# Patient Record
Sex: Female | Born: 1970
Health system: Southern US, Community
[De-identification: ages and names within clinical notes are randomized; demographics above are authoritative.]

## PROBLEM LIST (undated history)

## (undated) DIAGNOSIS — K645 Perianal venous thrombosis: Secondary | ICD-10-CM

## (undated) DIAGNOSIS — E785 Hyperlipidemia, unspecified: Secondary | ICD-10-CM

## (undated) DIAGNOSIS — M1990 Unspecified osteoarthritis, unspecified site: Secondary | ICD-10-CM

## (undated) DIAGNOSIS — E559 Vitamin D deficiency, unspecified: Principal | ICD-10-CM

## (undated) DIAGNOSIS — K601 Chronic anal fissure: Secondary | ICD-10-CM

## (undated) DIAGNOSIS — E039 Hypothyroidism, unspecified: Secondary | ICD-10-CM

## (undated) DIAGNOSIS — K589 Irritable bowel syndrome without diarrhea: Secondary | ICD-10-CM

## (undated) DIAGNOSIS — N3010 Interstitial cystitis (chronic) without hematuria: Secondary | ICD-10-CM

## (undated) DIAGNOSIS — K5901 Slow transit constipation: Secondary | ICD-10-CM

## (undated) DIAGNOSIS — T7840XA Allergy, unspecified, initial encounter: Secondary | ICD-10-CM

## (undated) DIAGNOSIS — T4145XA Adverse effect of unspecified anesthetic, initial encounter: Secondary | ICD-10-CM

## (undated) DIAGNOSIS — E161 Other hypoglycemia: Secondary | ICD-10-CM

## (undated) DIAGNOSIS — D3130 Benign neoplasm of unspecified choroid: Secondary | ICD-10-CM

## (undated) DIAGNOSIS — G43909 Migraine, unspecified, not intractable, without status migrainosus: Secondary | ICD-10-CM

## (undated) DIAGNOSIS — K523 Indeterminate colitis: Secondary | ICD-10-CM

## (undated) DIAGNOSIS — H3530 Unspecified macular degeneration: Secondary | ICD-10-CM

## (undated) DIAGNOSIS — K219 Gastro-esophageal reflux disease without esophagitis: Secondary | ICD-10-CM

## (undated) DIAGNOSIS — D509 Iron deficiency anemia, unspecified: Secondary | ICD-10-CM

## (undated) DIAGNOSIS — F419 Anxiety disorder, unspecified: Secondary | ICD-10-CM

## (undated) DIAGNOSIS — Z973 Presence of spectacles and contact lenses: Secondary | ICD-10-CM

## (undated) DIAGNOSIS — T8859XA Other complications of anesthesia, initial encounter: Secondary | ICD-10-CM

## (undated) DIAGNOSIS — K5902 Outlet dysfunction constipation: Secondary | ICD-10-CM

## (undated) HISTORY — DX: Benign neoplasm of unspecified choroid: D31.30

## (undated) HISTORY — DX: Allergy, unspecified, initial encounter: T78.40XA

## (undated) HISTORY — DX: Interstitial cystitis (chronic) without hematuria: N30.10

## (undated) HISTORY — DX: Irritable bowel syndrome, unspecified: K58.9

## (undated) HISTORY — DX: Hypothyroidism, unspecified: E03.9

## (undated) HISTORY — DX: Indeterminate colitis: K52.3

## (undated) HISTORY — DX: Outlet dysfunction constipation: K59.02

## (undated) HISTORY — DX: Unspecified macular degeneration: H35.30

## (undated) HISTORY — DX: Migraine, unspecified, not intractable, without status migrainosus: G43.909

## (undated) HISTORY — DX: Vitamin D deficiency, unspecified: E55.9

## (undated) HISTORY — DX: Anxiety disorder, unspecified: F41.9

## (undated) HISTORY — DX: Hyperlipidemia, unspecified: E78.5

## (undated) HISTORY — DX: Iron deficiency anemia, unspecified: D50.9

## (undated) HISTORY — DX: Other hypoglycemia: E16.1

## (undated) HISTORY — DX: Perianal venous thrombosis: K64.5

## (undated) HISTORY — DX: Slow transit constipation: K59.01

---

## 2000-12-29 HISTORY — PX: DILATION AND EVACUATION: SHX1459

## 2001-02-09 ENCOUNTER — Ambulatory Visit (HOSPITAL_COMMUNITY): Admission: RE | Admit: 2001-02-09 | Discharge: 2001-02-09 | Payer: Self-pay | Admitting: *Deleted

## 2001-02-13 ENCOUNTER — Encounter (INDEPENDENT_AMBULATORY_CARE_PROVIDER_SITE_OTHER): Payer: Self-pay

## 2001-02-13 ENCOUNTER — Ambulatory Visit (HOSPITAL_COMMUNITY): Admission: RE | Admit: 2001-02-13 | Discharge: 2001-02-13 | Payer: Self-pay | Admitting: *Deleted

## 2001-07-06 ENCOUNTER — Other Ambulatory Visit: Admission: RE | Admit: 2001-07-06 | Discharge: 2001-07-06 | Payer: Self-pay | Admitting: *Deleted

## 2001-11-05 ENCOUNTER — Ambulatory Visit (HOSPITAL_COMMUNITY): Admission: RE | Admit: 2001-11-05 | Discharge: 2001-11-05 | Payer: Self-pay | Admitting: *Deleted

## 2002-01-27 ENCOUNTER — Inpatient Hospital Stay (HOSPITAL_COMMUNITY): Admission: AD | Admit: 2002-01-27 | Discharge: 2002-01-29 | Payer: Self-pay | Admitting: *Deleted

## 2002-03-07 ENCOUNTER — Other Ambulatory Visit: Admission: RE | Admit: 2002-03-07 | Discharge: 2002-03-07 | Payer: Self-pay | Admitting: *Deleted

## 2002-04-26 ENCOUNTER — Encounter: Payer: Self-pay | Admitting: Family Medicine

## 2002-04-26 ENCOUNTER — Encounter: Admission: RE | Admit: 2002-04-26 | Discharge: 2002-04-26 | Payer: Self-pay | Admitting: Family Medicine

## 2003-03-21 ENCOUNTER — Other Ambulatory Visit: Admission: RE | Admit: 2003-03-21 | Discharge: 2003-03-21 | Payer: Self-pay | Admitting: *Deleted

## 2004-04-18 ENCOUNTER — Other Ambulatory Visit: Admission: RE | Admit: 2004-04-18 | Discharge: 2004-04-18 | Payer: Self-pay | Admitting: *Deleted

## 2005-07-28 ENCOUNTER — Other Ambulatory Visit: Admission: RE | Admit: 2005-07-28 | Discharge: 2005-07-28 | Payer: Self-pay | Admitting: Obstetrics and Gynecology

## 2005-08-20 ENCOUNTER — Emergency Department (HOSPITAL_COMMUNITY): Admission: EM | Admit: 2005-08-20 | Discharge: 2005-08-20 | Payer: Self-pay | Admitting: Family Medicine

## 2005-08-21 ENCOUNTER — Emergency Department (HOSPITAL_COMMUNITY): Admission: EM | Admit: 2005-08-21 | Discharge: 2005-08-21 | Payer: Self-pay | Admitting: Family Medicine

## 2005-08-25 ENCOUNTER — Ambulatory Visit (HOSPITAL_BASED_OUTPATIENT_CLINIC_OR_DEPARTMENT_OTHER): Admission: RE | Admit: 2005-08-25 | Discharge: 2005-08-25 | Payer: Self-pay | Admitting: *Deleted

## 2005-08-25 ENCOUNTER — Ambulatory Visit (HOSPITAL_COMMUNITY): Admission: RE | Admit: 2005-08-25 | Discharge: 2005-08-25 | Payer: Self-pay | Admitting: *Deleted

## 2005-08-25 HISTORY — PX: OTHER SURGICAL HISTORY: SHX169

## 2006-01-27 ENCOUNTER — Ambulatory Visit: Payer: Self-pay | Admitting: Obstetrics & Gynecology

## 2006-02-12 ENCOUNTER — Ambulatory Visit (HOSPITAL_BASED_OUTPATIENT_CLINIC_OR_DEPARTMENT_OTHER): Admission: RE | Admit: 2006-02-12 | Discharge: 2006-02-12 | Payer: Self-pay | Admitting: Orthopedic Surgery

## 2006-02-12 HISTORY — PX: CHEILECTOMY: SHX1336

## 2006-03-18 ENCOUNTER — Ambulatory Visit: Payer: Self-pay | Admitting: Obstetrics & Gynecology

## 2007-04-29 ENCOUNTER — Encounter: Payer: Self-pay | Admitting: Obstetrics & Gynecology

## 2007-04-29 ENCOUNTER — Ambulatory Visit: Payer: Self-pay | Admitting: Obstetrics & Gynecology

## 2008-04-18 ENCOUNTER — Encounter: Payer: Self-pay | Admitting: Obstetrics & Gynecology

## 2008-04-18 ENCOUNTER — Ambulatory Visit: Payer: Self-pay | Admitting: Obstetrics & Gynecology

## 2008-04-25 ENCOUNTER — Ambulatory Visit (HOSPITAL_COMMUNITY): Admission: RE | Admit: 2008-04-25 | Discharge: 2008-04-25 | Payer: Self-pay | Admitting: Gynecology

## 2008-07-06 ENCOUNTER — Ambulatory Visit: Payer: Self-pay | Admitting: Obstetrics & Gynecology

## 2009-10-31 ENCOUNTER — Encounter: Payer: Self-pay | Admitting: Obstetrics & Gynecology

## 2009-10-31 ENCOUNTER — Ambulatory Visit: Payer: Self-pay | Admitting: Obstetrics & Gynecology

## 2009-11-05 ENCOUNTER — Ambulatory Visit (HOSPITAL_COMMUNITY): Admission: RE | Admit: 2009-11-05 | Discharge: 2009-11-05 | Payer: Self-pay | Admitting: Obstetrics & Gynecology

## 2009-11-13 ENCOUNTER — Ambulatory Visit: Payer: Self-pay | Admitting: Obstetrics & Gynecology

## 2009-11-28 ENCOUNTER — Ambulatory Visit: Payer: Self-pay | Admitting: Obstetrics & Gynecology

## 2009-12-03 ENCOUNTER — Ambulatory Visit (HOSPITAL_COMMUNITY): Payer: Self-pay | Admitting: Marriage and Family Therapist

## 2009-12-11 ENCOUNTER — Ambulatory Visit (HOSPITAL_COMMUNITY): Payer: Self-pay | Admitting: Marriage and Family Therapist

## 2009-12-14 ENCOUNTER — Ambulatory Visit: Payer: Self-pay | Admitting: Obstetrics & Gynecology

## 2009-12-20 ENCOUNTER — Ambulatory Visit (HOSPITAL_COMMUNITY): Payer: Self-pay | Admitting: Marriage and Family Therapist

## 2010-01-01 ENCOUNTER — Ambulatory Visit (HOSPITAL_COMMUNITY): Payer: Self-pay | Admitting: Marriage and Family Therapist

## 2010-02-05 ENCOUNTER — Ambulatory Visit (HOSPITAL_COMMUNITY): Payer: Self-pay | Admitting: Marriage and Family Therapist

## 2010-03-04 ENCOUNTER — Ambulatory Visit (HOSPITAL_COMMUNITY): Payer: Self-pay | Admitting: Marriage and Family Therapist

## 2010-03-19 ENCOUNTER — Ambulatory Visit (HOSPITAL_BASED_OUTPATIENT_CLINIC_OR_DEPARTMENT_OTHER): Admission: RE | Admit: 2010-03-19 | Discharge: 2010-03-19 | Payer: Self-pay | Admitting: Urology

## 2010-03-19 HISTORY — PX: OTHER SURGICAL HISTORY: SHX169

## 2010-04-16 ENCOUNTER — Encounter: Admission: RE | Admit: 2010-04-16 | Discharge: 2010-07-03 | Payer: Self-pay | Admitting: Urology

## 2010-04-23 ENCOUNTER — Ambulatory Visit: Payer: Self-pay | Admitting: Obstetrics & Gynecology

## 2010-04-24 ENCOUNTER — Encounter: Payer: Self-pay | Admitting: Obstetrics & Gynecology

## 2010-04-24 LAB — CONVERTED CEMR LAB
Chlamydia, DNA Probe: NEGATIVE
GC Probe Amp, Genital: NEGATIVE

## 2010-07-22 ENCOUNTER — Encounter: Payer: Self-pay | Admitting: Internal Medicine

## 2010-07-23 ENCOUNTER — Encounter: Payer: Self-pay | Admitting: Obstetrics & Gynecology

## 2010-09-11 ENCOUNTER — Ambulatory Visit: Payer: Self-pay | Admitting: Internal Medicine

## 2010-09-11 DIAGNOSIS — K589 Irritable bowel syndrome without diarrhea: Secondary | ICD-10-CM

## 2010-09-11 LAB — CONVERTED CEMR LAB
ALT: 51 units/L — ABNORMAL HIGH (ref 0–35)
AST: 32 units/L (ref 0–37)
Albumin: 3.9 g/dL (ref 3.5–5.2)
Alkaline Phosphatase: 65 units/L (ref 39–117)
BUN: 15 mg/dL (ref 6–23)
Basophils Absolute: 0.1 10*3/uL (ref 0.0–0.1)
Basophils Relative: 1.4 % (ref 0.0–3.0)
CO2: 26 meq/L (ref 19–32)
CRP, High Sensitivity: 0.4 (ref 0.00–5.00)
Calcium: 9.7 mg/dL (ref 8.4–10.5)
Chloride: 111 meq/L (ref 96–112)
Creatinine, Ser: 0.9 mg/dL (ref 0.4–1.2)
Eosinophils Absolute: 0.3 10*3/uL (ref 0.0–0.7)
Eosinophils Relative: 5.5 % — ABNORMAL HIGH (ref 0.0–5.0)
GFR calc non Af Amer: 72.67 mL/min (ref >60)
Glucose, Bld: 81 mg/dL (ref 70–99)
HCT: 34 % — ABNORMAL LOW (ref 36.0–46.0)
Hemoglobin: 11.9 g/dL — ABNORMAL LOW (ref 12.0–15.0)
Lymphocytes Relative: 37.6 % (ref 12.0–46.0)
Lymphs Abs: 1.8 10*3/uL (ref 0.7–4.0)
MCHC: 34.9 g/dL (ref 30.0–36.0)
MCV: 91.7 fL (ref 78.0–100.0)
Monocytes Absolute: 0.4 10*3/uL (ref 0.1–1.0)
Monocytes Relative: 8.7 % (ref 3.0–12.0)
Neutro Abs: 2.2 10*3/uL (ref 1.4–7.7)
Neutrophils Relative %: 46.8 % (ref 43.0–77.0)
Platelets: 147 10*3/uL — ABNORMAL LOW (ref 150.0–400.0)
Potassium: 4.2 meq/L (ref 3.5–5.1)
RBC: 3.71 M/uL — ABNORMAL LOW (ref 3.87–5.11)
RDW: 13.3 % (ref 11.5–14.6)
Sed Rate: 9 mm/hr (ref 0–22)
Sodium: 141 meq/L (ref 135–145)
Total Bilirubin: 0.9 mg/dL (ref 0.3–1.2)
Total Protein: 6.1 g/dL (ref 6.0–8.3)
WBC: 4.7 10*3/uL (ref 4.5–10.5)

## 2010-09-24 ENCOUNTER — Encounter: Payer: Self-pay | Admitting: Internal Medicine

## 2010-09-25 ENCOUNTER — Telehealth: Payer: Self-pay | Admitting: Internal Medicine

## 2010-09-25 ENCOUNTER — Encounter (INDEPENDENT_AMBULATORY_CARE_PROVIDER_SITE_OTHER): Payer: Self-pay | Admitting: *Deleted

## 2010-09-26 ENCOUNTER — Ambulatory Visit: Payer: Self-pay | Admitting: Internal Medicine

## 2010-10-02 ENCOUNTER — Ambulatory Visit: Payer: Self-pay | Admitting: Internal Medicine

## 2010-10-02 HISTORY — PX: COLONOSCOPY: SHX174

## 2010-10-03 ENCOUNTER — Encounter: Payer: Self-pay | Admitting: Internal Medicine

## 2010-10-08 ENCOUNTER — Encounter (INDEPENDENT_AMBULATORY_CARE_PROVIDER_SITE_OTHER): Payer: Self-pay | Admitting: *Deleted

## 2010-12-25 ENCOUNTER — Ambulatory Visit: Payer: Self-pay | Admitting: Internal Medicine

## 2011-01-28 NOTE — Letter (Signed)
Summary: New Patient letter  [facility]  [address] [place], [place] [address]   Phone: [phone]  Fax: [phone]       07/22/2010 MRN: [id number]  Shelly Wilkinson [address] [place], [place]  [address]  Dear Shelly Wilkinson,  Welcome to the Gastroenterology Division at [facility].    You are scheduled to see Dr.  Carlean Purl on 09-16-10 at 10am on the 3rd floor at [facility], [place] [employer].  We ask that you try to arrive at our office 15 minutes prior to your appointment time to allow for check-in.  We would like you to complete the enclosed self-administered evaluation form prior to your visit and bring it with you on the day of your appointment.  We will review it with you.  Also, please bring a complete list of all your medications or, if you prefer, bring the medication bottles and we will list them.  Please bring your insurance card so that we may make a copy of it.  If your insurance requires a referral to see a specialist, please bring your referral form from your primary care physician.  Co-payments are due at the time of your visit and may be paid by cash, check or credit card.     Your office visit will consist of a consult with your physician (includes a physical exam), any laboratory testing he/she may order, scheduling of any necessary diagnostic testing (e.g. x-ray, ultrasound, CT-scan), and scheduling of a procedure (e.g. Endoscopy, Colonoscopy) if required.  Please allow enough time on your schedule to allow for any/all of these possibilities.    If you cannot keep your appointment, please call [phone] to cancel or reschedule prior to your appointment date.  This allows [place] the opportunity to schedule an appointment for another patient in need of care.  If you do not cancel or reschedule by 5 p.m. the business day prior to your appointment date, you will be charged a $50.00 late cancellation/no-show fee.    Thank you for choosing  [facility] for your medical needs.  We appreciate the opportunity to care for you.  Please visit [place] at our website  to learn more about our practice.                     Sincerely,                                                             The Gastroenterology Division

## 2011-01-28 NOTE — Letter (Signed)
Summary: Appt Reminder [facility]  [place], [place] [address]   Phone: [phone]  Fax: [phone]        October 08, 2010 MRN: [id number]    Darleene Hyslop [address] [place], [place]  [address]    Dear Ms. Alcaraz,   You have a return appointment with Dr. Carlean Purl on November 11, 2010 at 9:30 am.  Please remember to bring a complete list of the medicines you are taking, your insurance card and your co-pay.  If you have to cancel or reschedule this appointment, please call before 5:00 pm the evening before to avoid a cancellation fee.  If you have any questions or concerns, please call [phone].    Sincerely,    Abelino Derrick CMA Deborra Medina)  Appended Document: Appt Reminder 2 printed and mailed on 10/08/10

## 2011-01-28 NOTE — Letter (Signed)
Summary: [facility] Instructions  [facility]  [place], [place] [address]   Phone: [phone]  Fax: [phone]       Sada Hornberger    [birth year]    MRN: [id number]        Procedure Day Sudie Grumbling:  [facility]  10/02/10     Arrival Time:  8:30AM     Procedure Time:  9:30AM     Location of Procedure:                    [facility] _  [place] (4th Floor)                     [place]   Starting 5 days prior to your procedure 09/27/10 do not eat nuts, seeds, popcorn, corn, beans, peas,  salads, or any raw vegetables.  Do not take any fiber supplements (e.g. Metamucil, Citrucel, and Benefiber).  THE DAY BEFORE YOUR PROCEDURE         DATE: 10/01/10  DAY: TUESDAY  1.  Drink clear liquids the entire day-NO SOLID FOOD  2.  Do not drink anything colored red or purple.  Avoid juices with pulp.  No orange juice.  3.  Drink at least 64 oz. (8 glasses) of fluid/clear liquids during the day to prevent dehydration and help the prep work efficiently.  CLEAR LIQUIDS INCLUDE: Water Jello Ice Popsicles Tea (sugar ok, no milk/cream) Powdered fruit flavored drinks Coffee (sugar ok, no milk/cream) Gatorade Juice: apple, white grape, white cranberry  Lemonade Clear bullion, consomm, broth Carbonated beverages (any kind) Strained chicken noodle soup Hard Candy                             4.  In the morning, mix first dose of MoviPrep solution:    Empty 1 Pouch A and 1 Pouch B into the disposable container    Add lukewarm drinking water to the top line of the container. Mix to dissolve    Refrigerate (mixed solution should be used within 24 hrs)  5.  Begin drinking the prep at 5:00 p.m. The MoviPrep container is divided by 4 marks.   Every 15 minutes drink the solution down to the next mark (approximately 8 oz) until the full liter is complete.   6.  Follow completed prep with 16 oz of clear liquid of your choice (Nothing  red or purple).  Continue to drink clear liquids until bedtime.  7.  Before going to bed, mix second dose of MoviPrep solution:    Empty 1 Pouch A and 1 Pouch B into the disposable container    Add lukewarm drinking water to the top line of the container. Mix to dissolve    Refrigerate  THE DAY OF YOUR PROCEDURE      DATE: 10/02/10  DAY: WEDNESDAY  Beginning at 4:30AM (5 hours before procedure):         1. Every 15 minutes, drink the solution down to the next mark (approx 8 oz) until the full liter is complete.  2. Follow completed prep with 16 oz. of clear liquid of your choice.    3. You may drink clear liquids until 7:30AM (2 HOURS BEFORE PROCEDURE).   MEDICATION INSTRUCTIONS  Unless otherwise instructed, you should take regular prescription medications with a small sip of water   as early as possible the morning of your procedure.  OTHER INSTRUCTIONS  You will need a responsible adult at least 41 years of age to accompany you and drive you home.   This person must remain in the waiting room during your procedure.  Wear loose fitting clothing that is easily removed.  Leave jewelry and other valuables at home.  However, you may wish to bring a book to read or  an iPod/MP3 player to listen to music as you wait for your procedure to start.  Remove all body piercing jewelry and leave at home.  Total time from sign-in until discharge is approximately 2-3 hours.  You should go home directly after your procedure and rest.  You can resume normal activities the  day after your procedure.  The day of your procedure you should not:   Drive   Make legal decisions   Operate machinery   Drink alcohol   Return to work  You will receive specific instructions about eating, activities and medications before you leave.    The above instructions have been reviewed and explained to me by   Emerson Monte RN  September 26, 2010 9:12 AM     I fully understand  and can verbalize these instructions _____________________________ Date _________

## 2011-01-28 NOTE — Letter (Signed)
Summary: Patient Notice- Colon Biospy Results  [facility]  [address] [place], [place] [address]   Phone: [phone]  Fax: [phone]        October 03, 2010 MRN: [id number]    Shelly Wilkinson [address] [place],   [address]    Dear [name],  I am pleased to inform you that the biopsies taken during your recent colonoscopy did not show any evidence of colitis upon pathologic examination. I think you have IBS or Irritable Bowel Syndrome.  Please start taking a probiotic supplement called Align 1 each day (helps IBS) and you should consider stopping the magnesium supplement if the prescribing physician believes that to be ok (magnesium can cause diarrhea).  We can discuss this more when you return for follow-up in the office as recommended after your colonoscopy.  Please call us if you are having persistent problems or have questions about your condition that have not been fully answered at this time.  Sincerely,  Gatha Mayer MD, [facility]   This letter has been electronically signed by your physician.  Appended Document: Patient Notice- Colon Biospy Results letter mailed

## 2011-01-28 NOTE — Procedures (Signed)
Summary: Colonoscopy  Patient: Shelly Wilkinson Note: All result statuses are Final unless otherwise noted.  Tests: (1) Colonoscopy (COL)   COL Colonoscopy           [facility] [employer]     [place], [place]  [address]           COLONOSCOPY PROCEDURE REPORT           PATIENT:  [name], [name]  MR#:  [id number]     BIRTHDATE:  1971-08-17, 39 yrs. old  GENDER:  female     ENDOSCOPIST:  Gatha Mayer, MD, [facility]     REF. BY:  Silas Sacramento, M.D.     PROCEDURE DATE:  10/02/2010     PROCEDURE:  Colonoscopy with biopsy     ASA CLASS:  Class I     INDICATIONS:  unexplained diarrhea     MEDICATIONS:   Fentanyl 75 mcg IV, Versed 8 mg IV           DESCRIPTION OF PROCEDURE:   After the risks benefits and     alternatives of the procedure were thoroughly explained, informed     consent was obtained.  Digital rectal exam was performed and     revealed no abnormalities.   The LB [id number]-[id number] endoscope     was introduced through the anus and advanced to the terminal ileum     which was intubated for a short distance, without limitations. She     was moved supine and pressure applied to abdomen.  The quality of     the prep was excellent, using MoviPrep.  The instrument was then     slowly withdrawn as the colon was fully examined. Insertion 12:07     minutes Withdrawal: 9:28 minutes     <<PROCEDUREIMAGES>>           FINDINGS:  The terminal ileum appeared normal.  A normal appearing     cecum, ileocecal valve, and appendiceal orifice were identified.     The ascending, hepatic flexure, transverse, splenic flexure,     descending, sigmoid colon, and rectum appeared unremarkable.     Random biopsies were obtained and sent to pathology.   Retroflexed     views in the rectum revealed internal hemorrhoids.    The scope     was then withdrawn from the patient and the procedure completed.           COMPLICATIONS:  None     ENDOSCOPIC IMPRESSION:     1) Normal terminal  ileum     2) Normal colon     3) Internal hemorrhoids     RECOMMENDATIONS:     1) Await biopsy results     Patient to call Dr. Celesta Aver office this week and schedule a     return visit for early November.     REPEAT EXAM:  In for Colonoscopy, pending biopsy results.           Gatha Mayer, MD, [facility]           CC:  Silas Sacramento, MD     Kelton Pillar, MD     The Patient           n.     eSIGNED:   Gatha Mayer at 10/02/2010 11:00 AM           Faison, Leveda Anna, [id number]  Note: An exclamation mark (!) indicates  a result that was not dispersed into the flowsheet. Document Creation Date: 10/02/2010 11:01 AM _______________________________________________________________________  (1) Order result status: Final Collection or observation date-time: 10/02/2010 10:46 Requested date-time:  Receipt date-time:  Reported date-time:  Referring Physician:   Ordering Physician: Silvano Rusk [phone]) Specimen Source:  Source: Tawanna Cooler Order Number: [id number] Lab site:   Appended Document: Colonoscopy     Procedures Next Due Date:    Colonoscopy: 09/2020

## 2011-01-28 NOTE — Progress Notes (Signed)
Summary: Triage  Phone Note Call from Patient Call back at Home Phone [phone] Call back at Work Phone [phone]   Caller: Patient Call For: Dr. Carlean Purl Reason for Call: Talk to Nurse Summary of Call: pt. had a BM and has some blood in stool Initial call taken by: Webb Laws,  September 25, 2010 3:07 PM  Follow-up for Phone Call        Pt. was seen 09-11-10 and has a Colonoscopy scheduled for 11-18-10. Pt. callling to state she had, "Blood running through my stool" also states the stool was  mushy. Denies rectal pain, burning, itching,  abd. pain, fever, n/v.  Pt. states her father has Colitis. Also, pt. states she had Camplobactor in the 90's, wants to know if that could be the problem? Pt. has rescheduled her Colonoscopy to 10-02-10 at 9:30am.   DR.GESSNER-Any further orders until procedure?  Follow-up by: Vivia Ewing LPN,  September 25, 7680 4:11 PM  Additional Follow-up for Phone Call Additional follow up Details #1::        do not think it is an infection am suspecting IBS with anorectal bleeding vs. IBD if this worsens or does not resolve, call back Additional Follow-up by: Gatha Mayer MD, [facility],  September 25, 2010 4:37 PM    Additional Follow-up for Phone Call Additional follow up Details #2::    Above MD orders reviewed with patient. Pt. to keep scheduled procedure and callback as needed.  Follow-up by: Vivia Ewing LPN,  September 26, 1571 4:43 PM

## 2011-01-28 NOTE — Letter (Signed)
Summary: Pre Visit Letter Revised  [facility]  [place], [place] [address]   Phone: [phone]  Fax: [phone]        09/24/2010 MRN: [id number] [place] [address] [place], [place]  [address]             Procedure Date:  11-21 at 8:30am   Welcome to the Gastroenterology Division at [facility].    You are scheduled to see a nurse for your pre-procedure visit on  10-04-10 at 1pm on the 3rd floor at [facility], [place] [employer].  We ask that you try to arrive at our office 15 minutes prior to your appointment time to allow for check-in.  Please take a minute to review the attached form.  If you answer "Yes" to one or more of the questions on the first page, we ask that you call the person listed at your earliest opportunity.  If you answer "No" to all of the questions, please complete the rest of the form and bring it to your appointment.    Your nurse visit will consist of discussing your medical and surgical history, your immediate family medical history, and your medications.   If you are unable to list all of your medications on the form, please bring the medication bottles to your appointment and we will list them.  We will need to be aware of both prescribed and over the counter drugs.  We will need to know exact dosage information as well.    Please be prepared to read and sign documents such as consent forms, a financial agreement, and acknowledgement forms.  If necessary, and with your consent, a friend or relative is welcome to sit-in on the nurse visit with you.  Please bring your insurance card so that we may make a copy of it.  If your insurance requires a referral to see a specialist, please bring your referral form from your primary care physician.  No co-pay is required for this nurse visit.     If you cannot keep your appointment, please call [phone] to cancel or reschedule prior to your appointment date.  This  allows [place] the opportunity to schedule an appointment for another patient in need of care.    Thank you for choosing [facility] for your medical needs.  We appreciate the opportunity to care for you.  Please visit [place] at our website  to learn more about our practice.  Sincerely, The Gastroenterology Division

## 2011-01-28 NOTE — Assessment & Plan Note (Signed)
Summary: PERSISTENT DIARRHEA/YF   History of Present Illness Visit Type: Initial Consult Primary GI MD: Silvano Rusk MD [facility] Primary Jamear Carbonneau: Kelton Pillar, MD Requesting Francisca Harbuck: Silas Sacramento, MD Chief Complaint: Chronic diarrhea History of Present Illness:   40 yo ww with diarrhea since May 2011. Was diagnosed with interstial cystitis then and was also stressed about a trip at that time. She had painful bloating and gas with tenesmus and then eventually explosive diarrhea. This was episodic and also occurred just before menses.  Substantial improvement off dairy products. Stools are now variable, hard or mushy with white material intermixed. She also gets uncomfortable after she eats.  + incomlete defecation She  tried Culturelle but inconsistently. Has gained 7# since stopping Wellbutrin in June In general she has been constipated over the years and had some abdominal upset. Says TSH ok in May (Dr. Buddy Duty)   GI Review of Systems    Reports acid reflux, bloating, and  weight gain.      Denies abdominal pain, belching, chest pain, dysphagia with liquids, dysphagia with solids, heartburn, loss of appetite, nausea, vomiting, vomiting blood, and  weight loss.      Reports change in bowel habits, diarrhea, and  rectal pain.     Denies anal fissure, black tarry stools, constipation, diverticulosis, fecal incontinence, heme positive stool, hemorrhoids, irritable bowel syndrome, jaundice, light color stool, liver problems, and  rectal bleeding. Preventive Screening-Counseling & Management  Alcohol-Tobacco     Smoking Status: never      Drug Use:  no.      Current Medications (verified): 1)  Synthroid 88 Mcg Tabs (Levothyroxine Sodium) .... Every Other Day 2)  Synthroid 75 Mcg Tabs (Levothyroxine Sodium) .... Every Other Day 3)  Gabapentin 600 Mg Tabs (Gabapentin) .... Take 1 1/2 Tablets Two Times A Day 4)  Topiramate 100 Mg Tabs (Topiramate) .... Take 1 1/2 Tablets Daily 5)   Migrelief 200-180-50 Mg Tabs (Riboflavin-Magnesium-Feverfew) .... Once Daily 6)  Magnesium 250 Mg Tabs (Magnesium) .... Once Daily 7)  Elmiron 100 Mg Caps (Pentosan Polysulfate Sodium) .... Take 2 Capsule By Mouth Two Times A Day 8)  Doxycycline Hyclate 100 Mg Solr (Doxycycline Hyclate) .... Once Daily  Allergies (verified): 1)  ! Sulfa  Past History:  Past Medical History: Chronic Headaches Hypothyroidism Interstitial Cystitis Acne vulgaris  Past Surgical History: Foot surgery Hand surgery  Family History: Family History of Colon Cancer:Aunt Family History of Colitis/Crohn's:Father Family History of Colon Polyps:GF Family History of Heart Disease: Father  Social History: Occupation: Tutor Patient has never smoked.  Alcohol Use - yes Daily Caffeine Use Illicit Drug Use - no Smoking Status:  never Drug Use:  no  Review of Systems       The patient complains of headaches-new.         All other ROS negative except as per HPI.   Vital Signs:  Patient profile:   40 year old female Height:      62.5 inches Weight:      127.25 pounds BMI:     22.99 Pulse rate:   56 / minute Pulse rhythm:   regular BP sitting:   90 / 60  (left arm) Cuff size:   regular  Vitals Entered By: June McMurray Clarion Deborra Medina) (September 11, 2010 1:38 PM)  Physical Exam  General:  Well developed, well nourished, no acute distress. Eyes:  PERRLA, no icterus. Mouth:  No deformity or lesions, dentition normal. Neck:  Supple; no masses or thyromegaly. Lungs:  Clear  throughout to auscultation. Heart:  Regular rate and rhythm; no murmurs, rubs,  or bruits. Abdomen:  soft with diffuse soreness and mild tenderness BS+ no HSM/mass Rectal:  deferred until time of colonoscopy.   Extremities:  No clubbing, cyanosis, edema or deformities noted. Neurologic:  Alert and  oriented x3 Cervical Nodes:  No significant cervical or supraclavicular adenopathy.  Psych:  Alert and cooperative. Normal mood and  affect.   Impression & Recommendations:  Problem # 1:  DIARRHEA, CHRONIC (ICD-787.91) Assessment New She has had problems x months, now with alternating hard and soft stools fhx IBD (dad) + Interstitial Cystitis which increases likelihood of IBS Suspect IBS but she may have IBD ? effect of doxycyclie but probably not playing a role  C diff toxin negative. She says stool Cx grew yeast.  Orders: TLB-CBC Platelet - w/Differential (85025-CBCD) TLB-CMP (Comprehensive Metabolic Pnl) (38101-BPZW) TLB-CRP-High Sensitivity (C-Reactive Protein) (86140-FCRP) TLB-Sedimentation Rate (ESR) (85652-ESR)  Colonoscopy with possibble random biopsies will be scheduled. Hopeful terminal ileal intubation. Risks, benefits,and indications of endoscopic procedure(s) were reviewed with the patient and all questions answered.  Patient Instructions: 1)  Please go to the basement to have your lab tests drawn today.  2)  We will call you with further follow up after reviewing these results. 3)  Please keep a food diary to see if there is any relationship between foods you are eating and your symptoms. 4)  Please begin taking a packet or capful of Benefiber daily. 5)  Please call to schedule your Colonoscopy.  You can ask for [facility]. 6)  Copy sent to : Silas Sacramento, MD; Kelton Pillar, MD 7)  The medication list was reviewed and reconciled.  All changed / newly prescribed medications were explained.  A complete medication list was provided to the patient / caregiver.

## 2011-01-28 NOTE — Miscellaneous (Signed)
Summary: [facility] Previsit/prep  Clinical Lists Changes  Medications: Added new medication of MOVIPREP 100 GM  SOLR (PEG-KCL-NACL-NASULF-Carina Chaplin ASC-C) As per prep instructions. - Signed Rx of MOVIPREP 100 GM  SOLR (PEG-KCL-NACL-NASULF-Jahmere Bramel ASC-C) As per prep instructions.;  #1 x 0;  Signed;  Entered by: Emerson Monte RN;  Authorized by: Ladene Artist MD [facility];  Method used: Electronically to [employer] [facility]. [place]. [id number]*, [address], [place], [place], [place]  [address], Ph: [phone] or [phone], Fax: [phone] Allergies: Changed allergy or adverse reaction from SULFA to SULFA    Prescriptions: MOVIPREP 100 GM  SOLR (PEG-KCL-NACL-NASULF-Chigozie Basaldua ASC-C) As per prep instructions.  #1 x 0   Entered by:   Emerson Monte RN   Authorized by:   Ladene Artist MD [facility]   Signed by:   Emerson Monte RN on 09/26/2010   Method used:   Electronically to        [facility]. [address] [phone]* (retail)       [address]       [place], [place]  [address]       Ph: [phone] or [phone]       Fax: [phone]   RxID:   [id number]

## 2011-01-30 NOTE — Assessment & Plan Note (Signed)
Summary: FOLLOW UP COLON//SP   History of Present Illness Visit Type: Follow-up Visit Primary GI MD: Silvano Rusk MD [facility] Primary Provider: Kelton Pillar, MD Requesting Provider: Silas Sacramento, MD Chief Complaint: Follow up from Colonoscopy, Pt states she feels better now, has been taking Align daily History of Present Illness:   40 yo ww with IBS - colonoscopy did not show any colitis. She has done well on align with resolution of most symptoms. She has stopped milk but tolerates yogurt and cheese.    GI Review of Systems      Denies abdominal pain, acid reflux, belching, bloating, chest pain, dysphagia with liquids, dysphagia with solids, heartburn, loss of appetite, nausea, vomiting, vomiting blood, weight loss, and  weight gain.        Denies anal fissure, black tarry stools, change in bowel habit, constipation, diarrhea, diverticulosis, fecal incontinence, heme positive stool, hemorrhoids, irritable bowel syndrome, jaundice, light color stool, liver problems, rectal bleeding, and  rectal pain.    Colonoscopy  Procedure date:  10/02/2010  Findings:          1) Normal terminal ileum     2) Normal colon with NORMAL BIOPSIES     3) Internal hemorrhoids  Comments:      Repeat colonoscopy in 10 years.     Current Medications (verified): 1)  Synthroid 88 Mcg Tabs (Levothyroxine Sodium) .... Every Other Day 2)  Synthroid 75 Mcg Tabs (Levothyroxine Sodium) .... Every Other Day 3)  Gabapentin 800 Mg Tabs (Gabapentin) .Marland Kitchen.. 1 By Mouth Every Morning and 2 At Night 4)  Migrelief 200-180-50 Mg Tabs (Riboflavin-Magnesium-Feverfew) .... Once Daily 5)  Magnesium 250 Mg Tabs (Magnesium) .... Once Daily 6)  Elmiron 100 Mg Caps (Pentosan Polysulfate Sodium) .... Take 2 Capsule By Mouth Two Times A Day 7)  B-2 100 Mg Tabs (Riboflavin) .Marland Kitchen.. 1 By Mouth Once Daily 8)  Align  Caps (Probiotic Product) .Marland Kitchen.. 1 By Mouth Once Daily  Allergies (verified): 1)  ! Sulfa  Past History:  Past  Surgical History: Last updated: 09/11/2010 Foot surgery Hand surgery  Past Medical History: Chronic Headaches Hypothyroidism Interstitial Cystitis Acne vulgaris IBS  Family History: Reviewed history from 09/11/2010 and no changes required. Family History of Colon Cancer:Aunt Family History of Colitis/Crohn's:Father Family History of Colon Polyps:GF Family History of Heart Disease: Father  Social History: Reviewed history from 09/11/2010 and no changes required. Occupation: Writer Patient has never smoked.  Alcohol Use - yes Daily Caffeine Use Illicit Drug Use - no  Vital Signs:  Patient profile:   40 year old female Height:      62.5 inches Weight:      132 pounds BMI:     23.84 BSA:     1.61 Pulse rate:   60 / minute Pulse rhythm:   regular BP sitting:   92 / 62  (left arm)  Vitals Entered By: Sula Deborra Medina) (December 25, 2010 3:58 PM)  Physical Exam  General:  Well developed, well nourished, no acute distress.   Impression & Recommendations:  Problem # 1:  IRRITABLE BOWEL SYNDROME (ICD-564.1) Assessment Improved She will continue Align and dietary modification that is working well. f/U as needed She will consider trying Lactaid supplementation to see if that corrects perceived milk intolerance.   Patient Instructions: 1)  Please schedule a follow-up appointment as needed.  2)  You may try Lactaid to see if it helps some of your symptoms. We have given you a handout to look over regarding  this. 3)  The medication list was reviewed and reconciled.  All changed / newly prescribed medications were explained.  A complete medication list was provided to the patient / caregiver. cc: Kelton Pillar, MD and Silas Sacramento, MD

## 2011-03-24 LAB — POCT HEMOGLOBIN-HEMACUE: Hemoglobin: 12.6 g/dL (ref 12.0–15.0)

## 2011-03-24 LAB — POCT PREGNANCY, URINE: Preg Test, Ur: NEGATIVE

## 2011-03-31 LAB — POCT PREGNANCY, URINE: Preg Test, Ur: NEGATIVE

## 2011-05-13 NOTE — Assessment & Plan Note (Signed)
NAME:  YONEKO, TALERICO NO.:  [id number]   MEDICAL RECORD NO.:  [id number]          PATIENT TYPE:  POB   LOCATION:  [place] at [place]:  [facility]   PHYSICIAN:  Silas Sacramento, MD      DATE OF BIRTH:  10/19/1971   DATE OF SERVICE:  10/31/2009                                  CLINIC NOTE   The patient is a 40 year old female who is here for her yearly exam.  She is also complaining of irregular cycles, getting heavier with  stopping and restarting, and bleeding after intercourse.   PAST MEDICAL HISTORY:  Migraines, depression, arthritis, high  cholesterol, hypothyroidism.   SURGERIES:  None.   GYN HISTORY:  Denies abnormal Pap smears, ovarian cysts, fibroid tumors,  or sexually transmitted diseases.   OBSTETRICAL HISTORY:  NSVD x2 with largest baby being 8 pounds 15  ounces, one miscarriage.   CONTRACEPTIVE HISTORY:  The patient has been on birth control in the  past and the patient's husband has had a vasectomy.   FAMILY HISTORY:  Father has diabetes and heart disease, heart attack.  Grandfather has lung cancer, leukemia, and has colon cancer that  metastasized to the liver.  Grandmother has rheumatoid arthritis.   SOCIAL HISTORY:  The patient lives with her husband and two sons, one of  her son has Tourette syndrome and possibly OCD and ADD, this is  providing a lot of stress in her life since her marriage.  She does not  work outside the home anymore.  She does not smoke.  She does not drink  alcohol.  She does not drink caffeinated beverages.  She is sexually  active with one partner.  She has not been a victim of sexual or  physical abuse.   MEDICATIONS:  Topamax, Neurontin, Wellbutrin, Migraleve, sodium bicarb,  and Synthroid.  The sodium bicarb is for side effects of the Topamax.   ALLERGY:  SULFA causing hives.   The patient has some other complaints today.  1. Irregular cycles.  The patient has some spotting each month that  stops and then restarts, then stops and then gets heavy, and stops      and gets heavy again.  Her period lasts about 8 days.  Sometimes      she has spotting between periods and intercourse.  This has      happened in the past, but it seems to be getting worse.  Her      periods can last up to 10 days.  2. The patient is having frequent urges to void.  She also tells that      she does not completely empty her bladder.  Pelvic exams does cause      large sensation to void.  She does not leak urine with coughing,      sneezing, or jumping rope or exercise.  3. The patient has early satiety and pain after eating.  She does not      have association with dairy products.  She does not eat large fatty      meals so, she does not know if it occurs if she eats fatty foods.  The patient used to have bad constipation; however, now she feels      like she is having looser stools, but sometimes take the shape of      the container, they mostly hold their own shape.  She is having      multiple bowel movements today which is also new for her.  I      suggest that she see a gastroenterologist but she will start with      her primary care physician who may refer her on.  She does have      colon cancer in her aunt, but no first-degree relatives.  4. The patient is having some anxiety involving her child having      Tourette and some behavioral problems at school with her son.  She      and her husband do not always get along with disciplining the son.      She is going to start marriage counseling, I think this would be      beneficial for both, her anxiety and also may be some of her GI      concerns.   PHYSICAL EXAMINATION:  VITAL SIGNS:  Pulse 73, blood pressure 93/62,  weight 119, height 62.5 inches.  GENERAL:  A well-nourished, well-developed, in no apparent distress.  HEENT:  Normocephalic, atraumatic.  Good dentition.  Thyroid no masses.  LUNGS:  Clear to auscultation bilaterally.   HEART:  Regular rate and rhythm.  BREASTS:  No masses, nontender.  No lymphadenopathy.  ABDOMEN:  Soft, nontender.  No organomegaly.  No hernia.  GENITALIA:  Tanner V.  Vagina pink.  Normal rugae.  Mild cystocele.  Cervix closed, nontender, mild prolapse of the uterus.  The uterus is  anteverted and nontender.  Adnexa; no masses, nontender.  No rectocele.  The patient bled with Pap smear.  EXTREMITIES:  Nontender.   ASSESSMENT AND PLAN:  A 40 year old female:  1. Pap smear with HPV.  2. Transvaginal ultrasound to evaluate irregular bleeding and schedule      the patient for endometrial biopsy.  3. Referred to Dr. Bjorn Loser at [facility] for      urodynamics and evaluation for frequent urination.  UA today is      negative, will send culture.  4. Follow up with primary care physician for GI concerns.  5. Mammogram due next year.  6. Follow up with counselor for marital concerns and anxiety.           ______________________________  Silas Sacramento, MD     KL/MEDQ  D:  10/31/2009  T:  11/01/2009  Job:  [id number]

## 2011-05-13 NOTE — Assessment & Plan Note (Signed)
NAME:  Shelly Wilkinson, Shelly Wilkinson NO.:  [id number]   MEDICAL RECORD NO.:  [id number]          PATIENT TYPE:  POB   LOCATION:  [place] at [place]:  [facility]   PHYSICIAN:  Silas Sacramento, MD      DATE OF BIRTH:  [birth year]   DATE OF SERVICE:  04/23/2010                                  CLINIC NOTE   The patient is a 40 year old female who presents for postcoital  bleeding.  The patient had had previous cryo for postcoital bleeding,  which she did well for several months.  The patient had her period on  April 16, 2010, and after 2-1/2 days, she had 3 episodes of heavy  postcoital bleeding where she bled heavily during sex and after sex but  would not bleed during the day.  She had sex for 3 nights in a row and  bled heavily, would not bleed during the day.  I explained to the  patient this could just been with the prostaglandin release and the  contraction of the uterus during orgasms, this would be when the blood  was released most likely from the uterus.  By the time of arrival, this  was some of the same symptoms that the patient was having before.  We  did cultures for GC/Chlamydia today, but the patient is on doxy for  acne.  I doubt there is low-grade endometritis.  We also discussed that  if the patient is going to have sex this week when the patient is not on  her menses and that the bleeding returns, then most likely this is the  return of her postcoital bleeding.  Today on exam, the patient bled  briskly from insertion of the GC/Chlamydia prep.  We talked about what  we could possibly do about this, and we could do a hydrothermal ablation  and a LEEP given that the cryo does not work.  The risk of the procedure  was explained, bleeding, infection, damage to the back of the uterus,  and vaginal burn.  The patient is no more childbearing as her husband  has had a vasectomy.  I would like to hear from the patient to see if  she bleeds after sex this  week.           ______________________________  Silas Sacramento, MD     KL/MEDQ  D:  04/23/2010  T:  04/24/2010  Job:  [id number]

## 2011-05-13 NOTE — Assessment & Plan Note (Signed)
NAME:  Shelly Wilkinson, Shelly Wilkinson NO.:  [id number]   MEDICAL RECORD NO.:  [id number]          PATIENT TYPE:  POB   LOCATION:  [place] at [place]:  [facility]   PHYSICIAN:  Silas Sacramento, MD      DATE OF BIRTH:  21-May-1971   DATE OF SERVICE:  11/13/2009                                  CLINIC NOTE   The patient is a 40 year old female who presents for abnormal uterine  bleeding.  She has a negative transvaginal ultrasound with a thin  endometrial stripe.  She still has abnormal bleeding.  I think it is  mostly postcoital bleeding from something and just inside the cervical  os, we would do an endometrial biopsy today just to make sure there is  nothing abnormal in the endometrium.  The patient was consented and has  a negative UPT.  The patient was placed in dorsal lithotomy position.  A  bivalve speculum was placed into the patient's vagina, and the cervical  os was brought into view.  The cervix was cleaned with Betadine, and the  anterior lip of the cervix was grasped with single-tooth tenaculum.  The  cervix sounded as 9 cm and 2 passes were made with a Pipelle with  adequate tissue.  Hemostasis was achieved with direct pressure and  silver nitrate.  The patient tolerated the procedure well.  We will call  the patient with the results and scheduled her at [facility] for cryotherapy of her cervix and hopefully just inside the  cervical os to help her postcoital bleeding.           ______________________________  Silas Sacramento, MD     KL/MEDQ  D:  11/13/2009  T:  11/14/2009  Job:  [id number]

## 2011-05-13 NOTE — Assessment & Plan Note (Signed)
NAME:  FELICITY, PENIX NO.:  [id number]   MEDICAL RECORD NO.:  [id number]          PATIENT TYPE:  POB   LOCATION:  [place] at [place]:  Silas Sacramento, MD      DATE OF BIRTH:  10/25/1971   DATE OF SERVICE:  04/18/2008                                  CLINIC NOTE   The patient is a 40 year old female who presents for her annual exam and  also complaining of bleeding after sex.  It has been happening  regularly, however, the past three times it has not.  She is also  complaining of intermittent yeast infections, and she recently took a  Diflucan and it felt much better   PAST MEDICAL HISTORY:  No changes.   PAST SURGICAL HISTORY:  No changes.   FAMILY HISTORY:  No changes.   MEDICATIONS:  Topamax, Synthroid and Yaz.   REVIEW OF SYSTEMS:  Positive for vaginal irritation that is improving  and bleeding with intercourse.  The patient does also have some urinary  urgency, but does not leak urine.  She does not feel that she wants to  get this evaluated at this time and I agree with that.   PHYSICAL EXAMINATION:  VITAL SIGNS:  Pulse 76, blood pressure 90/60,  weight 123, height 5 feet 2-1/2 inches.  GENERAL:  Well-nourished, well-developed in no apparent distress.  HEENT:  Normocephalic, atraumatic.  Good dentition.  THYROID:  No masses.  LUNGS:  Clear to auscultation bilaterally.  HEART:  Regular rate and rhythm.  BREASTS:  Nontender.  No masses, no lymphadenopathy.  ABDOMEN:  Soft, nontender.  No organomegaly, no hernia.  GENITALIA:  Tanner V.  Vagina pink, normal rugae.  ________ urge to  urinate on bimanual exam, however, can not really appreciate any large  cystocele or urethral mobility.  Uterus anteverted, nontender.  Adnexa  with no masses, nontender.  RECTAL:  No hemorrhoids.  The patient does have a little ectropion and  blood, a fair amount from just the Pap smear going at the os, both with  the brush and the  paddle.  I believe this is was is causing the bleeding  after sex.  However, we did a Pap smear today to rule out any  malignancy.  We will also do a transvaginal ultrasound to rule out any  other polyp in the endocervical canal or any uterine abnormality.   ASSESSMENT/PLAN:  A 40 year old female for Pap smear.  1. Pap smear done.  2. Transvaginal ultrasound or abnormal uterine bleeding.  3. Vitamin D level for lethargy.  4. Synthroid prescription given  5. Review test results with the patient over the phone.  6. Wet prep done.           ______________________________  Silas Sacramento, MD     KL/MEDQ  D:  04/18/2008  T:  04/18/2008  Job:  [id number]

## 2011-05-16 NOTE — Op Note (Signed)
Shelly Wilkinson, DINSE NO.:  [id number]   MEDICAL RECORD NO.:  [id number]          PATIENT TYPE:  AMB   LOCATION:  [place]                          FACILITY:  [facility]   PHYSICIAN:  Daryl Eastern, M.D.DATE OF BIRTH:  11/11/1971   DATE OF PROCEDURE:  08/25/2005  DATE OF DISCHARGE:                                 OPERATIVE REPORT   PREOP DIAGNOSIS:  Lacerated radial digital nerve, left ring finger.   POSTOP DIAGNOSIS:  Lacerated radial digital nerve, left ring finger.   PROCEDURE:  Repair of radial digital nerve, left ring finger.   SURGEON:  Daryl Eastern, M.D.   ANESTHESIA:  General.   OPERATIVE FINDINGS:  The patient had a complete transection of the radial  digital nerve in the palm to the left ring finger. The digital artery was  intact.   PROCEDURE:  Under general anesthesia with a tourniquet on the left arm, the  left hand was prepped and draped in usual fashion and after exsanguinating  the limb, the tourniquet was inflated to 250 mmHg. The sutures were removed  from the previous laceration and the laceration was extended in a zigzag  fashion. Blunt dissection was carried down to the neurovascular bundle. The  microscope was then brought in and the ends of the nerve were identified. It  was a relatively clean cut and so the nerve endings were not trimmed at all.  An epineurial repair was performed using 9-0 nylon suture in approximately  six sutures. The wound was then irrigated copiously with saline. The  microscope was taken off of the field. The skin was closed with 4-0 nylon  sutures and half percent Marcaine  was inserted for pain control. Sterile dressings were applied followed by a  Alumafoam splint. The patient had the tourniquet released with good  circulation of the hand. She went to the recovery room awake and stable in  good condition.      Daryl Eastern, M.D.  Electronically Signed     EMM/MEDQ  D:   08/25/2005  T:  08/25/2005  Job:  [id number]   cc:   Robyn Haber, M.D.  Fax: [place]. Laurann Montana, M.D.  [place] [place]  [place] [address]  Fax: [phone]

## 2011-05-16 NOTE — Assessment & Plan Note (Signed)
NAME:  DEVRI, KREHER NO.:  [id number]   MEDICAL RECORD NO.:  [id number]          PATIENT TYPE:  POB   LOCATION:  [place] at [place]:  Silas Sacramento, MD      DATE OF BIRTH:  02-Nov-1971   DATE OF SERVICE:                                  CLINIC NOTE   Patient has been seen at [facility] before.  Patient is a 40 year old gravida 3, para 2-0-1-2 who presents for annual  exam. She has complaints of vaginal irritation and some discomfort  during sex, it is only upon entry, it does not with deep penetration.  Patient thinks it might be a yeast infection. She has no complaints of  odor or itching. Otherwise well. Patient denies leakage of urine when  coughing or sneezing however she does urinate frequently and this has  been a chronic problem. __________ She has more than 1 episode of  urinating at night __________ this would be abnormal and might need to  be some behavioral modifications.   PAST MEDICAL HISTORY:  Arthritis in large toe, hypercholesterolemia, and  hypothyroidism.   PAST SURGICAL HISTORY:  None.   GYNECOLOGICAL HISTORY:  No abnormal pap smears, cysts, fibroids. History  of menstrual migraines and continue with Yaz to help this.   FAMILY HISTORY:  Father has diabetes, father has heart disease. Father  and grandmother had heart attack. Her father has height blood pressure.  She has multiple cancers in her family, lung, leukemia, colon, and  liver. Her grandmother also has rheumatoid arthritis and her father has  colitis.   SOCIAL HISTORY:  Patient does not smoke, drink, or do drugs. Occasional  caffeine beverage.   REVIEW OF SYSTEMS:  Positive for frequent headaches and discomfort with  intercourse which we addressed today.   ALLERGIES:  SULFA.   MEDICATIONS:  Synthroid, Topamax, and Yaz.   PHYSICAL EXAMINATION:  Blood pressure 107/66, pulse 62, weight 128.5.  GENERAL:  Well-nourished, well-developed no apparent distress.  HEENT: Normocephalic, atraumatic. Good dentition. Thyroid, no masses.  LUNGS: Clear to auscultation bilaterally.  HEART: Regular rate and rhythm.  BREASTS: Nontender. No masses. No lymphadenopathy or breast discharge.  ABDOMEN: Soft, nontender, nondistended. No hernia. No __________ . No  organomegaly.  GENITALIA: Tanner 5. Vagina pink, normal rugae, small amount of  discharge, does not look excoriated. Cervix closed, nontender. Uterus,  nontender, mobile. Adnexa, no masses, nontender. Perineum, intact.  Bladder and urethra were nontender with no evidence of cystocele.  EXTREMITIES: Nontender.   ASSESSMENT/PLAN:  A 40 year old with a well female exam.  1. Pap smear.  2. Wet prep shows maybe yeast, so I will treat with Diflucan      empirically.  3. Astroglide lubrication with intercourse.  4. Return to clinic in a year.           ______________________________  Silas Sacramento, MD     KL/MEDQ  D:  04/29/2007  T:  04/29/2007  Job:  [id number]

## 2011-05-16 NOTE — Group Therapy Note (Signed)
NAME:  Shelly Wilkinson, Shelly Wilkinson NO.:  [id number]   MEDICAL RECORD NO.:  [id number]          PATIENT TYPE:  WOC   LOCATION:  [facility]                   FACILITY:  [facility]   PHYSICIAN:  Silas Sacramento, MD      DATE OF BIRTH:  01-28-1971   DATE OF SERVICE:  01/27/2006                                    CLINIC NOTE   Patient is a 40 year old G3, P31-0-1-2 female, who is self-referred to me  from [facility].  The patient's main complaint today is birth  control and migraines.  The patient had a long-standing history of severe  migraines and is on multiple medications for this.  These include Effexor,  Topamax, Nabumetone/Relafen, Imitrex, Reglan, Phenergan, Relafen and she.  She  is also on fluconazole 200 mg once weekly for recurrent yeast.  She  also has been on a prednisone burst pack for her severe migraine/cluster  headaches.  The patient currently uses a progesterone-secreting IUD for her  birth control.  However, she has periods that are one to two weeks long that  are spotting and not heavy and, however, a nuisance.  She did have an  ultrasound that showed the IUD in place.  However, her stripe was 8 mm,  which I find to be slightly thickened given that she has a progesterone-  secreting IUD in place.  She has been on birth control pills in the past, as  prior to the birth of her first son more than six years ago.  She was not  ever on continuous birth control pills.  She has also tried adding a patch  near her period while on the IUD.  However, when she took the patch off, she  had a severe migraine.  The patient would like to try continuous OCPs and I  think a low-dose estrogen pill would be nice.  We are going to try Yasmin  and she is not going to take the placebo pills and will go on continuous  therapy.  Also of note, the patient complains of frequency of urination for  the past six months.  She also has a lot of urge during exams.  She was  given an  prescription for Detrol, but she has never taken it.  Also, if she  starts this medication at the same time as starting the Yasmin, it will be  unclear whether if any of these medications cause headaches.  At this point,  she has decided to start the Yasmin and hold off on the Detrol for another  six weeks given that she has already suffered from this problem for six  months.  I will send a urinalysis and a urine culture today.  We need to do  further urodynamics on the patient if the Detrol does not relieve her  symptoms.  We will do a complete H&P in the future at her next visit.  She  does not have any history of strokes or hypertension or blood clots that  would prohibit her from being on OCPs.   VITAL SIGNS:  Today temperature 97.5, pulse 77, blood pressure  112/76,  weight 102.5, height 5 feet 2 inches.   Return to clinic in six weeks.           ______________________________  Silas Sacramento, MD     KL/MEDQ  D:  01/27/2006  T:  01/27/2006  Job:  [id number]

## 2011-05-16 NOTE — Op Note (Signed)
NAMECHRISTINNA, SPRUNG NO.:  [id number]   MEDICAL RECORD NO.:  [id number]          PATIENT TYPE:  AMB   LOCATION:  [place]                          FACILITY:  [facility]   PHYSICIAN:  Newt Minion, MD     DATE OF BIRTH:  Nov 05, 1971   DATE OF PROCEDURE:  02/12/2006  DATE OF DISCHARGE:                                 OPERATIVE REPORT   PREOPERATIVE DIAGNOSIS:  1.  Left great toe hallux rigidus.  2.  Left great toenail traumatic deformity of the great toenail.   POSTOPERATIVE DIAGNOSIS:  1.  Left great toe hallux rigidus.  2.  Left great toenail traumatic deformity of the great toenail.   PROCEDURE:  1.  Left great toe first metatarsal cheilectomy.  2.  Permanent ablation of left great toenail.   SURGEON:  Newt Minion, MD   ANESTHESIA:  Ankle block.   ANTIBIOTICS:  Kefzol 1 g.   DRAINS:  None.   COMPLICATIONS:  None.   TOURNIQUET TIME:  Esmarch to the ankle for approximately 26 minutes.   DISPOSITION:  To PACU in stable condition.   INDICATIONS FOR PROCEDURE:  The patient is a 40 year old woman with hallux  rigidus of the left great toe. The patient has pain with activities of daily  living. She also has a painful deformity of the left great toe, secondary to  trauma as a child.  The patient has failed conservative care. The patient  wishes to proceed with the above-mentioned procedures. The risks and  benefits were discussed -- including infection, neurovascular injury,  persistent pain, need for additional surgery. The patient states he  understands and wished to proceed at this time.   DESCRIPTION OF PROCEDURE:  The patient was brought to OR room #3 after  undergoing an ankle block. After adequate level of anesthesia was obtained,  the patient's left lower extremity was prepped using DuraPrep and draped  into a sterile field. A longitudinal medial incision was made over the great  toe MTP joint. This was carried down through the retinaculum; the  retinaculum was retracted. The medial bone spur of the great toe and  metatarsal head was removed, and a cheilectomy was performed. The wound was  irrigated with normal saline. The patient had approximately 90 degrees of  dorsiflexion after the cheilectomy, and her great toe had a good articular  cartilage and stable range of motion. The wound was used again irrigated and  the retinacula was closed using 2-0 Vicryl. The skin was then closed using 2-  0 nylon with a modified vertical mattress suture.   Attention was then focused to the great toe. A dorsal oblique incision was  were made at the base of the nail. This was carried down.  The germinal  matrix was removed from the nail and the nail was removed. The skin  incisions were closed using 2-0 nylon. All wounds were covered with Adaptic  orthopedic sponges, sterile Webril and a Coban dressing. The patient was  then taken to PACU in stable condition.   PLAN:  Weightbearing as tolerated on the left;  ice and elevation.  To follow-  up in the office in 1 week.      Newt Minion, MD  Electronically Signed     MVD/MEDQ  D:  02/12/2006  T:  02/12/2006  Job:  [id number]

## 2011-05-16 NOTE — Op Note (Signed)
[facility]  Patient:    Shelly Wilkinson, Shelly Wilkinson                       MRN: [id number] Proc. Date: 02/13/01 Adm. Date:  [id number] Attending:  Irven Shelling                           Operative Report  PREOPERATIVE DIAGNOSIS:       Incomplete abortion.  POSTOPERATIVE DIAGNOSIS:      Incomplete abortion.  OPERATION:                    D&E.  SURGEON:                      R. Noralee Chars, M.D.  ANESTHESIA:                   MAC with local supplementation.  ESTIMATED BLOOD LOSS:         Less than 50 cc.  FINDINGS:                     Products of conception.  COMPLICATIONS:                None.  DESCRIPTION OF PROCEDURE:     The patient was taken to the operating room where a MAC anesthetic was administered.  The patient was placed on the operating table in the dorsolithotomy position, a speculum was placed, the perineum and vagina was prepped and draped in the usual sterile fashion with Betadine and sterile drapes.  The cervix was then grasped with a single tooth tenaculum and a paracervical block was placed in the 3 and 9 oclock positions with 10 cc of 1% lidocaine.  The cervix was then serially dilated until a #25 Pratt dilator fit easily into the anterior cervical os.  Next, using a #8 suction curet, the uterus was systemically emptied in the usual fashion.  Three passes were made with products of conception aspirated. The uterus was then noted to be empty.  The uterus was sharply curetted and it was indeed empty.  Two final passes were made with the suction curet.  All vaginal instruments were removed.  The patient was awakened and taken to the recovery room in good condition. The patient has a blood type of O-negative.  She did receive RhoGAM on February 09, 2001, and therefore, it will not be repeated.  There were no perioperative complications. DD:  02/13/01 TD:  02/14/01 Job: [id number]

## 2011-05-29 ENCOUNTER — Ambulatory Visit (HOSPITAL_COMMUNITY)
Admission: RE | Admit: 2011-05-29 | Discharge: 2011-05-29 | Disposition: A | Discharge status: Home / Self Care | Payer: Managed Care, Other (non HMO) | Source: Ambulatory Visit | Attending: Obstetrics & Gynecology | Admitting: Obstetrics & Gynecology

## 2011-05-29 ENCOUNTER — Other Ambulatory Visit: Payer: Self-pay | Admitting: Obstetrics & Gynecology

## 2011-05-29 DIAGNOSIS — N920 Excessive and frequent menstruation with regular cycle: Secondary | ICD-10-CM

## 2011-05-29 DIAGNOSIS — N930 Postcoital and contact bleeding: Secondary | ICD-10-CM | POA: Insufficient documentation

## 2011-05-29 DIAGNOSIS — N921 Excessive and frequent menstruation with irregular cycle: Secondary | ICD-10-CM | POA: Insufficient documentation

## 2011-05-29 HISTORY — PX: OTHER SURGICAL HISTORY: SHX169

## 2011-05-29 LAB — PREGNANCY, URINE: Preg Test, Ur: NEGATIVE

## 2011-05-29 LAB — CBC
HCT: 35.8 % — ABNORMAL LOW (ref 36.0–46.0)
Hemoglobin: 12 g/dL (ref 12.0–15.0)
MCH: 30.7 pg (ref 26.0–34.0)
MCHC: 33.5 g/dL (ref 30.0–36.0)
MCV: 91.6 fL (ref 78.0–100.0)
Platelets: 174 10*3/uL (ref 150–400)
RBC: 3.91 MIL/uL (ref 3.87–5.11)
RDW: 13.4 % (ref 11.5–15.5)
WBC: 3.9 10*3/uL — ABNORMAL LOW (ref 4.0–10.5)

## 2011-06-12 ENCOUNTER — Other Ambulatory Visit: Payer: Self-pay | Admitting: Dermatology

## 2011-06-16 NOTE — Op Note (Signed)
NAME:  Shelly Wilkinson, DEMICCO NO.:  [id number]  MEDICAL RECORD NO.:  [id number]           PATIENT TYPE:  O  LOCATION:  [facility]                          [facility]ACILITY:  [facility]  PHYSICIAN:  Guss Bunde, M.D. DATE OF BIRTH:  1971/12/10  DATE OF PROCEDURE:  05/29/2011 DATE OF DISCHARGE:                              OPERATIVE REPORT   PREOPERATIVE DIAGNOSIS:  This is a 40 year old female with metrorrhagia and postcoital bleeding.  POSTOPERATIVE DIAGNOSIS:  This is a 40 year old female with metrorrhagia and postcoital bleeding.  PROCEDURES:  Dilatation and curettage, hysteroscopy, hydrothermal ablation, and LEEP to remove friable cervical ectropian.  SURGEON:  Guss Bunde, MD  ANESTHESIA:  General.  SPECIMEN:  Endometrial curettings and cervical cone biopsy to pathology.  ESTIMATED BLOOD LOSS:  50.  COMPLICATIONS:  None.  DESCRIPTION OF PROCEDURE:  After informed consent was obtained, the patient was taken to the operating room where general anesthesia was induced.  The patient placed in dorsal lithotomy position and prepared in a normal sterile fashion.  SCDs were on the lower extremities.  The bladder was emptied with a catheter.  A bimanual was performed and the uterus was confirmed to be anteverted.  A bivalve speculum was placed into the patient's vagina and the cervical os was brought into view.  The anterior lip of the cervix was grasped with a single-toothed tenaculum.  The os was gently dilated with an 8.5 Hegar and it was noted that this cervix was difficult to dilate after approximately 5 mm.  The hysteroscope was inserted and there was noted to be copious amount of tissue.  Sharp curretage was performed.  The hysteroscope was reintroduced where the cavity could be better visualized.  The ablation was began.  The cavity's was integrity checked and  was noted to be intact and there was no extravasation of fluid. The water was gently warmed to 80 degrees  Celsius and 10-minute ablation was performed.  There was a good char on all the endometrial tissue. There was a 1-minute cool down.  At the end of the procedure, some of the charred endometrium was removed with a sharp curette.  It was noted that there was some bleeding at the internal os from the dilation that was minimal but could be the source of her problem.  The LEEP was then performed to address the friable cervical tissue.  The bivalve speculum was replaced with an insulated speculum and the cervix was grasped with the single- toothed tenaculum.  Lidocaine 1% with 1:200,000 epinephrine was injected on the cervical portia, 10 mL was used.  A Fischer cone biopsy medium extended tip was used to excise this viable cervical tissue. This was completed with good hemostasis.  The edges were roller balled and Monsel's was used to assure hemostasis.  One minute passed and there was no bleeding.  All instruments were then removed from the patient's vagina.  The patient went to recovery in a stable condition.     Guss Bunde, M.D.    Townsend Roger  D:  05/29/2011  T:  05/30/2011  Job:  [id number]  Electronically Signed by  Silas Sacramento M.D. on 06/16/2011 01:38:10 PM

## 2011-06-17 ENCOUNTER — Encounter (INDEPENDENT_AMBULATORY_CARE_PROVIDER_SITE_OTHER): Payer: Managed Care, Other (non HMO) | Admitting: Obstetrics & Gynecology

## 2011-06-17 DIAGNOSIS — N898 Other specified noninflammatory disorders of vagina: Secondary | ICD-10-CM

## 2011-06-24 ENCOUNTER — Ambulatory Visit (INDEPENDENT_AMBULATORY_CARE_PROVIDER_SITE_OTHER): Payer: Managed Care, Other (non HMO) | Admitting: Obstetrics & Gynecology

## 2011-06-24 DIAGNOSIS — N898 Other specified noninflammatory disorders of vagina: Secondary | ICD-10-CM

## 2011-07-09 NOTE — Consult Note (Signed)
NAME:  Shelly, Wilkinson NO.:  [id number]  MEDICAL RECORD NO.:  [id number]           PATIENT TYPE:  LOCATION:  [place] [facility] [place]:  PHYSICIAN:  Silas Sacramento, MD      DATE OF BIRTH:  1971-11-24  DATE OF SERVICE:  06/17/2011                                 CLINIC NOTE  The patient is a 39 year old female who presents for her postoperative visit.  She did have some vaginal bleeding last week which is a large clot, but then very minimal bleeding afterwards.  This could have been some bleeding from the cone that was done.  She is not bleeding currently, but having a yellowish discharge which is normal after ablation.  Pathology revealed proliferative endometrium with a benign endometrial polyp and blood.  No hyperplasia or malignancy.  The cervix showed focal atypical squamous metaplasia with not involving the margins of the cone.  There was also mild cervicitis. There was no evidence of dysplasia or squamous intraepithelial lesion. The patient was also HPV negative in her last Pap.  The patient has not had sexual intercourse yet.  PHYSICAL EXAMINATION:  VITAL SIGNS:  Pulse 86, blood pressure 101/59, weight 128, height 62.5 inches. GENERAL:  Well nourished, well developed, in no apparent distress. HEENT:  Normocephalic, atraumatic. ABDOMEN:  Soft, nontender. GENITALIA:  Tanner V.  Vagina pink, normal rugae.  There was a small amount of yellowish straw-colored fluid from the vagina.  The cervix still appears friable and healing from the cone.  ASSESSMENT AND PLAN:  A 40 year old female status post ablation and loop electrosurgical excision procedure for postcoital bleeding. 1. Pathology negative. 2. Hold off on intercourse for another week as the cervix heals.  The     patient is on doxycycline and minocycline for acne issues.  I do     not think there is any underlying cervicitis.  The patient will     return in a week.     ______________________________ Silas Sacramento, MD    KL/MEDQ  D:  06/17/2011  T:  06/18/2011  Job:  [id number]

## 2011-07-09 NOTE — Consult Note (Signed)
NAME:  Shelly Wilkinson, Shelly Wilkinson NO.:  [id number]  MEDICAL RECORD NO.:  [id number]           PATIENT TYPE:  O  LOCATION:  [place] [facility] [place]:  [place]  PHYSICIAN:  Silas Sacramento, MD      DATE OF BIRTH:  April 15, 1971  DATE OF SERVICE:                                 CLINIC NOTE  The patient is a 40 year old female who continues to have metrorrhagia and postcoital bleeding.  The patient has been extensively worked up. She has had a normal ultrasound with a thin lining.  She has also had testing for gonorrhea and Chlamydia, HPV, and a Pap smear; all these were normal.  Had endometrial biopsy, which showed benign proliferative endometrium.  She then underwent cryo to help with the postcoital bleeding.  This is now returned, and she is also having irregular bleeding that is not associated with coitus.  The patient is for definitive treatment with a hydrothermal ablation, D and C, and await to excise the ectropion and friable cervix.  The patient always bleeds briskly with any manipulation of the cervix.  PAST MEDICAL HISTORY:  Migraine, arthritis, hypercholesterolemia, and hypothyroidism.  PAST SURGICAL HISTORY:  None.  GYN HISTORY:  No history of abnormal Pap smears, ovarian cyst, fibroid tumors.  She does have a history of menstrual migraines.  The patient has had two vaginal deliveries and one miscarriage.  FAMILY HISTORY:  Father has diabetes and heart disease.  Father and grandmother had heart attack.  Her father has high blood pressure.  The patient has multiple cancers in her family including lung, leukemia, colon, and liver.  Her grandmother also suffered from rheumatoid arthritis and her father has had colitis.  SOCIAL HISTORY:  The patient does not smoke, drink, or do drugs.  She has occasional caffeinated beverage.  MEDICATIONS:  Per med rec.  ALLERGIES:  SULFA causing hives.  No latex allergy.  REVIEW OF SYSTEMS:  Negative.  The patient feels  well.  PHYSICAL EXAM:  Will be completed on the day of the surgery.  ASSESSMENT AND PLAN:  A 40 year old G3, para 2-0-1-2, with metrorrhagia and postcoital bleeding. 1. All testing is negative. 2. The patient is for dilation and curettage, hysteroscopy,     hydrothermal ablation, and loop electrosurgical excision procedure     to excise transformation zone and cervix ectropion.          ______________________________ Silas Sacramento, MD    KL/MEDQ  D:  05/28/2011  T:  05/29/2011  Job:  [id number]

## 2011-08-06 ENCOUNTER — Encounter: Payer: Self-pay | Admitting: *Deleted

## 2012-04-01 ENCOUNTER — Other Ambulatory Visit: Payer: Self-pay | Admitting: Dermatology

## 2012-10-19 ENCOUNTER — Ambulatory Visit (INDEPENDENT_AMBULATORY_CARE_PROVIDER_SITE_OTHER): Payer: Managed Care, Other (non HMO) | Admitting: Obstetrics & Gynecology

## 2012-10-19 ENCOUNTER — Encounter: Payer: Self-pay | Admitting: Obstetrics & Gynecology

## 2012-10-19 VITALS — BP 110/67 | HR 73 | Temp 96.9°F | Resp 16 | Wt 124.0 lb

## 2012-10-19 DIAGNOSIS — Z1231 Encounter for screening mammogram for malignant neoplasm of breast: Secondary | ICD-10-CM

## 2012-10-19 DIAGNOSIS — Z01419 Encounter for gynecological examination (general) (routine) without abnormal findings: Secondary | ICD-10-CM

## 2012-10-19 DIAGNOSIS — Z124 Encounter for screening for malignant neoplasm of cervix: Secondary | ICD-10-CM

## 2012-10-19 DIAGNOSIS — N3010 Interstitial cystitis (chronic) without hematuria: Secondary | ICD-10-CM | POA: Insufficient documentation

## 2012-10-19 DIAGNOSIS — Z1151 Encounter for screening for human papillomavirus (HPV): Secondary | ICD-10-CM

## 2012-10-19 DIAGNOSIS — G43909 Migraine, unspecified, not intractable, without status migrainosus: Secondary | ICD-10-CM

## 2012-10-19 DIAGNOSIS — Z23 Encounter for immunization: Secondary | ICD-10-CM

## 2012-10-19 MED ORDER — INFLUENZA VIRUS VACC SPLIT PF IM SUSP: 0.5000 mL | Freq: Once | INTRAMUSCULAR | Status: DC

## 2012-10-19 NOTE — Progress Notes (Signed)
  Subjective:     Shelly Wilkinson is a 41 y.o. female here for a routine exam.  Current complaints: none.  Personal health questionnaire reviewed: yes.   Gynecologic History Patient's last menstrual period was 10/03/2012. Contraception: vasectomy Last Pap: 2011. Results were: normal Last mammogram: age 20. Results were: normal  Obstetric History OB History    Grav Para Term Preterm Abortions TAB SAB Ect Mult Living   3 2 2  1  1   2      # Outc Date GA Lbr Len/2nd Wgt Sex Del Anes PTL Lv   1 TRM            2 TRM            3 SAB                The following portions of the patient's history were reviewed and updated as appropriate: allergies, current medications, past family history, past medical history, past social history, past surgical history and problem list.  Review of Systems A comprehensive review of systems was negative.    Objective:   Filed Vitals:   10/19/12 1419  BP: 110/67  Pulse: 73  Temp: 96.9 F (36.1 C)  TempSrc: Oral  Resp: 16  Weight: 124 lb (56.246 kg)    Vitals:  WNL General appearance: alert, cooperative and no distress Head: Normocephalic, without obvious abnormality, atraumatic Eyes: negative Throat: lips, mucosa, and tongue normal; teeth and gums normal Lungs: clear to auscultation bilaterally Breasts: normal appearance, no masses or tenderness, No nipple retraction or dimpling, No nipple discharge or bleeding Heart: regular rate and rhythm Abdomen: soft, non-tender; bowel sounds normal; no masses,  no organomegaly Pelvic: cervix normal in appearance, external genitalia normal, no adnexal masses or tenderness, no bladder tenderness, no cervical motion tenderness, perianal skin: no external genital warts noted, rectovaginal septum normal, urethra without abnormality or discharge, uterus normal size, shape, and consistency and vagina normal without discharge Extremities: no edema, redness or tenderness in the calves or thighs Skin: no lesions  or rash Lymph nodes: Axillary adenopathy: none       Assessment:    Healthy female exam.    Plan:    Education reviewed: self breast exams, skin cancer screening and weight bearing exercise. Contraception: vasectomy. Mammogram ordered. Follow up in: 1 year.   Pap with HPV; if negative then pap in 3 yrs Successful ablation and LEEP

## 2012-10-19 NOTE — Addendum Note (Signed)
Addended by: Asencion Islam on: 10/19/2012 04:57 PM   Modules accepted: Orders

## 2012-10-19 NOTE — Addendum Note (Signed)
Addended by: Asencion Islam on: 10/19/2012 04:54 PM   Modules accepted: Orders

## 2012-10-26 ENCOUNTER — Encounter: Payer: Self-pay | Admitting: *Deleted

## 2012-11-05 ENCOUNTER — Ambulatory Visit (HOSPITAL_COMMUNITY)
Admission: RE | Admit: 2012-11-05 | Discharge: 2012-11-05 | Disposition: A | Discharge status: Home / Self Care | Payer: Managed Care, Other (non HMO) | Source: Ambulatory Visit | Attending: Obstetrics & Gynecology | Admitting: Obstetrics & Gynecology

## 2012-11-05 DIAGNOSIS — Z01419 Encounter for gynecological examination (general) (routine) without abnormal findings: Secondary | ICD-10-CM

## 2012-11-05 DIAGNOSIS — Z1231 Encounter for screening mammogram for malignant neoplasm of breast: Secondary | ICD-10-CM | POA: Insufficient documentation

## 2013-05-31 ENCOUNTER — Encounter: Payer: Self-pay | Admitting: Internal Medicine

## 2013-05-31 ENCOUNTER — Ambulatory Visit (INDEPENDENT_AMBULATORY_CARE_PROVIDER_SITE_OTHER): Payer: BC Managed Care – PPO | Admitting: Internal Medicine

## 2013-05-31 ENCOUNTER — Other Ambulatory Visit (INDEPENDENT_AMBULATORY_CARE_PROVIDER_SITE_OTHER): Payer: BC Managed Care – PPO

## 2013-05-31 VITALS — BP 90/60 | HR 72 | Ht 62.5 in | Wt 121.4 lb

## 2013-05-31 DIAGNOSIS — K589 Irritable bowel syndrome without diarrhea: Secondary | ICD-10-CM

## 2013-05-31 DIAGNOSIS — K648 Other hemorrhoids: Secondary | ICD-10-CM

## 2013-05-31 LAB — IGA: IgA: 196 mg/dL (ref 68–378)

## 2013-05-31 MED ORDER — HYDROCORTISONE ACETATE 25 MG RE SUPP: 25.0000 mg | Freq: Every day | RECTAL | Status: DC

## 2013-05-31 MED ORDER — ALOSETRON HCL 0.5 MG PO TABS: 0.5000 mg | ORAL_TABLET | Freq: Two times a day (BID) | ORAL | Status: DC

## 2013-05-31 NOTE — Progress Notes (Signed)
  Subjective:    Patient ID: Shelly Wilkinson, female    DOB: 01-Sep-1971, 42 y.o.   MRN: [id number]  HPI Patient is here for followup for IBS. She was last seen in 2011. At that time she was doing well on a probiotic. She's had a stressful year, her son has multiple condition such as ADHD and Tourette's. She is having worsening problems with her IBS where she has multiple ridges to defecate sometimes associated with urgent defecation or failure to produce a stool. Her stools are always mushy and loose. After defecation she'll have a small amount of mucus or fluid leaking. There is no clear trigger such as consistent food problems. She has avoided milk products in the past. She probably does eat some canned E. that has some sorbitol and at times but not excessively. Her migraine headaches or doing well her interstitial cystitis flare at times but in general has been okay.  She feels like her hemorrhoids or dropping down and prolapsing and bleeding intermittently as well. Often after multiple loose stools.  After she eats she'll feel a fullness in her throat or pressure sensation there.  Medications, allergies, past medical history, past surgical history, family history and social history are reviewed and updated in the EMR.  Review of Systems As above    Objective:   Physical Exam General:  NAD Eyes:   anicteric Lungs:  clear Heart:  S1S2 no rubs, murmurs or gallops Abdomen:  soft and  Mildly tender, BS+ Ext:   no edema  Rectal - female staff present - normal anoderm. Slightly increased resting tone/stenosis but not tender, no mass on DRE  Anoscopy w/ [facility]ala staff present - inflamed internal hemorrhoids RA, RP and LL  Data Reviewed:  2011 colonoscopy was normal: Normal terminal ileum and random biopsies. It did show internal hemorrhoids.     Assessment & Plan:  IBS (irritable bowel syndrome)  Hemorrhoids, internal, with bleeding  1. I don't think she's never been screened for celiac  disease and we'll do that with a tissue transglutaminase antibody and IgA level. 2. If that is negative, would treat with Lotronex 0.5 mg twice a day. I explained the risks benefits and indications she has reviewed the possible side effects sheet and signed. Prescription given to the patient to be filled if celiac testing is negative as I expect it will be. 3. Hemorrhoid information is given to the patient. She will try to avoid straining to stool, and hopefully these will improve we can improve her bowel habits to IBS treatment. 4. She will be considered for possible hemorrhoid ligation as well. This was reviewed with the patient and she will read material. 5. She will return in 6 weeks for followup.

## 2013-05-31 NOTE — Patient Instructions (Addendum)
Your physician has requested that you go to the basement for the following lab work before leaving today: TTG, IGA  Wait to start the Lotronex until we call you with lab results.  You have been given a written rx for this today and a copy of the contract you signed will be scanned into your chart.  Research the hemorrhoid treatment that we do here in the office at the website listed on the handout we gave you.  We have sent the following medications to your pharmacy for you to pick up at your convenience: [place] and follow the hemorrhoid handout given today.  Thank you for choosing me and [facility].  Gatha Mayer, M.D., [facility]

## 2013-06-01 LAB — TISSUE TRANSGLUTAMINASE, IGA: Tissue Transglutaminase Ab, IgA: 2.9 U/mL (ref <20)

## 2013-06-02 ENCOUNTER — Encounter: Payer: Self-pay | Admitting: Internal Medicine

## 2013-06-02 ENCOUNTER — Other Ambulatory Visit: Payer: Self-pay

## 2013-06-02 NOTE — Progress Notes (Signed)
Quick Note:  Tests are negative for celiac dz Please start Lotronex - she has Rx ______

## 2013-06-07 ENCOUNTER — Telehealth: Payer: Self-pay | Admitting: Internal Medicine

## 2013-06-07 NOTE — Telephone Encounter (Signed)
Informed patient to take Lotronex 0.95m one tablet twice a day per Dr. GCelesta Averlast office visit note.

## 2013-06-07 NOTE — Telephone Encounter (Signed)
Left message to call me back.

## 2013-06-08 ENCOUNTER — Telehealth: Payer: Self-pay | Admitting: Internal Medicine

## 2013-06-08 NOTE — Telephone Encounter (Signed)
Is this a concern Sheri?

## 2013-06-08 NOTE — Telephone Encounter (Signed)
All further questions about taking lotronex answered.  She is not had constipation for many months and wanted to make sure this was ok to take with her history.  She is assured Dr. Carlean Purl is aware of the constipation history per his office note, but her current symptoms of loose stool and urgent defecation is what he is treating.  She is aware to call us if she develops constipation while taking lotronex

## 2013-06-10 ENCOUNTER — Telehealth: Payer: Self-pay | Admitting: Internal Medicine

## 2013-06-10 NOTE — Telephone Encounter (Signed)
Hold the Lotronex over the weekend and call back with an update Monday - she may end up needing just 0.5 mg daily. As long as no severe pain or bleeding she should not worry. If so - call back sooner

## 2013-06-10 NOTE — Telephone Encounter (Signed)
Pt states she started taking her lotronex Monday night. States that yesterday she noticed she was getting a little constipated so she stopped the medication. Pt states her last BM was yesterday,states she had a very small amount about the size of a quarter and this happened twice. Dr. Carlean Purl please advise.

## 2013-06-10 NOTE — Telephone Encounter (Signed)
Pt aware. She is leaving to go out of town Monday so we may need to leave her a voicemail on her cell phone Monday when we return her call.

## 2013-07-05 ENCOUNTER — Telehealth: Payer: Self-pay | Admitting: Internal Medicine

## 2013-07-05 ENCOUNTER — Ambulatory Visit (INDEPENDENT_AMBULATORY_CARE_PROVIDER_SITE_OTHER): Payer: BC Managed Care – PPO | Admitting: Internal Medicine

## 2013-07-05 ENCOUNTER — Encounter: Payer: Self-pay | Admitting: Internal Medicine

## 2013-07-05 VITALS — BP 80/42 | HR 72 | Ht 62.5 in | Wt 123.0 lb

## 2013-07-05 DIAGNOSIS — K648 Other hemorrhoids: Secondary | ICD-10-CM

## 2013-07-05 DIAGNOSIS — K589 Irritable bowel syndrome without diarrhea: Secondary | ICD-10-CM

## 2013-07-05 MED ORDER — AMBULATORY NON FORMULARY MEDICATION: Status: DC

## 2013-07-05 NOTE — Patient Instructions (Addendum)
HEMORRHOID BANDING PROCEDURE    FOLLOW-UP CARE   1. The procedure you have had should have been relatively painless since the banding of the area involved does not have nerve endings and there is no pain sensation.  The rubber band cuts off the blood supply to the hemorrhoid and the band may fall off as soon as 48 hours after the banding (the band may occasionally be seen in the toilet bowl following a bowel movement). You may notice a temporary feeling of fullness in the rectum which should respond adequately to plain Tylenol or Motrin.  2. Following the banding, avoid strenuous exercise that evening and resume full activity the next day.  A sitz bath (soaking in a warm tub) or bidet is soothing, and can be useful for cleansing the area after bowel movements.     3. To avoid constipation, take two tablespoons of natural wheat bran, natural oat bran, flax, Benefiber or any over the counter fiber supplement and increase your water intake to 7-8 glasses daily.    4. Unless you have been prescribed anorectal medication, do not put anything inside your rectum for two weeks: No suppositories, enemas, fingers, etc.  5. Occasionally, you may have more bleeding than usual after the banding procedure.  This is often from the untreated hemorrhoids rather than the treated one.  Don't be concerned if there is a tablespoon or so of blood.  If there is more blood than this, lie flat with your bottom higher than your head and apply an ice pack to the area. If the bleeding does not stop within a half an hour or if you feel faint, call our office at [phone] or go to the emergency room.  6. Problems are not common; however, if there is a substantial amount of bleeding, severe pain, chills, fever or difficulty passing urine (very rare) or other problems, you should call us at [phone] or report to the nearest emergency room.  7. Do not stay seated continuously for more than 2-3 hours for a day or two  after the procedure.  Tighten your buttock muscles 10-15 times every two hours and take 10-15 deep breaths every 1-2 hours.  Do not spend more than a few minutes on the toilet if you cannot empty your bowel; instead re-visit the toilet at a later time.    Patient Drug Education for Nitroglycerin Ointment   A pea-sized drop should be placed on the tip of your finger and then gently placed inside the anus. The finger should be inserted 1/3 - 1/2 its length and may be covered with a plastic glove or finger cot. You may use Vaseline to help coat the finger or dilute the ointment.  The first few applications should be taken lying down, as mild light-headedness or a brief headache may occur.  The most common side effect - a headache. It is usually brief and mild, but may require Tylenol or Advil. You may dilute the NTG further with Vaseline to decrease the headaches. As the treatment progresses and the hemorrhoid begins to heal, the headaches will tend to dissipate. Other side effects include lightheadedness, flushing, dizziness, nervousness, nausea, and vomiting. If any of these side effects persist or worsen, notify us promptly. Stop using the NTG and notify us immediately if you develop the rare side effects of severe dizziness, fainting, fast/pounding heartbeat, paleness, sweating, blurred vision, dry mouth, dark urine, bluish lips/skin/nails, unusual tiredness, severe weakness, irregular heartbeat, seizures, or chest pain. Serious  allergic reactions are unusual, but seek immediate medical attention if you develop a rash, swelling, dizziness, or trouble breathing.  Tell us if you are allergic to nitrates, have severe anemia, low blood pressure, dehydration, chronic heart failure, cardiomyopathy, recent heart attack, increased pressure in the brain, or exposure to nitrates while on the job. Do not use NTG while driving or working around machinery if you are drowsy, dizzy, have lightheadedness, or blurred  vision. Limit alcoholic beverages. To minimize dizziness and lightheadedness, get up slowly when rising from a sitting or lying position. The elderly may be more prone to dizziness and falling. While there are not adequate studies to confirm the safety of NTG in pregnant or breast feeding women, it has been used without incident so far. We recommend waiting at least one hour after applying the NTG ointment before breast feeding.  Do not use NTG ointment if you are taking drugs for sexual problems [e.g., sildenafil (Viagra), tadalafil (Cialis), vardenafil (Levitra)]. Use caution before taking cough-and-cold products, diet aids, or NSAIDs preparations because they may contain ingredients that could increase your blood pressure, cause a fast heartbeat, or increase chest pain (e.g., pseudoephedrine, phenylephrine, chlorpheniramine, diphenhydramine, clemastine, ibuprofen, and naproxen). Tell us if you drink alcohol, take alteplase, migraine drugs (ergotamine), water pills/diuretics such as furosemide or hydrochlorothiazide, or other drugs for high blood pressure (beta blockers, calcium channel blockers, ACE inhibitors).  Store the NTG at room temperature and keep away from light and moisture. Close the container tightly after each use. Do not store in the bathroom. Keep away from children and pets. If you have any questions or problems please call us at  [phone].    We want you to follow up with [place] in the next 2-4 weeks for a second banding.   I appreciate the opportunity to care for you.

## 2013-07-05 NOTE — Progress Notes (Addendum)
  Subjective:    Patient ID: Shelly Wilkinson, female    DOB: 11/12/1971, 42 y.o.   MRN: [id number]  HPI The patient returns after Lotronex was prescribed for IBS with diarrhea. She does not move her bowels for 2-3 days after but does not have pain. She will feel better for a week after taking one 0.5 mg tablet. She has used it 5 times since last visit and new Rx. She reports less sxs when she went away w/ husband but without the kids. [name] is easier now that they are not in school. She has significant stressors with son that has OCD, ADHD and tourette's.  Her hemorrhoids bleed with every bowel movement and there is some painful defecation. She is ready for ligation therapy. She is going to [name] next week while son is in a day camp at [name] and [name].  Medications, allergies, past medical history, past surgical history, family history and social history are reviewed and updated in the EMR.  Review of Systems As above    Objective:   Physical Exam NAD Rectal exam with female staff present reveals normal anoderm, not tender, no mass.    Assessment & Plan:  IBS (irritable bowel syndrome)  Improved - will continue prn Lotronex - she is advised not to take it bid and to use as she is - she is aware of watching for severe abdominal pain, bleeding, etc.  Internal hemorrhoids with other complication  These remain symptomatic and all 3 piles were inflamed at last visit and anoscopy.  PROCEDURE NOTE: The patient presents with symptomatic grade (1) hemorrhoids.  All risks, benefits and alternative forms of therapy were described and informed consent was obtained. She has used some hydrocortisone suppositories.   The decision was made to band the RA internal hemorrhoid, and the Shelly was used to perform band ligation without complication.  Digital anorectal examination was then performed to assure proper positioning of the band, and to adjust the banded tissue as required.   Adjustment was required as she develope some pain in the observation period after ligation. 0.125% NTG ointment was applied x 1 also.She felt better after loosening of the band. The patient was discharged home without significant pain or other issues.  The patient will return in about 2 weeks for follow-up and possible additional banding as required. No complications were encountered and the patient tolerated the procedure well.  NTG 0.125% ointment tid prescription provided as she has some pain with defecation described.   RJ:JOACZYS,AYTKZS COLLINS, MD   The patient called back with persistent rectal discomfort. The band was in same position, minimal tissue trapped. I applied NTG/lidocaine also. She decided to have the band removed so I manipulated further and removed the band. I will remove the charge of ligation since this did not work out for her. We will call her tomorrow to reassess. I suspect she is overly sensitive to pressure, pain in visceral areas so ligation of hemorrhoids is probably not right approach for her based upon this response.  Shelly Mayer, MD, [facility] [phone] (pager) 07/05/2013 4:45 PM

## 2013-07-05 NOTE — Assessment & Plan Note (Signed)
Ligation #1 of right anterior pile today NTG 0.125% ointment tid also Return in about 2 weeks to consider repeat ligation (LL or RP)

## 2013-07-05 NOTE — Telephone Encounter (Signed)
Patient having rectal pain after hemorrhoidal banding procedure this am.  She will come back to office for an adjustment of the band.

## 2013-07-05 NOTE — Assessment & Plan Note (Signed)
Prn 0.5 mg Lotronex seems to be successful Will continue this

## 2013-07-06 ENCOUNTER — Telehealth: Payer: Self-pay

## 2013-07-06 NOTE — Telephone Encounter (Signed)
Message copied by [place], Anastasios Melander E on Wed Jul 06, 2013  4:52 PM ------      Message from: Silvano Rusk E      Created: Wed Jul 06, 2013 10:52 AM       Up to her - I would like her to use the NTG to see if it helps rectal pain she was having with defecation and see me either in Aug or september      ----- Message -----         From: Shloimy Michalski E [place], [place]: 07/06/2013  10:32 AM           To: Gatha Mayer, MD            Spoke to patient, she reports being sore but she is much better.  She wants to know if you still want to see her 08/10/13?       ------

## 2013-07-06 NOTE — Telephone Encounter (Signed)
The patient has been notified of this information and all questions answered. She will keep her 08/10/13 appointment.

## 2013-08-10 ENCOUNTER — Ambulatory Visit: Payer: BC Managed Care – PPO | Admitting: Internal Medicine

## 2013-09-08 ENCOUNTER — Ambulatory Visit: Payer: BC Managed Care – PPO | Admitting: Internal Medicine

## 2013-10-11 ENCOUNTER — Encounter: Payer: Self-pay | Admitting: Internal Medicine

## 2013-10-11 ENCOUNTER — Ambulatory Visit (INDEPENDENT_AMBULATORY_CARE_PROVIDER_SITE_OTHER): Payer: BC Managed Care – PPO | Admitting: Internal Medicine

## 2013-10-11 VITALS — BP 98/58 | HR 80 | Ht 62.5 in | Wt 122.0 lb

## 2013-10-11 DIAGNOSIS — K648 Other hemorrhoids: Secondary | ICD-10-CM

## 2013-10-11 DIAGNOSIS — K589 Irritable bowel syndrome without diarrhea: Secondary | ICD-10-CM

## 2013-10-11 MED ORDER — HYDROCORTISONE ACETATE 25 MG RE SUPP: 25.0000 mg | Freq: Every day | RECTAL | Status: DC

## 2013-10-11 MED ORDER — AMBULATORY NON FORMULARY MEDICATION: Status: DC

## 2013-10-11 NOTE — Progress Notes (Signed)
Subjective:    Patient ID: Shelly Wilkinson, female    DOB: 1971/01/05, 42 y.o.   MRN: [id number]  HPI The patient is here for followup of her irritable bowel syndrome. The Lotronex helps quite a bit but if she takes for days in a row she will go constipated and back problems. She has been taking it every other day or so and that works but she still has problems were she has to hold that and then she has diarrhea. So her hemorrhoids to continue to bleed with the swings from constipation to diarrhea. Overall she feels like things are definitely improved however. Allergies  Allergen Reactions  . Sulfonamide Derivatives     REACTION: hives/fever   Outpatient Prescriptions Prior to Visit  Medication Sig Dispense Refill  . ACZONE 5 % topical gel       . alosetron (LOTRONEX) 0.5 MG tablet Take 0.5 mg by mouth 2 (two) times daily. PRN      . cholecalciferol (VITAMIN D) 1000 UNITS tablet Take 1,000 Units by mouth every Wednesday. Takes it sometimes 2 times a week      . dihydroergotamine (MIGRANAL) 4 MG/ML nasal spray as needed.       . gabapentin (NEURONTIN) 300 MG capsule Take 900 mg by mouth 2 (two) times daily.       . OnabotulinumtoxinA (BOTOX IJ) Inject as directed. botox injections every 3 months for migraines      . pentosan polysulfate (ELMIRON) 100 MG capsule Take 100 mg by mouth 2 (two) times daily.      Marland Kitchen spironolactone (ALDACTONE) 100 MG tablet       . SYNTHROID 75 MCG tablet       . AMBULATORY NON FORMULARY MEDICATION Nitroglycerine ointment 0.125 %  Apply a pea sized amount internally two to three times a day Dispense 30 GM zero refill  30 g  0  . hydrocortisone (ANUSOL-HC) 25 MG suppository Place 1 suppository (25 mg total) rectally at bedtime. For 3 nights then as needed  12 suppository  0  . Nabumetone (RELAFEN PO) Take 10 mg by mouth as needed.       No facility-administered medications prior to visit.   Past Medical History  Diagnosis Date  . History of hypercholesterolemia    . Hypothyroidism   . Arthritis of big toe   . Migraines   . Post - coital bleeding   . IC (interstitial cystitis)   . IBS (irritable bowel syndrome)    Past Surgical History  Procedure Laterality Date  . Cryotherapy      of cervix  . Leep      05/29/11  . Endometrial ablation    . Dilation and curettage of uterus    . Cystoscopy    . Colonoscopy  10/02/2010   Review of Systems As per history of present illness    Objective:   Physical Exam Well-developed well-nourished middle-aged woman in no acute distress    Assessment & Plan:   1. Internal hemorrhoids with other complication   2. Irritable bowel syndrome    Current outpatient prescriptions:ACZONE 5 % topical gel, , Disp: , Rfl: ;  alosetron (LOTRONEX) 0.5 MG tablet, Take 0.5 mg by mouth 2 (two) times daily. PRN, Disp: , Rfl: ;  AMBULATORY NON FORMULARY MEDICATION, Nitroglycerine ointment 0.125 %  Apply a pea sized amount internally two to three times a day Dispense 30 GM zero refill, Disp: 30 g, Rfl: 3 cholecalciferol (VITAMIN D) 1000 UNITS tablet, Take  1,000 Units by mouth every Wednesday. Takes it sometimes 2 times a week, Disp: , Rfl: ;  dihydroergotamine (MIGRANAL) 4 MG/ML nasal spray, as needed. , Disp: , Rfl: ;  gabapentin (NEURONTIN) 300 MG capsule, Take 900 mg by mouth 2 (two) times daily. , Disp: , Rfl: ;  metoCLOPramide (REGLAN) 10 MG tablet, , Disp: , Rfl:  OnabotulinumtoxinA (BOTOX IJ), Inject as directed. botox injections every 3 months for migraines, Disp: , Rfl: ;  pentosan polysulfate (ELMIRON) 100 MG capsule, Take 100 mg by mouth 2 (two) times daily., Disp: , Rfl: ;  spironolactone (ALDACTONE) 100 MG tablet, , Disp: , Rfl: ;  SYNTHROID 75 MCG tablet, , Disp: , Rfl: ;  tazarotene (TAZORAC) 0.05 % cream, Apply topically at bedtime., Disp: , Rfl:  hydrocortisone (ANUSOL-HC) 25 MG suppository, Place 1 suppository (25 mg total) rectally at bedtime. For 7 nights then as needed, Disp: 24 suppository, Rfl: 0

## 2013-10-11 NOTE — Assessment & Plan Note (Addendum)
Fine tune Lotoronex as it is working. She will try 0.25 mg daily to see if that works. She may need to take that every other day or so. She is to call me back or masses me on my chart and will try fine-tune [place] to promote more regular defecation without swings from constipation to diarrhea. She will see me routinely near sooner if needed.

## 2013-10-11 NOTE — Patient Instructions (Addendum)
We have sent the following medications to your pharmacy for you to pick up at your convenience: Hydrocortisone suppositories  to [facility]  We are faxing your nitroglycerine ointment to [facility].  Adjust your Lotronex as discussed.  Follow up with [place] in a year or sooner if needed.  I appreciate the opportunity to care for you.

## 2013-10-11 NOTE — Assessment & Plan Note (Addendum)
HC suppositories to be used nightly for 7 eyes and then as needed. I am hopeful with her regulation of her defecation these will be less of a problem. She attempted banding previously but could not tolerate the sensation of the band in the rectum.

## 2013-10-31 ENCOUNTER — Other Ambulatory Visit: Payer: Self-pay | Admitting: Obstetrics & Gynecology

## 2013-10-31 DIAGNOSIS — Z1231 Encounter for screening mammogram for malignant neoplasm of breast: Secondary | ICD-10-CM

## 2013-11-11 ENCOUNTER — Ambulatory Visit (HOSPITAL_COMMUNITY)
Admission: RE | Admit: 2013-11-11 | Discharge: 2013-11-11 | Disposition: A | Discharge status: Home / Self Care | Payer: BC Managed Care – PPO | Source: Ambulatory Visit | Attending: Obstetrics & Gynecology | Admitting: Obstetrics & Gynecology

## 2013-11-11 DIAGNOSIS — Z1231 Encounter for screening mammogram for malignant neoplasm of breast: Secondary | ICD-10-CM | POA: Insufficient documentation

## 2013-12-12 ENCOUNTER — Encounter (HOSPITAL_BASED_OUTPATIENT_CLINIC_OR_DEPARTMENT_OTHER): Payer: Self-pay | Admitting: *Deleted

## 2013-12-14 ENCOUNTER — Other Ambulatory Visit: Payer: Self-pay | Admitting: Orthopedic Surgery

## 2013-12-14 NOTE — H&P (Signed)
Shelly Wilkinson is an 42 y.o. female.   Chief Complaint: Right great toe pain HPI: Pt reports to [facility] for treatment of her hallux rigidus pain with right hallux cheilectomy procedure. Pt denies N/V/F/C, chest pain, SOB, calf pain, and paresthesia bilaterally.  Past Medical History  Diagnosis Date  . History of hypercholesterolemia   . Hypothyroidism   . Arthritis of big toe   . Migraines   . Post - coital bleeding   . IC (interstitial cystitis)   . IBS (irritable bowel syndrome)   . Wears glasses   . Complication of anesthesia     headaches    Past Surgical History  Procedure Laterality Date  . Cryotherapy      of cervix  . Leep      05/29/11  . Endometrial ablation    . Dilation and curettage of uterus    . Cystoscopy    . Colonoscopy  10/02/2010    Family History  Problem Relation Age of Onset  . Diabetes Father   . Heart disease Father   . Heart attack Father   . Heart attack Paternal Grandmother   . Hypertension Father   . Colitis Father   . Rheum arthritis Maternal Grandmother   . Colon cancer Maternal Aunt    Social History:  reports that she has never smoked. She has never used smokeless tobacco. She reports that she drinks alcohol. She reports that she does not use illicit drugs.  Allergies:  Allergies  Allergen Reactions  . Sulfonamide Derivatives     REACTION: hives/fever    No prescriptions prior to admission    No results found for this or any previous visit (from the past 48 hour(s)). No results found.  Review of Systems  Constitutional: Negative.   HENT: Negative.   Eyes: Negative.   Respiratory: Negative.   Cardiovascular: Negative.   Gastrointestinal: Negative.   Musculoskeletal: Negative.   Skin: Negative.   Endo/Heme/Allergies: Negative.   Psychiatric/Behavioral: The patient is not nervous/anxious.     Height 5' 2.5" (1.588 m), weight 52.617 kg (116 lb). Physical Exam  WD WN 42 y/o female NAD, A/Ox3, appears  stated age.  EOMI, respirations unlabored, mood and affect normal.  On physical exam right MP Hallux joint has mild bunion deformity with decreased ROM. DP pulses 2+ bilaterally, skin healthy and intact, normal sensation to light touch intact, toes well perfused with cap refill <2 sec.  No lymphadenopathy noted on exam.  Assessment/Plan I had detailed discussion with pt regarding right hallux cheilectomy procedure and f/u with Dr. Doran Durand in 2 weeks.  The risks and benefits of the alternative treatment options have been discussed in detail.  The patient wishes to proceed with surgery and specifically understands risks of bleeding, infection, nerve damage, blood clots, need for additional surgery, amputation and death.    Wilkinson, CHRISTOPHER S 12/14/2013, 6:17 PM   Agree with note above.  Pt presents now for right hallux cheilectomy.  The risks and benefits of the alternative treatment options have been discussed in detail.  The patient wishes to proceed with surgery and specifically understands risks of bleeding, infection, nerve damage, blood clots, need for additional surgery, amputation and death.

## 2013-12-15 ENCOUNTER — Encounter (HOSPITAL_BASED_OUTPATIENT_CLINIC_OR_DEPARTMENT_OTHER): Payer: BC Managed Care – PPO | Admitting: Certified Registered"

## 2013-12-15 ENCOUNTER — Encounter (HOSPITAL_BASED_OUTPATIENT_CLINIC_OR_DEPARTMENT_OTHER)
Admission: RE | Disposition: A | Discharge status: Home / Self Care | Payer: Self-pay | Source: Ambulatory Visit | Attending: Orthopedic Surgery

## 2013-12-15 ENCOUNTER — Ambulatory Visit (HOSPITAL_BASED_OUTPATIENT_CLINIC_OR_DEPARTMENT_OTHER)
Admission: RE | Admit: 2013-12-15 | Discharge: 2013-12-15 | Disposition: A | Discharge status: Home / Self Care | Payer: BC Managed Care – PPO | Source: Ambulatory Visit | Attending: Orthopedic Surgery | Admitting: Orthopedic Surgery

## 2013-12-15 ENCOUNTER — Ambulatory Visit (HOSPITAL_BASED_OUTPATIENT_CLINIC_OR_DEPARTMENT_OTHER): Payer: BC Managed Care – PPO | Admitting: Certified Registered"

## 2013-12-15 ENCOUNTER — Encounter (HOSPITAL_BASED_OUTPATIENT_CLINIC_OR_DEPARTMENT_OTHER): Payer: Self-pay

## 2013-12-15 DIAGNOSIS — M2021 Hallux rigidus, right foot: Secondary | ICD-10-CM

## 2013-12-15 DIAGNOSIS — R51 Headache: Secondary | ICD-10-CM | POA: Insufficient documentation

## 2013-12-15 DIAGNOSIS — M2020 Hallux rigidus, unspecified foot: Secondary | ICD-10-CM | POA: Insufficient documentation

## 2013-12-15 DIAGNOSIS — E039 Hypothyroidism, unspecified: Secondary | ICD-10-CM | POA: Insufficient documentation

## 2013-12-15 HISTORY — PX: CHEILECTOMY: SHX1336

## 2013-12-15 HISTORY — DX: Adverse effect of unspecified anesthetic, initial encounter: T41.45XA

## 2013-12-15 HISTORY — DX: Other complications of anesthesia, initial encounter: T88.59XA

## 2013-12-15 HISTORY — DX: Presence of spectacles and contact lenses: Z97.3

## 2013-12-15 SURGERY — CHEILECTOMY
Anesthesia: General | Site: Foot | Laterality: Right

## 2013-12-15 MED ORDER — CEFAZOLIN SODIUM-DEXTROSE 2-3 GM-% IV SOLR
INTRAVENOUS | Status: AC
  Filled 2013-12-15: qty 50

## 2013-12-15 MED ORDER — HYDROMORPHONE HCL PF 1 MG/ML IJ SOLN: 0.2500 mg | INTRAMUSCULAR | Status: DC | PRN

## 2013-12-15 MED ORDER — BACITRACIN ZINC 500 UNIT/GM EX OINT
TOPICAL_OINTMENT | CUTANEOUS | Status: DC | PRN
  Administered 2013-12-15: 1 via TOPICAL

## 2013-12-15 MED ORDER — CHLORHEXIDINE GLUCONATE 4 % EX LIQD: 60.0000 mL | Freq: Once | CUTANEOUS | Status: DC

## 2013-12-15 MED ORDER — DEXAMETHASONE SODIUM PHOSPHATE 10 MG/ML IJ SOLN
INTRAMUSCULAR | Status: DC | PRN
  Administered 2013-12-15: 10 mg via INTRAVENOUS

## 2013-12-15 MED ORDER — OXYCODONE HCL 5 MG/5ML PO SOLN: 5.0000 mg | Freq: Once | ORAL | Status: DC | PRN

## 2013-12-15 MED ORDER — MIDAZOLAM HCL 2 MG/2ML IJ SOLN
INTRAMUSCULAR | Status: AC
  Filled 2013-12-15: qty 2

## 2013-12-15 MED ORDER — SODIUM CHLORIDE 0.9 % IV SOLN: INTRAVENOUS | Status: DC

## 2013-12-15 MED ORDER — ONDANSETRON HCL 4 MG/2ML IJ SOLN
INTRAMUSCULAR | Status: DC | PRN
  Administered 2013-12-15: 4 mg via INTRAVENOUS

## 2013-12-15 MED ORDER — OXYCODONE HCL 5 MG PO TABS
5.0000 mg | ORAL_TABLET | ORAL | Status: DC | PRN
Start: 2013-12-15 — End: 2014-01-10

## 2013-12-15 MED ORDER — ACETAMINOPHEN 500 MG PO TABS
ORAL_TABLET | ORAL | Status: AC
  Filled 2013-12-15: qty 2

## 2013-12-15 MED ORDER — MIDAZOLAM HCL 2 MG/2ML IJ SOLN
1.0000 mg | INTRAMUSCULAR | Status: DC | PRN
  Administered 2013-12-15: 2 mg via INTRAVENOUS

## 2013-12-15 MED ORDER — MIDAZOLAM HCL 5 MG/5ML IJ SOLN
INTRAMUSCULAR | Status: DC | PRN
  Administered 2013-12-15: 1 mg via INTRAVENOUS

## 2013-12-15 MED ORDER — OXYCODONE HCL 5 MG PO TABS: 5.0000 mg | ORAL_TABLET | Freq: Once | ORAL | Status: DC | PRN

## 2013-12-15 MED ORDER — FENTANYL CITRATE 0.05 MG/ML IJ SOLN
INTRAMUSCULAR | Status: AC
  Filled 2013-12-15: qty 2

## 2013-12-15 MED ORDER — ACETAMINOPHEN 500 MG PO TABS
1000.0000 mg | ORAL_TABLET | Freq: Once | ORAL | Status: AC
  Administered 2013-12-15: 1000 mg via ORAL

## 2013-12-15 MED ORDER — 0.9 % SODIUM CHLORIDE (POUR BTL) OPTIME
TOPICAL | Status: DC | PRN
  Administered 2013-12-15: 300 mL

## 2013-12-15 MED ORDER — CEFAZOLIN SODIUM-DEXTROSE 2-3 GM-% IV SOLR
2.0000 g | INTRAVENOUS | Status: AC
  Administered 2013-12-15: 2 g via INTRAVENOUS

## 2013-12-15 MED ORDER — ONDANSETRON HCL 4 MG/2ML IJ SOLN: 4.0000 mg | Freq: Once | INTRAMUSCULAR | Status: DC | PRN

## 2013-12-15 MED ORDER — PROMETHAZINE HCL 12.5 MG PO TABS: 12.5000 mg | ORAL_TABLET | Freq: Four times a day (QID) | ORAL | Status: DC | PRN

## 2013-12-15 MED ORDER — LIDOCAINE HCL (CARDIAC) 20 MG/ML IV SOLN
INTRAVENOUS | Status: DC | PRN
  Administered 2013-12-15: 30 mg via INTRAVENOUS

## 2013-12-15 MED ORDER — LACTATED RINGERS IV SOLN
INTRAVENOUS | Status: DC
  Administered 2013-12-15 (×2): via INTRAVENOUS

## 2013-12-15 MED ORDER — FENTANYL CITRATE 0.05 MG/ML IJ SOLN
50.0000 ug | INTRAMUSCULAR | Status: DC | PRN
  Administered 2013-12-15: 100 ug via INTRAVENOUS

## 2013-12-15 MED ORDER — ROPIVACAINE HCL 5 MG/ML IJ SOLN
INTRAMUSCULAR | Status: DC | PRN
  Administered 2013-12-15: 20 mL via PERINEURAL

## 2013-12-15 MED ORDER — PROPOFOL 10 MG/ML IV BOLUS
INTRAVENOUS | Status: DC | PRN
  Administered 2013-12-15: 150 mg via INTRAVENOUS

## 2013-12-15 MED ORDER — BUPIVACAINE-EPINEPHRINE PF 0.5-1:200000 % IJ SOLN
INTRAMUSCULAR | Status: DC | PRN
  Administered 2013-12-15: 20 mL via PERINEURAL

## 2013-12-15 MED ORDER — FENTANYL CITRATE 0.05 MG/ML IJ SOLN
INTRAMUSCULAR | Status: AC
  Filled 2013-12-15: qty 6

## 2013-12-15 MED ORDER — PROPOFOL 10 MG/ML IV EMUL
INTRAVENOUS | Status: AC
  Filled 2013-12-15: qty 50

## 2013-12-15 SURGICAL SUPPLY — 69 items
BANDAGE ESMARK 6X9 LF (GAUZE/BANDAGES/DRESSINGS) IMPLANT
BLADE AVERAGE 25X9 (BLADE) ×2 IMPLANT
BLADE MINI RND TIP GREEN BEAV (BLADE) IMPLANT
BLADE OSC/SAG .038X5.5 CUT EDG (BLADE) IMPLANT
BLADE SURG 15 STRL LF DISP TIS (BLADE) ×2 IMPLANT
BLADE SURG 15 STRL SS (BLADE) ×2
BNDG CMPR 9X4 STRL LF SNTH (GAUZE/BANDAGES/DRESSINGS)
BNDG CMPR 9X6 STRL LF SNTH (GAUZE/BANDAGES/DRESSINGS) ×1
BNDG COHESIVE 4X5 TAN STRL (GAUZE/BANDAGES/DRESSINGS) ×2 IMPLANT
BNDG CONFORM 2 STRL LF (GAUZE/BANDAGES/DRESSINGS) IMPLANT
BNDG CONFORM 3 STRL LF (GAUZE/BANDAGES/DRESSINGS) ×2 IMPLANT
BNDG ESMARK 4X9 LF (GAUZE/BANDAGES/DRESSINGS) IMPLANT
BNDG ESMARK 6X9 LF (GAUZE/BANDAGES/DRESSINGS) ×2
CHLORAPREP W/TINT 26ML (MISCELLANEOUS) ×2 IMPLANT
COVER TABLE BACK 60X90 (DRAPES) ×2 IMPLANT
CUFF TOURNIQUET SINGLE 24IN (TOURNIQUET CUFF) ×1 IMPLANT
CUFF TOURNIQUET SINGLE 34IN LL (TOURNIQUET CUFF) IMPLANT
DRAPE EXTREMITY T 121X128X90 (DRAPE) ×2 IMPLANT
DRAPE OEC MINIVIEW 54X84 (DRAPES) IMPLANT
DRAPE U-SHAPE 47X51 STRL (DRAPES) ×2 IMPLANT
DRSG EMULSION OIL 3X3 NADH (GAUZE/BANDAGES/DRESSINGS) ×2 IMPLANT
ELECT REM PT RETURN 9FT ADLT (ELECTROSURGICAL) ×2
ELECTRODE REM PT RTRN 9FT ADLT (ELECTROSURGICAL) ×1 IMPLANT
GLOVE BIO SURGEON STRL SZ8 (GLOVE) ×2 IMPLANT
GLOVE BIOGEL PI IND STRL 7.0 (GLOVE) IMPLANT
GLOVE BIOGEL PI IND STRL 7.5 (GLOVE) ×1 IMPLANT
GLOVE BIOGEL PI IND STRL 8 (GLOVE) ×1 IMPLANT
GLOVE BIOGEL PI INDICATOR 7.0 (GLOVE) ×1
GLOVE BIOGEL PI INDICATOR 7.5 (GLOVE) ×1
GLOVE BIOGEL PI INDICATOR 8 (GLOVE) ×1
GLOVE ECLIPSE 6.5 STRL STRAW (GLOVE) ×1 IMPLANT
GLOVE ECLIPSE 7.0 STRL STRAW (GLOVE) ×2 IMPLANT
GLOVE EXAM NITRILE MD LF STRL (GLOVE) ×1 IMPLANT
GOWN PREVENTION PLUS XLARGE (GOWN DISPOSABLE) ×4 IMPLANT
GOWN PREVENTION PLUS XXLARGE (GOWN DISPOSABLE) ×2 IMPLANT
K-WIRE .062X4 (WIRE) ×1 IMPLANT
NDL HYPO 25X1 1.5 SAFETY (NEEDLE) IMPLANT
NEEDLE HYPO 25X1 1.5 SAFETY (NEEDLE) IMPLANT
NS IRRIG 1000ML POUR BTL (IV SOLUTION) ×2 IMPLANT
PACK BASIN DAY SURGERY FS (CUSTOM PROCEDURE TRAY) ×2 IMPLANT
PAD ABD 8X10 STRL (GAUZE/BANDAGES/DRESSINGS) ×2 IMPLANT
PAD CAST 4YDX4 CTTN HI CHSV (CAST SUPPLIES) ×1 IMPLANT
PADDING CAST ABS 4INX4YD NS (CAST SUPPLIES)
PADDING CAST ABS COTTON 4X4 ST (CAST SUPPLIES) ×1 IMPLANT
PADDING CAST COTTON 4X4 STRL (CAST SUPPLIES) ×2
PENCIL BUTTON HOLSTER BLD 10FT (ELECTRODE) ×2 IMPLANT
SANITIZER HAND PURELL 535ML FO (MISCELLANEOUS) ×2 IMPLANT
SHEET MEDIUM DRAPE 40X70 STRL (DRAPES) ×2 IMPLANT
SLEEVE SCD COMPRESS KNEE MED (MISCELLANEOUS) ×2 IMPLANT
SPONGE GAUZE 4X4 12PLY (GAUZE/BANDAGES/DRESSINGS) ×4 IMPLANT
SPONGE LAP 18X18 X RAY DECT (DISPOSABLE) ×2 IMPLANT
STOCKINETTE 6  STRL (DRAPES) ×1
STOCKINETTE 6 STRL (DRAPES) ×1 IMPLANT
SUCTION FRAZIER TIP 10 FR DISP (SUCTIONS) IMPLANT
SUT ETHILON 3 0 PS 1 (SUTURE) ×2 IMPLANT
SUT ETHILON 4 0 PS 2 18 (SUTURE) IMPLANT
SUT MNCRL AB 3-0 PS2 18 (SUTURE) ×2 IMPLANT
SUT MNCRL AB 4-0 PS2 18 (SUTURE) IMPLANT
SUT VIC AB 2-0 SH 27 (SUTURE) ×2
SUT VIC AB 2-0 SH 27XBRD (SUTURE) ×1 IMPLANT
SUT VIC AB 3-0 PS1 18 (SUTURE)
SUT VIC AB 3-0 PS1 18XBRD (SUTURE) IMPLANT
SYR BULB 3OZ (MISCELLANEOUS) ×2 IMPLANT
SYR CONTROL 10ML LL (SYRINGE) IMPLANT
TOWEL OR 17X24 6PK STRL BLUE (TOWEL DISPOSABLE) ×2 IMPLANT
TOWEL OR NON WOVEN STRL DISP B (DISPOSABLE) ×1 IMPLANT
TUBE CONNECTING 20X1/4 (TUBING) IMPLANT
UNDERPAD 30X30 INCONTINENT (UNDERPADS AND DIAPERS) ×2 IMPLANT
YANKAUER SUCT BULB TIP NO VENT (SUCTIONS) IMPLANT

## 2013-12-15 NOTE — Progress Notes (Signed)
Assisted Dr. Crews with right, ultrasound guided, popliteal/saphenous block. Side rails up, monitors on throughout procedure. See vital signs in flow sheet. Tolerated Procedure well. 

## 2013-12-15 NOTE — Anesthesia Preprocedure Evaluation (Addendum)
Anesthesia Evaluation  Patient identified by MRN, date of birth, ID band Patient awake    Reviewed: Allergy & Precautions, NPO status   Airway Mallampati: I TM Distance: >3 FB Neck ROM: Full    Dental  (+) Teeth Intact and Dental Advisory Given   Pulmonary  breath sounds clear to auscultation        Cardiovascular Rhythm:Regular     Neuro/Psych  Headaches,    GI/Hepatic   Endo/Other  Hypothyroidism   Renal/GU      Musculoskeletal   Abdominal   Peds  Hematology   Anesthesia Other Findings   Reproductive/Obstetrics                          Anesthesia Physical Anesthesia Plan  ASA: II  Anesthesia Plan: General   Post-op Pain Management:    Induction: Intravenous  Airway Management Planned: LMA  Additional Equipment:   Intra-op Plan:   Post-operative Plan: Extubation in OR  Informed Consent: I have reviewed the patients History and Physical, chart, labs and discussed the procedure including the risks, benefits and alternatives for the proposed anesthesia with the patient or authorized representative who has indicated his/her understanding and acceptance.   Dental advisory given  Plan Discussed with: CRNA, Anesthesiologist and Surgeon  Anesthesia Plan Comments:         Anesthesia Quick Evaluation

## 2013-12-15 NOTE — Anesthesia Postprocedure Evaluation (Signed)
  Anesthesia Post-op Note  Patient: Shelly Wilkinson  Procedure(s) Performed: Procedure(s): RIGHT HALLUX CHEILECTOMY (Right)  Patient Location: PACU  Anesthesia Type:GA combined with regional for post-op pain  Level of Consciousness: awake, alert  and oriented  Airway and Oxygen Therapy: Patient Spontanous Breathing and Patient connected to face mask oxygen  Post-op Pain: none  Post-op Assessment: Post-op Vital signs reviewed  Post-op Vital Signs: Reviewed  Complications: No apparent anesthesia complications

## 2013-12-15 NOTE — Anesthesia Procedure Notes (Addendum)
Anesthesia Regional Block:  Popliteal block  Pre-Anesthetic Checklist: ,, timeout performed, Correct Patient, Correct Site, Correct Laterality, Correct Procedure, Correct Position, site marked, Risks and benefits discussed,  Surgical consent,  Pre-op evaluation,  At surgeon's request and post-op pain management  Laterality: Right and Lower  Prep: chloraprep       Needles:  Injection technique: Single-shot  Needle Type: Echogenic Needle     Needle Length: 9cm  Needle Gauge: 21 and 21 G    Additional Needles:  Procedures: ultrasound guided (picture in chart) Popliteal block Narrative:  Start time: 12/15/2013 7:05 AM End time: 12/15/2013 7:14 AM Injection made incrementally with aspirations every 5 mL.  Performed by: Personally  Anesthesiologist: Lorrene Reid, MD  Additional Notes: After completion of the popliteal block the pt was turned supine and a saphenous block was done under similar conditions as above.  20 ml of mixed Naropin 0,5% and Marcaine.5% was given under US guidance.  Tolerated well.   Procedure Name: LMA Insertion Date/Time: 12/15/2013 7:35 AM Performed by: Emaley Applin Pre-anesthesia Checklist: Patient identified, Emergency Drugs available, Suction available and Patient being monitored Patient Re-evaluated:Patient Re-evaluated prior to inductionOxygen Delivery Method: Circle System Utilized Preoxygenation: Pre-oxygenation with 100% oxygen Intubation Type: IV induction Ventilation: Mask ventilation without difficulty LMA: LMA inserted LMA Size: 4.0 Number of attempts: 1 Airway Equipment and Method: bite block Placement Confirmation: positive ETCO2 Tube secured with: Tape Dental Injury: Teeth and Oropharynx as per pre-operative assessment

## 2013-12-15 NOTE — Transfer of Care (Signed)
Immediate Anesthesia Transfer of Care Note  Patient: Shelly Wilkinson  Procedure(s) Performed: Procedure(s): RIGHT HALLUX CHEILECTOMY (Right)  Patient Location: PACU  Anesthesia Type:GA combined with regional for post-op pain  Level of Consciousness: awake and patient cooperative  Airway & Oxygen Therapy: Patient Spontanous Breathing and Patient connected to face mask oxygen  Post-op Assessment: Report given to PACU RN and Post -op Vital signs reviewed and stable  Post vital signs: Reviewed and stable  Complications: No apparent anesthesia complications

## 2013-12-15 NOTE — Brief Op Note (Signed)
12/15/2013  8:40 AM  PATIENT:  Shelly Wilkinson  42 y.o. female  PRE-OPERATIVE DIAGNOSIS:  Right hallux rigidus  POST-OPERATIVE DIAGNOSIS:  Right hallux rigidus  Procedure(s): RIGHT HALLUX CHEILECTOMY  SURGEON:  Wylene Simmer, MD  ASSISTANT: n/a  ANESTHESIA:   General, regional  EBL:  minimal   TOURNIQUET:   Total Tourniquet Time Documented: Thigh (Right) - 19 minutes Total: Thigh (Right) - 19 minutes   COMPLICATIONS:  None apparent  DISPOSITION:  Extubated, awake and stable to recovery.  DICTATION ID:  [id number]

## 2013-12-16 ENCOUNTER — Encounter (HOSPITAL_BASED_OUTPATIENT_CLINIC_OR_DEPARTMENT_OTHER): Payer: Self-pay | Admitting: Orthopedic Surgery

## 2013-12-16 NOTE — Op Note (Signed)
NAMEMYKALA, MCCREADY NO.:  [id number]  MEDICAL RECORD NO.:  [id number]  LOCATION:  [facility]                           FACILITY:  PHYSICIAN:  Wylene Simmer, MD        DATE OF BIRTH:  15-May-1971  DATE OF PROCEDURE:  12/15/2013 DATE OF DISCHARGE:  12/15/2013                              OPERATIVE REPORT   PREOPERATIVE DIAGNOSES:  Right hallux rigidus.  POSTOPERATIVE DIAGNOSES:  Right hallux rigidus.  PROCEDURE:  Right hallux metatarsophalangeal joint cheilectomy.  SURGEON:  Wylene Simmer, MD.  ANESTHESIA:  General, regional.  ESTIMATED BLOOD LOSS:  Minimal.  TOURNIQUET TIME:  19 minutes at 225 mmHg.  COMPLICATIONS:  None apparent.  DISPOSITION:  Extubated, awake, and stable to recovery.  INDICATIONS FOR PROCEDURE:  The patient is a 42 year old woman with a painful right forefoot hallux rigidus condition.  She has failed nonoperative treatment today including activity modification, oral anti- inflammatories, and shoe wear modification.  She presents now for hallux MP joint cheilectomy and joint debridement.  She understands the risks and benefits, the alternative treatment options and elects surgical treatment.  She specifically understands the risks of bleeding, infection, nerve damage, blood clots, need for additional surgery, amputation, and death.  PROCEDURE IN DETAIL:  After preoperative consent was obtained, the correct operative site was identified.  The patient was brought to the operating room and placed supine on the operating table.  General anesthesia was induced.  Preoperative antibiotics were administered. Surgical time-out was taken.  The right lower extremity was prepped and draped in standard sterile fashion, the tourniquet around the thigh. The extremity was exsanguinated and the tourniquet was inflated to 220 mmHg.  A longitudinal incision was then made over the hallux MP joint. Sharp dissection was carried down through the skin and  subcutaneous tissue.  The extensor hallucis longus and brevis tendons were mobilized and retracted laterally.  There were protected throughout the case.  The collateral ligaments were released.  Large dorsal osteophyte was identified with full-thickness cartilage loss over the dorsal 1/3 of the joint.  The oscillating saw was then used to resect the dorsal osteophyte and the dorsal 25% of the bone.  The remaining osteophytes were trimmed with a rongeur.  The sesamoid articulations were freed up with a joker elevator.  The hallux MP joint was noted to have adequate range of motion.  A  0.0625 K-wire was then used to perforate the exposed subchondral bone at the dorsal aspect of the joint in multiple locations.  The wound was irrigated copiously.  Joint capsule was repaired over the joint with 2-0 Vicryl simple sutures.  The subcutaneous tissue was approximated with inverted simple sutures of 3-0 Monocryl and a running 3-0 nylon was used to close the skin incision. Sterile dressings were applied followed by compression dressing.  The tourniquet was released at 19 minutes after application of the dressings.  The patient was awakened from anesthesia and transported to the recovery room in stable condition.  FOLLOWUP PLAN:  The patient will be weightbearing as tolerated on her right foot in a flat postop shoe.  She will follow up with me in 2 weeks for suture removal.  She will initiate active range of motion, as she tolerates.     Wylene Simmer, MD     JH/MEDQ  D:  12/15/2013  T:  12/16/2013  Job:  [id number]

## 2013-12-16 NOTE — Op Note (Deleted)
NAMEBOBI, DAUDELIN NO.:  [id number]  MEDICAL RECORD NO.:  [id number]  LOCATION:  [facility]                           FACILITY:  PHYSICIAN:  Wylene Simmer, MD        DATE OF BIRTH:  1971/07/05  DATE OF PROCEDURE:  12/15/2013 DATE OF DISCHARGE:  12/15/2013                              OPERATIVE REPORT   PREOPERATIVE DIAGNOSES: 1. Left hallux rigidus. 2. Right hallux rigidus.  POSTOPERATIVE DIAGNOSES: 1. Left hallux rigidus. 2. Right hallux rigidus.  PROCEDURE:  Right hallux metatarsophalangeal joint cheilectomy.  SURGEON:  Wylene Simmer, MD.  ANESTHESIA:  General, regional.  ESTIMATED BLOOD LOSS:  Minimal.  TOURNIQUET TIME:  19 minutes at 225 mmHg.  COMPLICATIONS:  None apparent.  DISPOSITION:  Extubated, awake, and stable to recovery.  INDICATIONS FOR PROCEDURE:  The patient is a 42 year old woman with a painful right forefoot hallux rigidus condition.  She has failed nonoperative treatment today including activity modification, oral anti- inflammatories, and shoe wear modification.  She presents now for hallux MP joint cheilectomy and joint debridement.  She understands the risks and benefits, the alternative treatment options and elects surgical treatment.  She specifically understands the risks of bleeding, infection, nerve damage, blood clots, need for additional surgery, amputation, and death.  PROCEDURE IN DETAIL:  After preoperative consent was obtained, the correct operative site was identified.  The patient was brought to the operating room and placed supine on the operating table.  General anesthesia was induced.  Preoperative antibiotics were administered. Surgical time-out was taken.  The right lower extremity was prepped and draped in standard sterile fashion, the tourniquet around the thigh. The extremity was exsanguinated and the tourniquet was inflated to 220 mmHg.  A longitudinal incision was then made over the hallux MP joint. Sharp  dissection was carried down through the skin and subcutaneous tissue.  The extensor hallucis longus and brevis tendons were mobilized and retracted laterally.  There were protected throughout the case.  The collateral ligaments were released.  Large dorsal osteophyte was identified with full-thickness cartilage loss over the dorsal 1/3 of the joint.  The oscillating saw was then used to resect the dorsal osteophyte and the dorsal 25% of the bone.  The remaining osteophytes were trimmed with a rongeur.  The sesamoid articulations were freed up with a joker elevator.  The hallux MP joint was noted to have adequate range of motion.  A  0.0625 K-wire was then used to perforate the exposed subchondral bone at the dorsal aspect of the joint in multiple locations.  The wound was irrigated copiously.  Joint capsule was repaired over the joint with 2-0 Vicryl simple sutures.  The subcutaneous tissue was approximated with inverted simple sutures of 3-0 Monocryl and a running 3-0 nylon was used to close the skin incision. Sterile dressings were applied followed by compression dressing.  The tourniquet was released at 19 minutes after application of the dressings.  The patient was awakened from anesthesia and transported to the recovery room in stable condition.  FOLLOWUP PLAN:  The patient will be weightbearing as tolerated on her right foot in a flat postop shoe.  She will follow up with  me in 2 weeks for suture removal.  She will initiate active range of motion, as she tolerates.     Wylene Simmer, MD     JH/MEDQ  D:  12/15/2013  T:  12/16/2013  Job:  [id number]

## 2014-01-05 ENCOUNTER — Telehealth: Payer: Self-pay | Admitting: Internal Medicine

## 2014-01-05 NOTE — Telephone Encounter (Signed)
Patient using suppositories for the last few days.  She is no longer using lotronex.  She has has changed her diet and her BM are normal.  She is having pain from the hemorrhoids.  Can she try the banding again?  She states she has pain now and is willing to try banding again "Im already in pain".  She is scheduled to come in and be seen tomorrow at 3:15. She will try Rectiv for pain until office visit tomorrow.

## 2014-01-05 NOTE — Telephone Encounter (Signed)
OK - will assess in office but unlikely to band if in pain

## 2014-01-06 ENCOUNTER — Ambulatory Visit (INDEPENDENT_AMBULATORY_CARE_PROVIDER_SITE_OTHER): Payer: BC Managed Care – PPO | Admitting: Internal Medicine

## 2014-01-06 ENCOUNTER — Encounter: Payer: Self-pay | Admitting: Internal Medicine

## 2014-01-06 VITALS — BP 90/60 | HR 64 | Ht 61.81 in | Wt 115.0 lb

## 2014-01-06 DIAGNOSIS — K589 Irritable bowel syndrome without diarrhea: Secondary | ICD-10-CM

## 2014-01-06 DIAGNOSIS — K648 Other hemorrhoids: Secondary | ICD-10-CM

## 2014-01-06 NOTE — Assessment & Plan Note (Signed)
Better with diet changes - off Lotronex

## 2014-01-06 NOTE — Assessment & Plan Note (Addendum)
Refer to Dr. Delaney Meigs baths benefiber, avoid straining. She has significant grade 3 prolapsed hemorrhoids in the right anterior right posterior positions it appears. I explained this might require something more aggressive than banding.

## 2014-01-06 NOTE — Progress Notes (Signed)
         Subjective:    Patient ID: Shelly Wilkinson, female    DOB: 11/08/1971, 43 y.o.   MRN: [id number]  HPI She is here because of rectal pain and bleeding. She has a history of hemorrhoids and irritable bowel syndrome. She had been noticing some spotting of blood on the toilet paper, after defecation. Then over the past few days she's had increasing pain, protrusion from the rectal area, and a lot of spontaneous bleeding. Recticare has provided the most relief. Soaking in the pelvis health was well. She says that she has stopped using Lotronex is attended to constipate her, and she has gone to in the limitations diet over time and at this point is moving her bowels well without diarrhea and without straining and there is rare abdominal pain. So her IBS is significantly improved.  In the past we have tried to band her hemorrhoids, but she could not tolerate the feeling of the rubber band so I removed the rubber band.  Medications, allergies, past medical history, past surgical history, family history and social history are reviewed and updated in the EMR.   Review of Systems As above    Objective:   Physical Exam Well developed well-nourished white woman in no acute distress Rectal exam with female staff present  There is a moderately large right anterior and posterior hemorrhoid prolapse. This is manually reduced.      Assessment & Plan:   1. Internal hemorrhoids with bleeding and Grade III prolapse   2. Irritable bowel syndrome

## 2014-01-06 NOTE — Patient Instructions (Addendum)
Today you have been given a Benefiber sheet to read and follow.  Use your recticare and colace as needed and also sitz baths.   You have been scheduled for an appointment with Dr. Leighton Ruff at [facility]. Your appointment is on 01-10-2014 at 1030am. Please arrive at 1000am for registration. Make certain to bring a list of current medications, including any over the counter medications or vitamins. Also bring your co-pay if you have one as well as your insurance cards. [facility] is located at [address], [address]. Should you need to reschedule your appointment, please contact them at [phone].  __________________________________________________________________________________________________________________________________________________________________  Pearson Grippe A sitz bath is a warm water bath taken in the sitting position that covers only the hips and buttocks. It may be used for either healing or hygiene purposes. Sitz baths are also used to relieve pain, itching, or muscle spasms. The water may contain medicine. Moist heat will help you heal and relax.  HOME CARE INSTRUCTIONS  Take 3 to 4 sitz baths a day. 1. Fill the bathtub half full with warm water. 2. Sit in the water and open the drain a little. 3. Turn on the warm water to keep the tub half full. Keep the water running constantly. 4. Soak in the water for 15 to 20 minutes. 5. After the sitz bath, pat the affected area dry first. SEEK MEDICAL CARE IF:  You get worse instead of better. Stop the sitz baths if you get worse. MAKE SURE YOU:  Understand these instructions.  Will watch your condition.  Will get help right away if you are not doing well or get worse. Document Released: 09/06/2004 Document Revised: 09/08/2012 Document Reviewed: 03/14/2011 [facility] Patient Information 2014 [place], [place].     I appreciate the opportunity to care for you.

## 2014-01-10 ENCOUNTER — Encounter (INDEPENDENT_AMBULATORY_CARE_PROVIDER_SITE_OTHER): Payer: Self-pay | Admitting: General Surgery

## 2014-01-10 ENCOUNTER — Ambulatory Visit (INDEPENDENT_AMBULATORY_CARE_PROVIDER_SITE_OTHER): Payer: BC Managed Care – PPO | Admitting: General Surgery

## 2014-01-10 VITALS — BP 112/68 | HR 68 | Temp 97.8°F | Resp 14 | Ht 62.0 in | Wt 112.6 lb

## 2014-01-10 DIAGNOSIS — K648 Other hemorrhoids: Secondary | ICD-10-CM

## 2014-01-10 MED ORDER — HYDROCORTISONE ACETATE 25 MG RE SUPP: 25.0000 mg | Freq: Every day | RECTAL | Status: DC

## 2014-01-10 NOTE — Patient Instructions (Signed)

## 2014-01-10 NOTE — Progress Notes (Signed)
Chief Complaint  Patient presents with  . New Evaluation    eval hems    HISTORY: Shelly Wilkinson is a 43 y.o. female who presents to the office with rectal bleeding.  Other symptoms include anal pain, urgency and weight loss.  This had been occurring for several years.  She has tried banding in the past with no success.  She has tried OTC treatments, Rx suppositories and sitz baths in the past as well with some success.  Constipation makes the symptoms worse.   It is intermittent in nature.  Her bowel habits are more regular and her bowel movements are usually firm.  Her fiber intake is dietary and through a supplement.  Her last colonoscopy was in 2011 and was normal per pt.  She does have prolapsing tissue.  She has been feeling better over the past few days on an elimination diet and using colace a benefiber.     Past Medical History  Diagnosis Date  . History of hypercholesterolemia   . Hypothyroidism   . Arthritis of big toe   . Migraines   . Post - coital bleeding   . IC (interstitial cystitis)   . IBS (irritable bowel syndrome)   . Wears glasses   . Complication of anesthesia     headaches  . Hyperlipidemia       Past Surgical History  Procedure Laterality Date  . Cryotherapy      of cervix  . Leep      05/29/11  . Endometrial ablation    . Dilation and curettage of uterus    . Cystoscopy    . Colonoscopy  10/02/2010  . Cheilectomy Right 12/15/2013    Procedure: RIGHT HALLUX CHEILECTOMY;  Surgeon: Wylene Simmer, MD;  Location: [facility];  Service: Orthopedics;  Laterality: Right;  . Foot surgery      bone spurs removed from feet  . Hand surgery      severed a nerve in left hand        Current Outpatient Prescriptions  Medication Sig Dispense Refill  . ACZONE 5 % topical gel       . AMBULATORY NON FORMULARY MEDICATION Nitroglycerine ointment 0.125 %  Apply a pea sized amount internally two to three times a day Dispense 30 GM zero refill  30 g  3  .  aspirin 325 MG tablet Take 325 mg by mouth daily.      Marland Kitchen BOTOX 100 UNITS SOLR injection       . cholecalciferol (VITAMIN D) 1000 UNITS tablet Take 2,000 Units by mouth daily.       Marland Kitchen dihydroergotamine (MIGRANAL) 4 MG/ML nasal spray Place 1 spray into the nose as needed.       . docusate sodium (COLACE) 50 MG capsule Take 50 mg by mouth 2 (two) times daily.      Marland Kitchen gabapentin (NEURONTIN) 300 MG capsule Take 900 mg by mouth 2 (two) times daily.       . hydrocortisone (ANUSOL-HC) 25 MG suppository Place 1 suppository (25 mg total) rectally at bedtime. For 5 nights then as needed in 5 night intervals  24 suppository  1  . metoCLOPramide (REGLAN) 10 MG tablet Take 10 mg by mouth as needed.       . pentosan polysulfate (ELMIRON) 100 MG capsule Take 100 mg by mouth 2 (two) times daily.      Marland Kitchen spironolactone (ALDACTONE) 100 MG tablet 100 mg every evening.       Marland Kitchen  SYNTHROID 75 MCG tablet Alternates 89m and 88 mg      . tazarotene (TAZORAC) 0.05 % cream Apply topically at bedtime.      . Wheat Dextrin (BENEFIBER) POWD Take by mouth.       No current facility-administered medications for this visit.      Allergies  Allergen Reactions  . Sulfonamide Derivatives     REACTION: hives/fever      Family History  Problem Relation Age of Onset  . Diabetes Father   . Heart disease Father   . Heart attack Father   . Hypertension Father   . Colitis Father   . Heart attack Paternal Grandmother   . Rheum arthritis Maternal Grandmother   . Colon cancer Maternal Aunt   . Cancer Son     colon  . Cancer Paternal Grandfather     lung    History   Social History  . Marital Status: Married    Spouse Name: N/A    Number of Children: 2  . Years of Education: N/A   Occupational History  . tutor    Social History Main Topics  . Smoking status: Never Smoker   . Smokeless tobacco: Never Used  . Alcohol Use: Yes     Comment: rarely  . Drug Use: No  . Sexual Activity: Yes    Partners: Male    Other Topics Concern  . None   Social History Narrative  . None      REVIEW OF SYSTEMS - PERTINENT POSITIVES ONLY: Review of Systems - General ROS: negative for - chills, fever or weight loss Hematological and Lymphatic ROS: negative for - bleeding problems, blood clots or bruising Respiratory ROS: no cough, shortness of breath, or wheezing Cardiovascular ROS: no chest pain or dyspnea on exertion Gastrointestinal ROS: no abdominal pain, change in bowel habits, or black or bloody stools Genito-Urinary ROS: no dysuria, trouble voiding, or hematuria  EXAM: Filed Vitals:   01/10/14 1021  BP: 112/68  Pulse: 68  Temp: 97.8 F (36.6 C)  Resp: 14    General appearance: alert and cooperative Resp: clear to auscultation bilaterally Cardio: regular rate and rhythm GI: normal findings: soft, non-tender   Procedure: Anoscopy Surgeon: TMarcello MooresDiagnosis: rectal bleeding  Assistant: GAlphonzo SeveranceAfter the risks and benefits were explained, verbal consent was obtained for above procedure  Anesthesia: none Findings: Grade 2 and 3 inflamed internal hemorrhoids, min skin tags externally    ASSESSMENT AND PLAN:  Shelly Wilkinson is a 43y.o. F with chronically bleeding internal hemorrhoids and anal pain.  On exam she has grade 2 and 3 internal hemorrhoids that are inflamed.  We discussed options for treatment.  These include continuing the fiber and using suppositories to calm down her inflammation, banding in the office (this has a 50% success rate for grade 3 hemorrhoids and approximately 80% with grade 2).  We also discussed surgical options which mainly include trans hemorrhoidal dearterialization and standard hemorrhoidectomy. She would like to avoid surgery if at all possible. I have recommended that she continue the Benefiber once or twice a day and drink plenty of non-caffeinated fluids. She will use of suppositories for any anal inflammation. I will see her back in 6-8 weeks to evaluate  her progress. If she is still having issues with bleeding, I think it would be reasonable to try banding is a nonsurgical option. We discussed that this may take multiple treatments to get her hemorrhoids under control. We will save surgery as  a last resort.   Rosario Adie, MD Colon and Rectal Surgery / [facility]      Visit Diagnoses: 1. Internal bleeding hemorrhoids     Primary Care Physician: Osborne Casco, MD

## 2014-02-13 ENCOUNTER — Telehealth (INDEPENDENT_AMBULATORY_CARE_PROVIDER_SITE_OTHER): Payer: Self-pay

## 2014-02-13 NOTE — Telephone Encounter (Signed)
Pt called to confirm usage of her suppositories were prn.  I read Dr. Marcello Moores' last office note and confirmed the same.  Pt will be here for her f/u on 02/16/14.

## 2014-02-16 ENCOUNTER — Ambulatory Visit (INDEPENDENT_AMBULATORY_CARE_PROVIDER_SITE_OTHER): Payer: BC Managed Care – PPO | Admitting: General Surgery

## 2014-02-16 ENCOUNTER — Encounter (INDEPENDENT_AMBULATORY_CARE_PROVIDER_SITE_OTHER): Payer: Self-pay

## 2014-02-16 ENCOUNTER — Encounter (INDEPENDENT_AMBULATORY_CARE_PROVIDER_SITE_OTHER): Payer: Self-pay | Admitting: General Surgery

## 2014-02-16 VITALS — BP 118/70 | HR 74 | Temp 97.8°F | Resp 18 | Ht 62.5 in | Wt 115.6 lb

## 2014-02-16 DIAGNOSIS — K648 Other hemorrhoids: Secondary | ICD-10-CM

## 2014-02-16 HISTORY — PX: HEMORRHOID BANDING: SHX5850

## 2014-02-16 NOTE — Patient Instructions (Signed)
Patient Information following hemorrhoid banding  Hemorrhoid banding is a procedure that places a small rubber band around a hemorrhoid, causing it to clot and then break off and be passed in the stool.  This is a minor procedure, but problems may develop in rare cases.  The following are warning symptoms and signs that should alert you to a possible complication.  Please contact the office if any of these should occur:   Increased pain with bowel movements or sitting  Temperature over 100.4 F (oral)  Bleeding that is excessive (over one cup of clots or blood)  Redness or irritation outside the anus  Difficulty urinating  For your comfort please follow these instructions:   Maintain a high fiber diet so that your bowel movements will be soft  Take a fiber supplement twice a day (such as Metamucil, Benefiber, Citracel or Fibercon)  Sit in a tub of warm water 2-3 times a day for the first 2-3 days, as needed to soothe the area.  You may expect some pressure sensations in the anal area for 1-2 days  If you need pain medication, take Tylenol not aspirin or Ibuprofen products.  In 7-10 days, the banded tissue and rubber band will pass with your stool.  Occasionally there is some bleeding after this.  If this bleeding seems excessive, call the office immediately or go to the Emergency Room.    Make an appointment to see me in 2-3 weeks after the procedure

## 2014-02-16 NOTE — Progress Notes (Signed)
Shelly Wilkinson is a 43 y.o. female who is here for a follow up visit regarding her rectal bleeding.  Was feeling better.  Took some ibuprofen and had some bleeding.    Objective: Filed Vitals:   02/16/14 1415  BP: 118/70  Pulse: 74  Temp: 97.8 F (36.6 C)  Resp: 18    General appearance: alert and cooperative GI: normal findings: soft, non-tender  The anatomy & physiology of the anorectal region was discussed.  The pathophysiology of hemorrhoids and differential diagnosis was discussed.  Natural history progression  was discussed.   I stressed the importance of a bowel regimen to have daily soft bowel movements to minimize progression of disease.     The patient's symptoms are not adequately controlled.  Therefore, I recommended banding to treat the hemorrhoids.  I went over the technique, risks, benefits, and alternatives.   Goals of post-operative recovery were discussed as well.  Questions were answered.  The patient expressed understanding & wished to proceed.  The patient was positioned in the prone position on the proctology table.  Perianal & rectal examination was done.  Using anoscopy, I ligated the hemorrhoids above the dentate line with banding.  The patient tolerated the procedure well.  Educational handouts further explaining the pathology, treatment options, and bowel regimen were given as well.   Assessment and Plan: S/P banding.  Will RTO in 3 weeks.     Rosario Adie, MD [facility], [place]

## 2014-03-01 ENCOUNTER — Encounter (INDEPENDENT_AMBULATORY_CARE_PROVIDER_SITE_OTHER): Payer: Self-pay | Admitting: General Surgery

## 2014-03-01 ENCOUNTER — Ambulatory Visit (INDEPENDENT_AMBULATORY_CARE_PROVIDER_SITE_OTHER): Payer: BC Managed Care – PPO | Admitting: General Surgery

## 2014-03-01 VITALS — BP 118/60 | HR 80 | Temp 98.8°F | Resp 14 | Ht 62.5 in | Wt 117.6 lb

## 2014-03-01 DIAGNOSIS — K648 Other hemorrhoids: Secondary | ICD-10-CM

## 2014-03-01 NOTE — Patient Instructions (Signed)
Patient Information following hemorrhoid banding  Hemorrhoid banding is a procedure that places a small rubber band around a hemorrhoid, causing it to clot and then break off and be passed in the stool.  This is a minor procedure, but problems may develop in rare cases.  The following are warning symptoms and signs that should alert you to a possible complication.  Please contact the office if any of these should occur:   Increased pain with bowel movements or sitting  Temperature over 100.4 F (oral)  Bleeding that is excessive (over one cup of clots or blood)  Redness or irritation outside the anus  Difficulty urinating  For your comfort please follow these instructions:   Maintain a high fiber diet so that your bowel movements will be soft  Take a fiber supplement twice a day (such as Metamucil, Benefiber, Citracel or Fibercon)  Sit in a tub of warm water 2-3 times a day for the first 2-3 days, as needed to soothe the area.  You may expect some pressure sensations in the anal area for 1-2 days  If you need pain medication, take Tylenol not aspirin or Ibuprofen products.  In 7-10 days, the banded tissue and rubber band will pass with your stool.  Occasionally there is some bleeding after this.  If this bleeding seems excessive, call the office immediately or go to the Emergency Room.    Make an appointment to see me in 2-3 weeks after the procedure

## 2014-03-01 NOTE — Progress Notes (Signed)
Shelly Wilkinson is a 43 y.o. female who is status post a hemorrhoid banding on 2/19.  She reports that the bleeding has gotten better but hasn't stopped completely.  She still notices some prolapse.    Objective: Filed Vitals:   03/01/14 1434  BP: 118/60  Pulse: 80  Temp: 98.8 F (37.1 C)  Resp: 14    General appearance: alert and cooperative GI: normal findings: soft, non-tender  The anatomy & physiology of the anorectal region was discussed.  The pathophysiology of hemorrhoids and differential diagnosis was discussed.  Natural history progression  was discussed.   I stressed the importance of a bowel regimen to have daily soft bowel movements to minimize progression of disease.     The patient's symptoms are not adequately controlled.  Therefore, I recommended banding to treat the hemorrhoids.  I went over the technique, risks, benefits, and alternatives.   Goals of post-operative recovery were discussed as well.  Questions were answered.  The patient expressed understanding & wished to proceed.  The patient was positioned in the prone position on the proctology table.  Perianal & rectal examination was done.  Using anoscopy, I ligated the hemorrhoids above the dentate line with banding.  The patient tolerated the procedure well.  Educational handouts further explaining the pathology, treatment options, and bowel regimen were given as well.    Assessment: s/p  Patient Active Problem List   Diagnosis Date Noted  . Internal hemorrhoids with bleeding and Grade III prolapse 07/05/2013  . Interstitial cystitis 10/19/2012  . Migraine headache 10/19/2012  . Irritable bowel syndrome 09/11/2010    Plan: We have repeated banding today to her inflamed R ant and post internal hemorrhoids.  I will see her back in 3-4 weeks for recheck.      Rosario Adie, [facility], [place]   03/01/2014 2:39 PM

## 2014-03-22 ENCOUNTER — Encounter (INDEPENDENT_AMBULATORY_CARE_PROVIDER_SITE_OTHER): Payer: BC Managed Care – PPO | Admitting: General Surgery

## 2014-04-28 ENCOUNTER — Telehealth: Payer: Self-pay | Admitting: Internal Medicine

## 2014-04-28 NOTE — Telephone Encounter (Signed)
Left message for patient to call back  

## 2014-04-28 NOTE — Telephone Encounter (Signed)
Patient advised to try Miralax 1/2 -1 capful daily.  She can titrate as needed to TID if needed

## 2014-05-01 ENCOUNTER — Ambulatory Visit (INDEPENDENT_AMBULATORY_CARE_PROVIDER_SITE_OTHER): Payer: BC Managed Care – PPO | Admitting: General Surgery

## 2014-05-01 ENCOUNTER — Telehealth (INDEPENDENT_AMBULATORY_CARE_PROVIDER_SITE_OTHER): Payer: Self-pay | Admitting: General Surgery

## 2014-05-01 ENCOUNTER — Encounter (INDEPENDENT_AMBULATORY_CARE_PROVIDER_SITE_OTHER): Payer: Self-pay | Admitting: General Surgery

## 2014-05-01 VITALS — BP 94/60 | HR 80 | Temp 97.4°F | Resp 14 | Ht 62.5 in | Wt 120.4 lb

## 2014-05-01 DIAGNOSIS — K648 Other hemorrhoids: Secondary | ICD-10-CM

## 2014-05-01 MED ORDER — HYDROCORTISONE ACETATE 25 MG RE SUPP: 25.0000 mg | Freq: Every day | RECTAL | Status: DC

## 2014-05-01 NOTE — Patient Instructions (Signed)

## 2014-05-01 NOTE — Progress Notes (Signed)
Shelly Wilkinson is a 43 y.o. female who is status post a hemorrhoid banding on 2/19 and 3/4. She reports that the bleeding has gotten better but hasn't stopped completely. She still notices some prolapse, especially with physical activity.  Objective:  Filed Vitals:   05/01/14 1419  BP: 94/60  Pulse: 80  Temp: 97.4 F (36.3 C)  Resp: 14   General appearance: alert and cooperative  GI: normal findings: soft, non-tender   Procedure: Anoscopy Surgeon: Marcello Moores Assistant: Hendricks After the risks and benefits were explained, verbal consent was obtained for above procedure  Anesthesia: none Diagnosis: rectal bleeding Findings: inflamed, prolapsed grade 2 and grade 3 internal hemorrhoids   s/p  Patient Active Problem List    Diagnosis  Date Noted   .  Internal hemorrhoids with bleeding and Grade III prolapse  07/05/2013   .  Interstitial cystitis  10/19/2012   .  Migraine headache  10/19/2012   .  Irritable bowel syndrome  09/11/2010    Plan:  [name] is a 43 y.o. F with grade 2 and 3 internal hemorrhoids that has failed to improve with office procedures.  I have recommended [place].  We discussed this in detail.  We discussed that this is the most likely procedure to control her symptoms permanently. We discussed the risks of the procedure, which include bleeding, pain, urinary retention and a small chance of recurrence.  She is in agreement to this.    Rosario Adie, [facility], [place]  03/01/2014  2:39 PM

## 2014-05-09 ENCOUNTER — Telehealth (INDEPENDENT_AMBULATORY_CARE_PROVIDER_SITE_OTHER): Payer: Self-pay

## 2014-05-09 NOTE — Telephone Encounter (Signed)
Informed patient to do fleets enema night before surgery 06-22-14 per DR. Thomas. Patient aware

## 2014-05-15 ENCOUNTER — Telehealth (INDEPENDENT_AMBULATORY_CARE_PROVIDER_SITE_OTHER): Payer: Self-pay

## 2014-05-15 NOTE — Telephone Encounter (Signed)
Patient states she is starting prednisone dose pack and will complete in one week for migraines .

## 2014-05-15 NOTE — Telephone Encounter (Signed)
Noted  

## 2014-06-09 ENCOUNTER — Telehealth (INDEPENDENT_AMBULATORY_CARE_PROVIDER_SITE_OTHER): Payer: Self-pay

## 2014-06-09 NOTE — Telephone Encounter (Signed)
Advised pt that it was ok to continue use of the suppositories per Dr. Marcello Moores, the less inflamed before surgery, the less bleeding and pain after surgery. Pt asked if she could be given a doughnut pillow, advised pt that I would ask Dr. Marcello Moores on Monday to see if a prescription is needed for that. Pt also asked about purchasing the fleets enema. Advised that they could be purchased OTC at any drug store, [facility], ect.Marland KitchenMarland KitchenMarland Kitchen

## 2014-06-19 ENCOUNTER — Encounter (INDEPENDENT_AMBULATORY_CARE_PROVIDER_SITE_OTHER): Payer: Self-pay | Admitting: General Surgery

## 2014-06-22 ENCOUNTER — Telehealth (INDEPENDENT_AMBULATORY_CARE_PROVIDER_SITE_OTHER): Payer: Self-pay | Admitting: General Surgery

## 2014-06-22 DIAGNOSIS — K648 Other hemorrhoids: Secondary | ICD-10-CM

## 2014-06-22 HISTORY — PX: TRANSANAL HEMORRHOIDAL DEARTERIALIZATION: SHX6136

## 2014-06-22 NOTE — Telephone Encounter (Signed)
Received several phone calls from [place] at surgical center. Patient had not urinated in was sent to recovery care center upstairs. Was called later that she spontaneously voided 100 cc; however she felt that she had not completely emptied her bladder. Had nurse in and out catheterize patient and had 350 cc. Gave option of going home versus staying overnight since it was 10 PM. Recommended to patient that she stay overnight just in case she had additional urinary issues. Patient is going to stay overnight

## 2014-06-23 ENCOUNTER — Other Ambulatory Visit (INDEPENDENT_AMBULATORY_CARE_PROVIDER_SITE_OTHER): Payer: Self-pay

## 2014-06-23 ENCOUNTER — Telehealth (INDEPENDENT_AMBULATORY_CARE_PROVIDER_SITE_OTHER): Payer: Self-pay

## 2014-06-23 MED ORDER — OXYCODONE-ACETAMINOPHEN 5-325 MG PO TABS: 1.0000 | ORAL_TABLET | ORAL | Status: DC | PRN

## 2014-06-23 NOTE — Telephone Encounter (Signed)
Informed pt of Dr Manon Hilding recommendations. Pt states that she will go now and soak in her tub.

## 2014-06-23 NOTE — Telephone Encounter (Signed)
Pt s/p [place] on 06/22/14 by Dr Marcello Moores. Pt stayed overnight last night due to not being able to void. Pt was discharged around 7 this am. Pt states that she still has not been able to completely empty her bladder since she has been home. Pt was also wanting to make sure that she could take Ibuprofen. Advised pt that she could take up to 861m Ibuprofen every 8 hours as needed. Pt states that she has not wanted to take her pain meds due to fear of being constipated. Advised pt to try and stay ahead of her pain to help keep it manageable. Informed pt that I would send  Dr TMarcello Mooresa message informing her of not being able to urinate. Please advise.

## 2014-06-23 NOTE — Telephone Encounter (Signed)
Would recommend sitting in a warm tub several times a day to help relax her pelvic muscles.  Can even try urinating while in the tub if that works better.

## 2014-07-11 ENCOUNTER — Telehealth (INDEPENDENT_AMBULATORY_CARE_PROVIDER_SITE_OTHER): Payer: Self-pay | Admitting: General Surgery

## 2014-07-11 NOTE — Telephone Encounter (Signed)
Message copied by Flossie Buffy on Tue Jul 11, 2014  4:56 PM ------      Message from: Dois Davenport      Created: Tue Jul 11, 2014  4:10 PM      Regarding: FW: Pt Called With a Concern      Contact: [phone]                   ----- Message -----         From: Hunt Oris         Sent: 07/04/2014  10:58 AM           To: Jacinto Reap, CMA      Subject: Pt Called With a Concern                                 Pt had bleeding last night and was told this could happened, but it was something out of the ordinary because it has been awhile.  The bleeding has stopped, but it was bright red.  Pt is concern and would like a call back.  Above number is good up until 3:30 pm...after 3:30 pm please call cell # @ [phone]....df ------

## 2014-07-11 NOTE — Telephone Encounter (Signed)
Returned patients call and explained that the bright blood is pretty normal after this surgery and that we need to focus on keeping her bowels soft and regular.  She informed me that she is having problems with consistency for bowel movements.  She explained that she is taking miralax, bene fiber, and colace. Informed her that this far out from the surgery we would like to reduce the colace.  Informed her that she could double up on the miralax and increase her fluid consumption.  Patient verbalized understanding of this.

## 2014-07-12 ENCOUNTER — Ambulatory Visit (INDEPENDENT_AMBULATORY_CARE_PROVIDER_SITE_OTHER): Payer: BC Managed Care – PPO | Admitting: General Surgery

## 2014-07-12 ENCOUNTER — Encounter (INDEPENDENT_AMBULATORY_CARE_PROVIDER_SITE_OTHER): Payer: Self-pay | Admitting: General Surgery

## 2014-07-12 ENCOUNTER — Encounter (INDEPENDENT_AMBULATORY_CARE_PROVIDER_SITE_OTHER): Payer: BC Managed Care – PPO | Admitting: General Surgery

## 2014-07-12 VITALS — BP 118/76 | HR 75 | Ht 62.0 in | Wt 120.0 lb

## 2014-07-12 DIAGNOSIS — Z9889 Other specified postprocedural states: Secondary | ICD-10-CM

## 2014-07-12 NOTE — Progress Notes (Signed)
Shelly Wilkinson is a 43 y.o. female who is status post a [place] procedure on 06/22/14.  She is doing well.  She has minimal anal symptoms or bleeding.  She is having some bloating and abd cramping on occasion.   Objective: Filed Vitals:   07/12/14 1618  BP: 118/76  Pulse: 75    General appearance: alert and cooperative GI: normal findings: soft, non-tender  Incision: healing well   Assessment: s/p  Patient Active Problem List   Diagnosis Date Noted  . Interstitial cystitis 10/19/2012  . Migraine headache 10/19/2012  . Irritable bowel syndrome 09/11/2010    Plan: Seems to be doing well.  RTO as needed.  Can try backing off fiber or probiotics for bloating/abd discomfort.  She will call the office if these symptoms fail to resolve.    Rosario Adie, [facility], [place]   07/12/2014 4:30 PM

## 2014-07-12 NOTE — Patient Instructions (Signed)
Return to the office as needed

## 2014-07-19 ENCOUNTER — Telehealth: Payer: Self-pay | Admitting: Internal Medicine

## 2014-07-19 ENCOUNTER — Encounter (INDEPENDENT_AMBULATORY_CARE_PROVIDER_SITE_OTHER): Payer: BC Managed Care – PPO | Admitting: General Surgery

## 2014-07-19 NOTE — Telephone Encounter (Signed)
Patient called to report constipation after hemorrhoid surgery.  She reports that she has been taking 1 tbsp of miralax along with 1 tbsp of benefiber bid.  She is advised to increase the Miralax to 17 gm or one capful BID.  She will call back for any additional questions or concerns

## 2014-07-31 ENCOUNTER — Telehealth: Payer: Self-pay | Admitting: Internal Medicine

## 2014-07-31 NOTE — Telephone Encounter (Signed)
Clarified dosage of Miralax with patient.  All questions answered.  She will call back for any additional questions or concerns

## 2014-08-14 ENCOUNTER — Telehealth: Payer: Self-pay | Admitting: *Deleted

## 2014-08-14 DIAGNOSIS — B3731 Acute candidiasis of vulva and vagina: Secondary | ICD-10-CM

## 2014-08-14 DIAGNOSIS — B373 Candidiasis of vulva and vagina: Secondary | ICD-10-CM

## 2014-08-14 MED ORDER — FLUCONAZOLE 150 MG PO TABS: 150.0000 mg | ORAL_TABLET | Freq: Once | ORAL | Status: DC

## 2014-08-14 NOTE — Telephone Encounter (Signed)
Per Dr Gala Romney send RX to [facility] for Diflucan 150 mg x 1.

## 2014-10-23 ENCOUNTER — Other Ambulatory Visit: Payer: Self-pay | Admitting: Obstetrics & Gynecology

## 2014-10-23 DIAGNOSIS — Z1231 Encounter for screening mammogram for malignant neoplasm of breast: Secondary | ICD-10-CM

## 2014-10-30 ENCOUNTER — Encounter (INDEPENDENT_AMBULATORY_CARE_PROVIDER_SITE_OTHER): Payer: Self-pay | Admitting: General Surgery

## 2014-11-14 ENCOUNTER — Ambulatory Visit (HOSPITAL_COMMUNITY)
Admission: RE | Admit: 2014-11-14 | Discharge: 2014-11-14 | Disposition: A | Discharge status: Home / Self Care | Payer: BC Managed Care – PPO | Source: Ambulatory Visit | Attending: Obstetrics & Gynecology | Admitting: Obstetrics & Gynecology

## 2014-11-14 DIAGNOSIS — Z1231 Encounter for screening mammogram for malignant neoplasm of breast: Secondary | ICD-10-CM

## 2014-11-21 ENCOUNTER — Telehealth: Payer: Self-pay | Admitting: Internal Medicine

## 2014-11-21 NOTE — Telephone Encounter (Signed)
Patient is asking for blood testing for food sensitivity.  She is advised that we don't offer any of that testing here.  She is advised that there are tests for lactose intolerance, but they are not performed here.  She thanked me for the information

## 2014-12-01 ENCOUNTER — Other Ambulatory Visit (INDEPENDENT_AMBULATORY_CARE_PROVIDER_SITE_OTHER): Payer: Self-pay | Admitting: General Surgery

## 2014-12-01 NOTE — H&P (Signed)
Shelly Wilkinson 12/01/2014 2:14 PM Location: [facility] Patient #: [id number] DOB: 1971-05-07 Married / Language: Shelly Wilkinson / Race: White Female History of Present Illness [name]; 85/07/8501 3:14 PM) Patient words: anal fissure.  The patient is a 43 year old female who presents with an anal fissure. She has been using diltiazem ointment for approximately 8 weeks. She has noticed a slight change in her symptoms. She is having less pain with bowel movements. She denies any bleeding. Other Problems Leighton Ruff, MD; 77/03/1286 3:14 PM) Hemorrhoids Hypercholesterolemia Migraine Headache Thyroid Disease ANAL FISSURE (565.0  K60.2) Patient is 6 months status post [facility] for bleeding internal hemorrhoids. She presents to the office with anal pain with bowel movement. She denies any bleeding. She has an anal fissure. It did not resolve with 8 weeks of diltiazem cream.  Past Surgical History Leighton Ruff, MD; 86/06/6719 3:14 PM) Foot Surgery Bilateral. Hemorrhoidectomy  Allergies (Sonya Bynum, CMA; 12/01/2014 2:16 PM) Sulfa Drugs  Medication History (Sonya Bynum, CMA; 12/01/2014 2:16 PM) DILTIAZEM Gel (2% Gel, see note application External three times daily, Taken starting 10/25/2014) Active. MiraLax (Oral) Active. Aczone (5% Gel, External) Active. Botox (100UNIT For Solution, Injection) Active. Vitamin D (400UNIT Capsule, 1 (one) Oral) Active. Migranal (4MG/ML Solution, Nasal) Active. Gabapentin (300MG Capsule, Oral) Active. Anusol-HC (25MG Suppository, Rectal) Active. Reglan (10MG Tablet, Oral) Active. Elmiron (100MG Capsule, Oral) Active. Spironolactone (100MG Tablet, Oral) Active. Tazarotene (0.05% Cream, External) Active. Synthroid (75MCG Tablet, Oral) Active.  Social History Leighton Ruff, MD; 94/06/961 3:15 PM) Alcohol use Occasional alcohol use. Caffeine use Coffee. No drug use Tobacco use Never smoker.  Family History Leighton Ruff, MD; 83/05/6293 3:15 PM) Arthritis Father, Mother. Colon Polyps Father, Mother. Diabetes Mellitus Father. Heart Disease Father. Heart disease in female family member before age 63 Hypertension Father. Ischemic Bowel Disease Father. Migraine Headache Mother. Thyroid problems Father.     Review of Systems [name]; 76/04/4649 3:16 PM) General Not Present- Appetite Loss, Chills and Fever. Respiratory Not Present- Cough and Difficulty Breathing. Cardiovascular Not Present- Chest Pain and Edema. Gastrointestinal Not Present- Abdominal Pain, Abdominal Swelling and Change in Bowel Habits. Musculoskeletal Not Present- Joint Pain, Joint Redness and Joint Stiffness. Neurological Not Present- Dizziness and Fainting. Hematology Not Present- Abnormal Bleeding and Anemia.  Vitals ([name]; 12/01/2014 2:16 PM) 12/01/2014 2:14 PM Weight: 121 lb Height: 62in Body Surface Area: 1.55 m Body Mass Index: 22.13 kg/m Temp.: 97.74F(Temporal)  Pulse: 72 (Regular)  BP: 120/80 (Sitting, Left Arm, Standard)     Physical Exam [name]; 35/03/6567 3:16 PM)  General Mental Status-Alert. General Appearance-Consistent with stated age. Hydration-Well hydrated. Voice-Normal.  Head and Neck Head-normocephalic, atraumatic with no lesions or palpable masses. Trachea-midline. Thyroid Gland Characteristics - normal size and consistency.  Eye Eyeball - Bilateral-Extraocular movements intact. Sclera/Conjunctiva - Bilateral-No scleral icterus.  Chest and Lung Exam Chest and lung exam reveals -quiet, even and easy respiratory effort with no use of accessory muscles and on auscultation, normal breath sounds, no adventitious sounds and normal vocal resonance. Inspection Chest Wall - Normal. Back - normal.  Breast Breast - Left-Symmetric, Non Tender, No Biopsy scars, no Dimpling, No Inflammation, No Lumpectomy scars, No Mastectomy scars,  No Peau d' Orange. Breast - Right-Symmetric, Non Tender, No Biopsy scars, no Dimpling, No Inflammation, No Lumpectomy scars, No Mastectomy scars, No Peau d' Orange. Breast Lump-No Palpable Breast Mass.  Cardiovascular Cardiovascular examination reveals -normal heart sounds, regular rate and rhythm with no murmurs and normal pedal  pulses bilaterally.  Abdomen Inspection Inspection of the abdomen reveals - No Hernias. Skin - Scar - no surgical scars. Palpation/Percussion Palpation and Percussion of the abdomen reveal - Soft, Non Tender, No Rebound tenderness, No Rigidity (guarding) and No hepatosplenomegaly. Auscultation Auscultation of the abdomen reveals - Bowel sounds normal.  Rectal Anorectal Exam External - Note: Fissure present. Slight healing has occurred.  Neurologic Neurologic evaluation reveals -alert and oriented x 3 with no impairment of recent or remote memory. Mental Status-Normal.  Musculoskeletal Normal Exam - Left-Upper Extremity Strength Normal and Lower Extremity Strength Normal. Normal Exam - Right-Upper Extremity Strength Normal and Lower Extremity Strength Normal.    Assessment & Plan [name]; 07/03/2262 3:17 PM)  ANAL FISSURE (565.0  K60.2) Story: Patient is 6 months status post [facility] for bleeding internal hemorrhoids. She presents to the office with anal pain with bowel movement. She denies any bleeding. She has an anal fissure. It did not resolve with 8 weeks of diltiazem cream. Impression: On exam there was only slight healing noted. I have recommended exam under anesthesia with chemicals be dry to me. Risks include bleeding, pain and recurrence. I believe she understands these risk and has agreed to proceed with surgery.

## 2014-12-15 ENCOUNTER — Encounter (HOSPITAL_BASED_OUTPATIENT_CLINIC_OR_DEPARTMENT_OTHER): Payer: Self-pay | Admitting: *Deleted

## 2014-12-15 NOTE — Progress Notes (Addendum)
NPO AFTER MN WITH EXCEPTION CLEAR LIQUIDS UNTIL 0700 (NO CREAM/ MILK PRODUCTS).  ARRIVE AT 1130. NEEDS HG AND URINE PREG.. WILL TAKE AM MEDS W/ SIPS OF WATER DOS.

## 2014-12-26 ENCOUNTER — Ambulatory Visit (HOSPITAL_BASED_OUTPATIENT_CLINIC_OR_DEPARTMENT_OTHER): Payer: BC Managed Care – PPO | Admitting: Anesthesiology

## 2014-12-26 ENCOUNTER — Encounter (HOSPITAL_BASED_OUTPATIENT_CLINIC_OR_DEPARTMENT_OTHER): Payer: Self-pay

## 2014-12-26 ENCOUNTER — Encounter (HOSPITAL_BASED_OUTPATIENT_CLINIC_OR_DEPARTMENT_OTHER)
Admission: RE | Disposition: A | Discharge status: Home / Self Care | Payer: Self-pay | Source: Ambulatory Visit | Attending: General Surgery

## 2014-12-26 ENCOUNTER — Ambulatory Visit (HOSPITAL_BASED_OUTPATIENT_CLINIC_OR_DEPARTMENT_OTHER)
Admission: RE | Admit: 2014-12-26 | Discharge: 2014-12-26 | Disposition: A | Discharge status: Home / Self Care | Payer: BC Managed Care – PPO | Source: Ambulatory Visit | Attending: General Surgery | Admitting: General Surgery

## 2014-12-26 DIAGNOSIS — K602 Anal fissure, unspecified: Secondary | ICD-10-CM | POA: Insufficient documentation

## 2014-12-26 DIAGNOSIS — G43909 Migraine, unspecified, not intractable, without status migrainosus: Secondary | ICD-10-CM | POA: Diagnosis not present

## 2014-12-26 DIAGNOSIS — I10 Essential (primary) hypertension: Secondary | ICD-10-CM | POA: Diagnosis not present

## 2014-12-26 DIAGNOSIS — Z8371 Family history of colonic polyps: Secondary | ICD-10-CM | POA: Insufficient documentation

## 2014-12-26 DIAGNOSIS — E079 Disorder of thyroid, unspecified: Secondary | ICD-10-CM | POA: Insufficient documentation

## 2014-12-26 DIAGNOSIS — E780 Pure hypercholesterolemia: Secondary | ICD-10-CM | POA: Diagnosis not present

## 2014-12-26 DIAGNOSIS — Z8249 Family history of ischemic heart disease and other diseases of the circulatory system: Secondary | ICD-10-CM | POA: Insufficient documentation

## 2014-12-26 DIAGNOSIS — Z8261 Family history of arthritis: Secondary | ICD-10-CM | POA: Diagnosis not present

## 2014-12-26 DIAGNOSIS — Z833 Family history of diabetes mellitus: Secondary | ICD-10-CM | POA: Diagnosis not present

## 2014-12-26 DIAGNOSIS — K649 Unspecified hemorrhoids: Secondary | ICD-10-CM | POA: Diagnosis not present

## 2014-12-26 HISTORY — PX: SPHINCTEROTOMY: SHX5279

## 2014-12-26 HISTORY — PX: EVALUATION UNDER ANESTHESIA WITH ANAL FISTULECTOMY: SHX5621

## 2014-12-26 HISTORY — DX: Gastro-esophageal reflux disease without esophagitis: K21.9

## 2014-12-26 HISTORY — DX: Chronic anal fissure: K60.1

## 2014-12-26 HISTORY — DX: Unspecified osteoarthritis, unspecified site: M19.90

## 2014-12-26 LAB — POCT HEMOGLOBIN-HEMACUE: Hemoglobin: 12.8 g/dL (ref 12.0–15.0)

## 2014-12-26 LAB — POCT PREGNANCY, URINE: Preg Test, Ur: NEGATIVE

## 2014-12-26 SURGERY — EXAM UNDER ANESTHESIA WITH ANAL FISTULECTOMY
Anesthesia: Monitor Anesthesia Care | Site: Rectum

## 2014-12-26 MED ORDER — LIDOCAINE 5 % EX OINT
TOPICAL_OINTMENT | CUTANEOUS | Status: DC | PRN
  Administered 2014-12-26: 1

## 2014-12-26 MED ORDER — PROPOFOL 10 MG/ML IV EMUL
INTRAVENOUS | Status: DC | PRN
  Administered 2014-12-26: 50 ug/kg/min via INTRAVENOUS

## 2014-12-26 MED ORDER — LACTATED RINGERS IV SOLN
INTRAVENOUS | Status: DC
  Administered 2014-12-26: 13:00:00 via INTRAVENOUS
  Filled 2014-12-26: qty 1000

## 2014-12-26 MED ORDER — DEXAMETHASONE SODIUM PHOSPHATE 4 MG/ML IJ SOLN
INTRAMUSCULAR | Status: DC | PRN
  Administered 2014-12-26: 4 mg via INTRAVENOUS

## 2014-12-26 MED ORDER — LACTATED RINGERS IV SOLN
INTRAVENOUS | Status: DC | PRN
  Administered 2014-12-26: 13:00:00 via INTRAVENOUS

## 2014-12-26 MED ORDER — PROMETHAZINE HCL 25 MG/ML IJ SOLN
6.2500 mg | INTRAMUSCULAR | Status: DC | PRN
Start: 2014-12-26 — End: 2014-12-26
  Filled 2014-12-26: qty 1

## 2014-12-26 MED ORDER — FENTANYL CITRATE 0.05 MG/ML IJ SOLN
INTRAMUSCULAR | Status: AC
  Filled 2014-12-26: qty 4

## 2014-12-26 MED ORDER — LIDOCAINE HCL (CARDIAC) 20 MG/ML IV SOLN
INTRAVENOUS | Status: DC | PRN
  Administered 2014-12-26: 50 mg via INTRAVENOUS

## 2014-12-26 MED ORDER — KETOROLAC TROMETHAMINE 30 MG/ML IJ SOLN
INTRAMUSCULAR | Status: DC | PRN
  Administered 2014-12-26: 30 mg via INTRAVENOUS

## 2014-12-26 MED ORDER — FENTANYL CITRATE 0.05 MG/ML IJ SOLN
INTRAMUSCULAR | Status: DC | PRN
  Administered 2014-12-26 (×4): 12.5 ug via INTRAVENOUS

## 2014-12-26 MED ORDER — MIDAZOLAM HCL 2 MG/2ML IJ SOLN
INTRAMUSCULAR | Status: AC
  Filled 2014-12-26: qty 4

## 2014-12-26 MED ORDER — DIAZEPAM 5 MG PO TABS: 5.0000 mg | ORAL_TABLET | Freq: Four times a day (QID) | ORAL | Status: DC | PRN

## 2014-12-26 MED ORDER — ONABOTULINUMTOXINA 100 UNITS IJ SOLR
INTRAMUSCULAR | Status: DC | PRN
  Administered 2014-12-26: 60 [IU] via INTRAMUSCULAR

## 2014-12-26 MED ORDER — MIDAZOLAM HCL 5 MG/5ML IJ SOLN
INTRAMUSCULAR | Status: DC | PRN
  Administered 2014-12-26 (×2): 0.5 mg via INTRAVENOUS
  Administered 2014-12-26: 1 mg via INTRAVENOUS
  Administered 2014-12-26 (×2): 0.5 mg via INTRAVENOUS

## 2014-12-26 MED ORDER — BUPIVACAINE-EPINEPHRINE 0.5% -1:200000 IJ SOLN
INTRAMUSCULAR | Status: DC | PRN
Start: 2014-12-26 — End: 2014-12-26
  Administered 2014-12-26: 20 mL

## 2014-12-26 MED ORDER — ACETAMINOPHEN 10 MG/ML IV SOLN
INTRAVENOUS | Status: DC | PRN
  Administered 2014-12-26: 1000 mg via INTRAVENOUS

## 2014-12-26 MED ORDER — SODIUM CHLORIDE 0.9 % IJ SOLN
INTRAMUSCULAR | Status: DC | PRN
  Administered 2014-12-26: 20 mL

## 2014-12-26 MED ORDER — HYDROMORPHONE HCL 1 MG/ML IJ SOLN
0.2500 mg | INTRAMUSCULAR | Status: DC | PRN
  Filled 2014-12-26: qty 1

## 2014-12-26 MED ORDER — SODIUM CHLORIDE 0.9 % IR SOLN
Status: DC | PRN
  Administered 2014-12-26: 500 mL

## 2014-12-26 MED ORDER — ONDANSETRON HCL 4 MG/2ML IJ SOLN
INTRAMUSCULAR | Status: DC | PRN
  Administered 2014-12-26: 4 mg via INTRAVENOUS

## 2014-12-26 MED ORDER — MEPERIDINE HCL 25 MG/ML IJ SOLN
6.2500 mg | INTRAMUSCULAR | Status: DC | PRN
  Filled 2014-12-26: qty 1

## 2014-12-26 SURGICAL SUPPLY — 53 items
APL SKNCLS STERI-STRIP NONHPOA (GAUZE/BANDAGES/DRESSINGS) ×1
BENZOIN TINCTURE PRP APPL 2/3 (GAUZE/BANDAGES/DRESSINGS) ×3 IMPLANT
BLADE HEX COATED 2.75 (ELECTRODE) ×2 IMPLANT
BLADE SURG 15 STRL LF DISP TIS (BLADE) ×1 IMPLANT
BLADE SURG 15 STRL SS (BLADE) ×2
BRIEF STRETCH FOR OB PAD LRG (UNDERPADS AND DIAPERS) ×3 IMPLANT
CANISTER SUCTION 2500CC (MISCELLANEOUS) ×2 IMPLANT
COVER MAYO STAND STRL (DRAPES) ×2 IMPLANT
COVER TABLE BACK 60X90 (DRAPES) ×2 IMPLANT
DRAIN PENROSE 18X1/2 LTX STRL (DRAIN) IMPLANT
DRAPE LG THREE QUARTER DISP (DRAPES) ×1 IMPLANT
DRAPE PED LAPAROTOMY (DRAPES) ×2 IMPLANT
DRAPE UTILITY XL STRL (DRAPES) ×2 IMPLANT
DRSG PAD ABDOMINAL 8X10 ST (GAUZE/BANDAGES/DRESSINGS) ×1 IMPLANT
ELECT REM PT RETURN 9FT ADLT (ELECTROSURGICAL) ×2
ELECTRODE REM PT RTRN 9FT ADLT (ELECTROSURGICAL) ×1 IMPLANT
GAUZE SPONGE 4X4 16PLY XRAY LF (GAUZE/BANDAGES/DRESSINGS) ×2 IMPLANT
GLOVE BIO SURGEON STRL SZ 6.5 (GLOVE) ×4 IMPLANT
GLOVE BIOGEL PI IND STRL 6.5 (GLOVE) IMPLANT
GLOVE BIOGEL PI IND STRL 7.0 (GLOVE) ×1 IMPLANT
GLOVE BIOGEL PI INDICATOR 6.5 (GLOVE) ×2
GLOVE BIOGEL PI INDICATOR 7.0 (GLOVE) ×1
GLOVE INDICATOR 7.0 STRL GRN (GLOVE) ×3 IMPLANT
GOWN STRL REUS W/ TWL LRG LVL3 (GOWN DISPOSABLE) IMPLANT
GOWN STRL REUS W/TWL 2XL LVL3 (GOWN DISPOSABLE) ×3 IMPLANT
GOWN STRL REUS W/TWL LRG LVL3 (GOWN DISPOSABLE) ×2
HYDROGEN PEROXIDE 16OZ (MISCELLANEOUS) ×1 IMPLANT
NDL HYPO 25X1 1.5 SAFETY (NEEDLE) ×1 IMPLANT
NDL SAFETY ECLIPSE 18X1.5 (NEEDLE) ×1 IMPLANT
NEEDLE HYPO 18GX1.5 SHARP (NEEDLE)
NEEDLE HYPO 25X1 1.5 SAFETY (NEEDLE) ×2 IMPLANT
NS IRRIG 500ML POUR BTL (IV SOLUTION) ×2 IMPLANT
PACK BASIN DAY SURGERY FS (CUSTOM PROCEDURE TRAY) ×2 IMPLANT
PAD ABD 8X10 STRL (GAUZE/BANDAGES/DRESSINGS) ×2 IMPLANT
PENCIL BUTTON HOLSTER BLD 10FT (ELECTRODE) ×2 IMPLANT
SPONGE GAUZE 4X4 12PLY (GAUZE/BANDAGES/DRESSINGS) IMPLANT
SPONGE GAUZE 4X4 12PLY STER LF (GAUZE/BANDAGES/DRESSINGS) ×2 IMPLANT
SPONGE SURGIFOAM ABS GEL 100 (HEMOSTASIS) ×1 IMPLANT
SPONGE SURGIFOAM ABS GEL 12-7 (HEMOSTASIS) IMPLANT
SUT CHROMIC 2 0 SH (SUTURE) ×1 IMPLANT
SUT CHROMIC 3 0 SH 27 (SUTURE) IMPLANT
SUT SILK 2 0 (SUTURE)
SUT SILK 2-0 18XBRD TIE 12 (SUTURE) IMPLANT
SUT VIC AB 3-0 SH 18 (SUTURE) IMPLANT
SUT VIC AB 4-0 P-3 18XBRD (SUTURE) IMPLANT
SUT VIC AB 4-0 P3 18 (SUTURE)
SYR CONTROL 10ML LL (SYRINGE) ×2 IMPLANT
TOWEL OR 17X24 6PK STRL BLUE (TOWEL DISPOSABLE) ×3 IMPLANT
TRAY DSU PREP LF (CUSTOM PROCEDURE TRAY) ×2 IMPLANT
TUBE CONNECTING 12X1/4 (SUCTIONS) ×2 IMPLANT
UNDERPAD 30X30 INCONTINENT (UNDERPADS AND DIAPERS) ×2 IMPLANT
WATER STERILE IRR 500ML POUR (IV SOLUTION) ×2 IMPLANT
YANKAUER SUCT BULB TIP NO VENT (SUCTIONS) ×2 IMPLANT

## 2014-12-26 NOTE — Anesthesia Preprocedure Evaluation (Signed)
Anesthesia Evaluation  Patient identified by MRN, date of birth, ID band Patient awake    Reviewed: Allergy & Precautions, H&P , NPO status , Patient's Chart, lab work & pertinent test results  Airway Mallampati: I  TM Distance: >3 FB Neck ROM: Full    Dental  (+) Teeth Intact, Dental Advisory Given   Pulmonary neg pulmonary ROS,  breath sounds clear to auscultation        Cardiovascular negative cardio ROS  Rhythm:Regular     Neuro/Psych  Headaches, negative neurological ROS  negative psych ROS   GI/Hepatic Neg liver ROS, GERD-  ,  Endo/Other  negative endocrine ROSHypothyroidism   Renal/GU negative Renal ROS     Musculoskeletal  (+) Arthritis -,   Abdominal   Peds  Hematology negative hematology ROS (+)   Anesthesia Other Findings   Reproductive/Obstetrics negative OB ROS                             Anesthesia Physical  Anesthesia Plan  ASA: II  Anesthesia Plan: MAC   Post-op Pain Management:    Induction: Intravenous  Airway Management Planned:   Additional Equipment:   Intra-op Plan:   Post-operative Plan:   Informed Consent: I have reviewed the patients History and Physical, chart, labs and discussed the procedure including the risks, benefits and alternatives for the proposed anesthesia with the patient or authorized representative who has indicated his/her understanding and acceptance.   Dental advisory given  Plan Discussed with: CRNA  Anesthesia Plan Comments:         Anesthesia Quick Evaluation

## 2014-12-26 NOTE — Op Note (Signed)
12/26/2014  1:28 PM  PATIENT:  Shelly Wilkinson  43 y.o. female  Patient Care Team: Kelton Pillar, MD as PCP - General (Family Medicine)  PRE-OPERATIVE DIAGNOSIS:  Chronic anal fissure  POST-OPERATIVE DIAGNOSIS:  Chronic anal fissure  PROCEDURE:   EXAM UNDER ANESTHESIA  CHEMICAL SPHINCTEROTOMY (BOTOX)  SURGEON:  Surgeon(s): Leighton Ruff, MD  ASSISTANT: none   ANESTHESIA:   local and IV sedation  SPECIMEN:  No Specimen  DISPOSITION OF SPECIMEN:  N/A  COUNTS:  YES  PLAN OF CARE: Discharge to home after PACU  PATIENT DISPOSITION:  PACU - hemodynamically stable.  INDICATION: 43 y.o. F with a h/o anorectal fistula who now has developed a non-healing fissure   OR FINDINGS: moderate sized posterior anal fissure  DESCRIPTION: the patient was identified in the preoperative holding area and taken to the OR where they were laid on the operating room table.  MAC anesthesia was induced without difficulty. The patient was then positioned in prone jackknife position with buttocks gently taped apart.  The patient was then prepped and draped in usual sterile fashion.  SCDs were noted to be in place prior to the initiation of anesthesia. A surgical timeout was performed indicating the correct patient, procedure, positioning and need for preoperative antibiotics.  I placed a rectal block using Marcaine with epinephrine.   I began with a digital rectal exam.  There were no abnormal masses.  I then placed a Hill-Ferguson anoscope into the anal canal and evaluated this completely.  The anal canal and distal rectum were slightly inflamed.  The fissure was present at posterior midline and there were sphincter fibers visualized.  I cleaned the edges of the fissure using sharp dissection.  Cautery was used for hemostasis.  I then injected 15u of hydrated Botox into the intersphincteric groove in all 4 quadrants for a total of 60u of Botox.  At the end of the procedure the sphincter hypertension was  gone.

## 2014-12-26 NOTE — Interval H&P Note (Signed)
History and Physical Interval Note:  12/26/2014 12:46 PM  Shelly Wilkinson  has presented today for surgery, with the diagnosis of anal fissure  The various methods of treatment have been discussed with the patient and family. After consideration of risks, benefits and other options for treatment, the patient has consented to  Procedure(s): EXAM UNDER ANESTHESIA WITH ANAL FISTULECTOMY (N/A) CHEMICAL SPHINCTEROTOMY (BOTOX) (N/A) as a surgical intervention .  The patient's history has been reviewed, patient examined, no change in status, stable for surgery.  I have reviewed the patient's chart and labs.  Questions were answered to the patient's satisfaction.  Risks include temporary incontinence.    Rosario Adie, MD  [facility]

## 2014-12-26 NOTE — H&P (View-Only) (Signed)
Shelly Wilkinson 12/01/2014 2:14 PM Location: [facility] Patient #: [id number] DOB: 1971/10/21 Married / Language: [name] / Race: White Female History of Present Illness [name]; 08/31/8181 3:14 PM) Patient words: anal fissure.  The patient is a 43 year old female who presents with an anal fissure. She has been using diltiazem ointment for approximately 8 weeks. She has noticed a slight change in her symptoms. She is having less pain with bowel movements. She denies any bleeding. Other Problems Leighton Ruff, MD; 99/02/7168 3:14 PM) Hemorrhoids Hypercholesterolemia Migraine Headache Thyroid Disease ANAL FISSURE (565.0  K60.2) Patient is 6 months status post [facility] for bleeding internal hemorrhoids. She presents to the office with anal pain with bowel movement. She denies any bleeding. She has an anal fissure. It did not resolve with 8 weeks of diltiazem cream.  Past Surgical History Leighton Ruff, MD; 67/07/9380 3:14 PM) Foot Surgery Bilateral. Hemorrhoidectomy  Allergies (Sonya Bynum, CMA; 12/01/2014 2:16 PM) Sulfa Drugs  Medication History (Sonya Bynum, CMA; 12/01/2014 2:16 PM) DILTIAZEM Gel (2% Gel, see note application External three times daily, Taken starting 10/25/2014) Active. MiraLax (Oral) Active. Aczone (5% Gel, External) Active. Botox (100UNIT For Solution, Injection) Active. Vitamin D (400UNIT Capsule, 1 (one) Oral) Active. Migranal (4MG/ML Solution, Nasal) Active. Gabapentin (300MG Capsule, Oral) Active. Anusol-HC (25MG Suppository, Rectal) Active. Reglan (10MG Tablet, Oral) Active. Elmiron (100MG Capsule, Oral) Active. Spironolactone (100MG Tablet, Oral) Active. Tazarotene (0.05% Cream, External) Active. Synthroid (75MCG Tablet, Oral) Active.  Social History Leighton Ruff, MD; 01/04/5101 3:15 PM) Alcohol use Occasional alcohol use. Caffeine use Coffee. No drug use Tobacco use Never smoker.  Family History Leighton Ruff, MD; 58/04/2777 3:15 PM) Arthritis Father, Mother. Colon Polyps Father, Mother. Diabetes Mellitus Father. Heart Disease Father. Heart disease in female family member before age 8 Hypertension Father. Ischemic Bowel Disease Father. Migraine Headache Mother. Thyroid problems Father.     Review of Systems [name]; 24/01/3535 3:16 PM) General Not Present- Appetite Loss, Chills and Fever. Respiratory Not Present- Cough and Difficulty Breathing. Cardiovascular Not Present- Chest Pain and Edema. Gastrointestinal Not Present- Abdominal Pain, Abdominal Swelling and Change in Bowel Habits. Musculoskeletal Not Present- Joint Pain, Joint Redness and Joint Stiffness. Neurological Not Present- Dizziness and Fainting. Hematology Not Present- Abnormal Bleeding and Anemia.  Vitals ([name]; 12/01/2014 2:16 PM) 12/01/2014 2:14 PM Weight: 121 lb Height: 62in Body Surface Area: 1.55 m Body Mass Index: 22.13 kg/m Temp.: 97.98F(Temporal)  Pulse: 72 (Regular)  BP: 120/80 (Sitting, Left Arm, Standard)     Physical Exam [name]; 14/03/3153 3:16 PM)  General Mental Status-Alert. General Appearance-Consistent with stated age. Hydration-Well hydrated. Voice-Normal.  Head and Neck Head-normocephalic, atraumatic with no lesions or palpable masses. Trachea-midline. Thyroid Gland Characteristics - normal size and consistency.  Eye Eyeball - Bilateral-Extraocular movements intact. Sclera/Conjunctiva - Bilateral-No scleral icterus.  Chest and Lung Exam Chest and lung exam reveals -quiet, even and easy respiratory effort with no use of accessory muscles and on auscultation, normal breath sounds, no adventitious sounds and normal vocal resonance. Inspection Chest Wall - Normal. Back - normal.  Breast Breast - Left-Symmetric, Non Tender, No Biopsy scars, no Dimpling, No Inflammation, No Lumpectomy scars, No Mastectomy scars,  No Peau d' Orange. Breast - Right-Symmetric, Non Tender, No Biopsy scars, no Dimpling, No Inflammation, No Lumpectomy scars, No Mastectomy scars, No Peau d' Orange. Breast Lump-No Palpable Breast Mass.  Cardiovascular Cardiovascular examination reveals -normal heart sounds, regular rate and rhythm with no murmurs and normal pedal  pulses bilaterally.  Abdomen Inspection Inspection of the abdomen reveals - No Hernias. Skin - Scar - no surgical scars. Palpation/Percussion Palpation and Percussion of the abdomen reveal - Soft, Non Tender, No Rebound tenderness, No Rigidity (guarding) and No hepatosplenomegaly. Auscultation Auscultation of the abdomen reveals - Bowel sounds normal.  Rectal Anorectal Exam External - Note: Fissure present. Slight healing has occurred.  Neurologic Neurologic evaluation reveals -alert and oriented x 3 with no impairment of recent or remote memory. Mental Status-Normal.  Musculoskeletal Normal Exam - Left-Upper Extremity Strength Normal and Lower Extremity Strength Normal. Normal Exam - Right-Upper Extremity Strength Normal and Lower Extremity Strength Normal.    Assessment & Plan [name]; 80/01/2178 3:17 PM)  ANAL FISSURE (565.0  K60.2) Story: Patient is 6 months status post [facility] for bleeding internal hemorrhoids. She presents to the office with anal pain with bowel movement. She denies any bleeding. She has an anal fissure. It did not resolve with 8 weeks of diltiazem cream. Impression: On exam there was only slight healing noted. I have recommended exam under anesthesia with chemicals be dry to me. Risks include bleeding, pain and recurrence. I believe she understands these risk and has agreed to proceed with surgery.

## 2014-12-26 NOTE — Discharge Instructions (Addendum)
ANORECTAL SURGERY: POST OP INSTRUCTIONS 1. Take your usually prescribed home medications unless otherwise directed. 2. DIET: During the first few hours after surgery sip on some liquids until you are able to urinate.  It is normal to not urinate for several hours after this surgery.  If you feel uncomfortable, please contact the office for instructions.  After you are able to urinate,you may eat, if you feel like it.  Follow a light bland diet the first 24 hours after arrival home, such as soup, liquids, crackers, etc.  Be sure to include lots of fluids daily (6-8 glasses).  Avoid fast food or heavy meals, as your are more likely to get nauseated.  Eat a low fat diet the next few days after surgery.  Limit caffeine intake to 1-2 servings a day. 3. PAIN CONTROL: a. Pain is best controlled by a usual combination of several different methods TOGETHER: i. Muscle relaxation 1.  Soak in a warm bath (or Sitz bath) three times a day and after bowel movements.  Continue to do this until all pain is resolved. 2. Take the muscle relaxer (Valium) every 6 hours as need for urinary difficulty for the first 2 days after surgery  ii. Over the counter pain medication iii. Prescription pain medication b. Most patients will experience some swelling and discomfort in the anus/rectal area and incisions.  Heat such as warm towels, sitz baths, warm baths, etc to help relax tight/sore spots and speed recovery.  Some people prefer to use ice, especially in the first couple days after surgery, as it may decrease the pain and swelling, or alternate between ice & heat.  Experiment to what works for you.  Swelling and bruising can take several weeks to resolve.  Pain can take even longer to completely resolve. c. It is helpful to take an over-the-counter pain medication regularly for the first few weeks.  Choose one of the following that works best for you: i. Naproxen (Aleve, etc)  Two 258m tabs twice a day ii. Ibuprofen (Advil,  etc) Three 2043mtabs four times a day (every meal & bedtime) d. A  prescription for pain medication (such as percocet, oxycodone, hydrocodone, etc) should be given to you upon discharge.  Take your pain medication as prescribed.  i. If you are having problems/concerns with the prescription medicine (does not control pain, nausea, vomiting, rash, itching, etc), please call [phone] see if we need to switch you to a different pain medicine that will work better for you and/or control your side effect better. ii. If you need a refill on your pain medication, please contact your pharmacy.  They will contact our office to request authorization. Prescriptions will not be filled after 5 pm or on week-ends. 4. KEEP YOUR BOWELS REGULAR and AVOID CONSTIPATION a. The goal is one to two soft bowel movements a day.  You should at least have a bowel movement every other day. b. Avoid getting constipated.  Between the surgery and the pain medications, it is common to experience some constipation. This can be very painful after rectal surgery.  Increasing fluid intake and taking a fiber supplement (such as Metamucil, Citrucel, FiberCon, etc) 1-2 times a day regularly will usually help prevent this problem from occurring.  A stool softener like colace is also recommended.  This can be purchased over the counter at your pharmacy.  You can take it up to 3 times a day.  If you do not have a bowel movement after  24 hrs since your surgery, take one does of milk of magnesia.  If you still haven't had a bowel movement 8-12 hours after that dose, take another dose.  If you don't have a bowel movement 48 hrs after surgery, purchase a Fleets enema from the drug store and administer gently per package instructions.  If you still are having trouble with your bowel movements after that, please call the office for further instructions. c. If you develop diarrhea or have many loose bowel movements, simplify your diet to bland  foods & liquids for a few days.  Stop any stool softeners and decrease your fiber supplement.  Switching to mild anti-diarrheal medications (Kayopectate, Pepto Bismol) can help.  If this worsens or does not improve, please call us.  5. Wound Care a. Remove your bandages before your first bowel movement or 8 hours after surgery.     b. Remove any wound packing material at this tim,e as well.  You do not need to repack the wound unless instructed otherwise.  Wear an absorbent pad or soft cotton gauze in your underwear to catch any drainage and help keep the area clean. You should change this every 2-3 hours while awake. c. Keep the area clean and dry.  Bathe / shower every day, especially after bowel movements.  Keep the area clean by showering / bathing over the incision / wound.   It is okay to soak an open wound to help wash it.  Wet wipes or showers / gentle washing after bowel movements is often less traumatic than regular toilet paper. d. Dennis Bast may have some styrofoam-like soft packing in the rectum which will come out with the first bowel movement.  e. You will often notice bleeding with bowel movements.  This should slow down by the end of the first week of surgery f. Expect some drainage.  This should slow down, too, by the end of the first week of surgery.  Wear an absorbent pad or soft cotton gauze in your underwear until the drainage stops. g. Do Not sit on a rubber or pillow ring.  This can make you symptoms worse.  You may sit on a soft pillow if needed.  6. ACTIVITIES as tolerated:   a. You may resume regular (light) daily activities beginning the next day--such as daily self-care, walking, climbing stairs--gradually increasing activities as tolerated.  If you can walk 30 minutes without difficulty, it is safe to try more intense activity such as jogging, treadmill, bicycling, low-impact aerobics, swimming, etc. b. Save the most intensive and strenuous activity for last such as sit-ups, heavy  lifting, contact sports, etc  Refrain from any heavy lifting or straining until you are off narcotics for pain control.   c. You may drive when you are no longer taking prescription pain medication, you can comfortably sit for long periods of time, and you can safely maneuver your car and apply brakes. d. Dennis Bast may have sexual intercourse when it is comfortable.  7. FOLLOW UP in our office a. Please call [facility] at [phone] to set up an appointment to see your surgeon in the office for a follow-up appointment approximately 3-4 weeks after your surgery. b. Make sure that you call for this appointment the day you arrive home to insure a convenient appointment time. 10. IF YOU HAVE DISABILITY OR FAMILY LEAVE FORMS, BRING THEM TO THE OFFICE FOR PROCESSING.  DO NOT GIVE THEM TO YOUR DOCTOR.     WHEN TO CALL us [phone]  [phone]: 1. Poor pain control 2. Reactions / problems with new medications (rash/itching, nausea, etc)  3. Fever over 101.5 F (38.5 C) 4. Inability to urinate 5. Nausea and/or vomiting 6. Worsening swelling or bruising 7. Continued bleeding from incision. 8. Increased pain, redness, or drainage from the incision  The clinic staff is available to answer your questions during regular business hours (8:30am-5pm).  Please dont hesitate to call and ask to speak to one of our nurses for clinical concerns.   A surgeon from [facility] is always on call at the hospitals   If you have a medical emergency, go to the nearest emergency room or call 911.    [facility], [address], [place], [place],   [address] ? MAIN: [phone] ? TOLL FREE: [phone] ? FAX [phone] [email or web address]      Post Anesthesia Home Care Instructions  Activity: Get plenty of rest for the remainder of the day. A responsible adult should stay with you for 24 hours following the procedure.  For the next 24 hours, DO NOT: -Drive a  car -Paediatric nurse -Drink alcoholic beverages -Take any medication unless instructed by your physician -Make any legal decisions or sign important papers.  Meals: Start with liquid foods such as gelatin or soup. Progress to regular foods as tolerated. Avoid greasy, spicy, heavy foods. If nausea and/or vomiting occur, drink only clear liquids until the nausea and/or vomiting subsides. Call your physician if vomiting continues.  Special Instructions/Symptoms: Your throat may feel dry or sore from the anesthesia or the breathing tube placed in your throat during surgery. If this causes discomfort, gargle with warm salt water. The discomfort should disappear within 24 hours.

## 2014-12-26 NOTE — Transfer of Care (Signed)
Immediate Anesthesia Transfer of Care Note  Patient: Shelly Wilkinson  Procedure(s) Performed: Procedure(s) (LRB): EXAM UNDER ANESTHESIA  (N/A) CHEMICAL SPHINCTEROTOMY (BOTOX) (N/A)  Patient Location: PACU  Anesthesia Type: MAC with local Level of Consciousness: awake, sedated, patient cooperative and responds to stimulation  Airway & Oxygen Therapy: Patient Spontanous Breathing and Patient connected to face mask oxygen  Post-op Assessment: Report given to PACU RN, Post -op Vital signs reviewed and stable and Patient moving all extremities  Post vital signs: Reviewed and stable  Complications: No apparent anesthesia complications

## 2014-12-27 ENCOUNTER — Encounter (HOSPITAL_BASED_OUTPATIENT_CLINIC_OR_DEPARTMENT_OTHER): Payer: Self-pay | Admitting: General Surgery

## 2014-12-31 NOTE — Anesthesia Postprocedure Evaluation (Signed)
Anesthesia Post Note  Patient: Shelly Wilkinson  Procedure(s) Performed: Procedure(s) (LRB): EXAM UNDER ANESTHESIA  (N/A) CHEMICAL SPHINCTEROTOMY (BOTOX) (N/A)  Anesthesia type: MAC  Patient location: PACU  Post pain: Pain level controlled  Post assessment: Post-op Vital signs reviewed  Last Vitals: BP 95/62 mmHg  Pulse 58  Temp(Src) 36.2 C (Oral)  Resp 14  Ht 5' 2"  (1.575 m)  Wt 120 lb (54.432 kg)  BMI 21.94 kg/m2  SpO2 100%  Post vital signs: Reviewed  Level of consciousness: awake  Complications: No apparent anesthesia complications

## 2015-05-22 ENCOUNTER — Ambulatory Visit: Payer: Self-pay | Admitting: Obstetrics & Gynecology

## 2015-06-04 ENCOUNTER — Ambulatory Visit: Payer: Self-pay | Admitting: Obstetrics & Gynecology

## 2015-06-07 ENCOUNTER — Encounter: Payer: Self-pay | Admitting: Obstetrics & Gynecology

## 2015-06-07 ENCOUNTER — Ambulatory Visit (INDEPENDENT_AMBULATORY_CARE_PROVIDER_SITE_OTHER): Payer: BLUE CROSS/BLUE SHIELD | Admitting: Obstetrics & Gynecology

## 2015-06-07 VITALS — BP 90/55 | HR 78 | Resp 16 | Ht 62.0 in | Wt 116.0 lb

## 2015-06-07 DIAGNOSIS — N898 Other specified noninflammatory disorders of vagina: Secondary | ICD-10-CM

## 2015-06-07 DIAGNOSIS — Z0000 Encounter for general adult medical examination without abnormal findings: Secondary | ICD-10-CM

## 2015-06-07 DIAGNOSIS — Z1151 Encounter for screening for human papillomavirus (HPV): Secondary | ICD-10-CM

## 2015-06-07 DIAGNOSIS — R229 Localized swelling, mass and lump, unspecified: Secondary | ICD-10-CM | POA: Diagnosis not present

## 2015-06-07 DIAGNOSIS — Z01419 Encounter for gynecological examination (general) (routine) without abnormal findings: Secondary | ICD-10-CM | POA: Diagnosis not present

## 2015-06-07 DIAGNOSIS — Z124 Encounter for screening for malignant neoplasm of cervix: Secondary | ICD-10-CM

## 2015-06-07 NOTE — Progress Notes (Signed)
  Subjective:     Shelly Wilkinson is a 44 y.o. female here for a routine exam.  Current complaints: vaginal itching and discharge.  Pt also c/o mental fog nad problems with memory.  Pt has chonic migraines and on preventatives which can lead to this fog.  Patient considering a neuro psych evaluation.     Gynecologic History No LMP recorded. Patient has had an ablation. Contraception: vasectomy Last Pap: 2013. Results were: normal Last mammogram: 2015. Results were: normal  Obstetric History OB History  Gravida Para Term Preterm AB SAB TAB Ectopic Multiple Living  3 2 2  1 1    2     # Outcome Date GA Lbr Len/2nd Weight Sex Delivery Anes PTL Lv  3 SAB           2 Term           1 Term                The following portions of the patient's history were reviewed and updated as appropriate: allergies, current medications, past family history, past medical history, past social history, past surgical history and problem list.  Review of Systems Pertinent items are noted in HPI.    Objective:      Filed Vitals:   06/07/15 1441  BP: 90/55  Pulse: 78  Resp: 16  Height: 5' 2"  (1.575 m)  Weight: 116 lb (52.617 kg)   Vitals:  WNL General appearance: alert, cooperative and no distress Head: Normocephalic, without obvious abnormality, atraumatic Eyes: negative Throat: lips, mucosa, and tongue normal; teeth and gums normal Lungs: clear to auscultation bilaterally Breasts: normal appearance, no masses or tenderness, No nipple retraction or dimpling, No nipple discharge or bleeding Heart: regular rate and rhythm Abdomen: soft, non-tender; bowel sounds normal; no masses,  no organomegaly  Pelvic:  External Genitalia:  Tanner V, no lesion Urethra:  No prolapse Vagina:  Pale pink, normal rugae, no blood, thin white discharge, 2 cm flesh colred mass at top pf vagina on right. Cervix:  No CMT, no lesion Uterus:  Normal size and contour, non tender Adnexa:  Normal, no masses, non  tender  Extremities: no edema, redness or tenderness in the calves or thighs Skin: no lesions or rash Lymph nodes: Axillary adenopathy: none        Assessment:    Healthy female exam.   Vaginal mass Vaginal discharge Mental fog   Plan:    Education reviewed: self breast exams and skin cancer screening. Follow up in: 2 weeks. vaginal biopsy  Bd affirm for discharge Consider neuro psych exam.

## 2015-06-09 ENCOUNTER — Other Ambulatory Visit: Payer: Self-pay | Admitting: Obstetrics & Gynecology

## 2015-06-09 LAB — WET PREP BY MOLECULAR PROBE
Candida species: NEGATIVE
Gardnerella vaginalis: POSITIVE — AB
Trichomonas vaginosis: NEGATIVE

## 2015-06-09 MED ORDER — METRONIDAZOLE 0.75 % VA GEL: 1.0000 | Freq: Every day | VAGINAL | Status: DC

## 2015-06-09 NOTE — Progress Notes (Signed)
BV on wet prep.  Metrogel prescribed.  Patient still needs [place] drawn.

## 2015-06-11 LAB — CYTOLOGY - PAP

## 2015-06-19 ENCOUNTER — Encounter: Payer: Self-pay | Admitting: Obstetrics & Gynecology

## 2015-06-19 ENCOUNTER — Ambulatory Visit (INDEPENDENT_AMBULATORY_CARE_PROVIDER_SITE_OTHER): Payer: BLUE CROSS/BLUE SHIELD | Admitting: Obstetrics & Gynecology

## 2015-06-19 VITALS — BP 100/62 | HR 67 | Resp 16 | Ht 62.0 in | Wt 115.0 lb

## 2015-06-19 DIAGNOSIS — N951 Menopausal and female climacteric states: Secondary | ICD-10-CM

## 2015-06-19 DIAGNOSIS — R1909 Other intra-abdominal and pelvic swelling, mass and lump: Secondary | ICD-10-CM

## 2015-06-19 DIAGNOSIS — N9489 Other specified conditions associated with female genital organs and menstrual cycle: Secondary | ICD-10-CM | POA: Diagnosis not present

## 2015-06-19 DIAGNOSIS — N898 Other specified noninflammatory disorders of vagina: Secondary | ICD-10-CM

## 2015-06-19 MED ORDER — ALPRAZOLAM 0.5 MG PO TABS: ORAL_TABLET | ORAL | Status: DC

## 2015-06-19 MED ORDER — FLUCONAZOLE 150 MG PO TABS: 150.0000 mg | ORAL_TABLET | Freq: Once | ORAL | Status: DC

## 2015-06-19 NOTE — Progress Notes (Signed)
   Subjective:    Patient ID: Shelly Wilkinson, female    DOB: [birth year], 44 y.o.   MRN: [id number]  HPI  Patient presents for vaginal biopsy. Patient does not feel pain.  Patient denies bleeding.  Vaginal discharge much improved after metronidazole.   Patient did not have Xanax to take--will prescribe some so she has a tablet on hand before next procedure.  Review of Systems  Constitutional: Negative.   Respiratory: Negative.   Cardiovascular: Negative.   Gastrointestinal: Positive for diarrhea.  Genitourinary: Positive for urgency.  Psychiatric/Behavioral: Negative.        Objective:   Physical Exam  Constitutional: She is oriented to person, place, and time. She appears well-developed and well-nourished.  HENT:  Head: Normocephalic.  Eyes: Conjunctivae are normal.  Pulmonary/Chest: Effort normal.  Abdominal: Soft.  Genitourinary:    Musculoskeletal: She exhibits no edema.  Neurological: She is alert and oriented to person, place, and time.  Skin: Skin is warm and dry.  Psychiatric: She has a normal mood and affect.  Vitals reviewed.         Assessment & Plan:  44 yo female with vaginal mass approx 7-8 o'clock near junction of cervix.  1-Biopsy x2 of area without complication 2-Thick vaginal dischage--diflucan (patinet going on cruise Sunday) 3-FSH 4-Xanax prescription (10 pills) given.

## 2015-06-20 LAB — FOLLICLE STIMULATING HORMONE: FSH: 8.9 m[IU]/mL

## 2015-07-10 ENCOUNTER — Telehealth: Payer: Self-pay | Admitting: *Deleted

## 2015-07-10 DIAGNOSIS — B373 Candidiasis of vulva and vagina: Secondary | ICD-10-CM

## 2015-07-10 DIAGNOSIS — N760 Acute vaginitis: Principal | ICD-10-CM

## 2015-07-10 DIAGNOSIS — B3731 Acute candidiasis of vulva and vagina: Secondary | ICD-10-CM

## 2015-07-10 DIAGNOSIS — B9689 Other specified bacterial agents as the cause of diseases classified elsewhere: Secondary | ICD-10-CM

## 2015-07-10 MED ORDER — METRONIDAZOLE 500 MG PO TABS: 500.0000 mg | ORAL_TABLET | Freq: Two times a day (BID) | ORAL | Status: DC

## 2015-07-10 MED ORDER — FLUCONAZOLE 150 MG PO TABS: ORAL_TABLET | ORAL | Status: DC

## 2015-07-10 NOTE — Telephone Encounter (Signed)
Pt called stating that she had BV and yeast infection.  Per Dr Gala Romney may send in RX for Flagyl and Diflucan to [facility]

## 2015-08-14 ENCOUNTER — Encounter: Payer: Self-pay | Admitting: Obstetrics & Gynecology

## 2015-08-14 ENCOUNTER — Ambulatory Visit (INDEPENDENT_AMBULATORY_CARE_PROVIDER_SITE_OTHER): Payer: BLUE CROSS/BLUE SHIELD | Admitting: Obstetrics & Gynecology

## 2015-08-14 VITALS — BP 99/69 | HR 90 | Resp 16 | Ht 62.0 in | Wt 115.0 lb

## 2015-08-14 DIAGNOSIS — B9689 Other specified bacterial agents as the cause of diseases classified elsewhere: Secondary | ICD-10-CM

## 2015-08-14 DIAGNOSIS — N760 Acute vaginitis: Secondary | ICD-10-CM

## 2015-08-14 DIAGNOSIS — N949 Unspecified condition associated with female genital organs and menstrual cycle: Secondary | ICD-10-CM | POA: Diagnosis not present

## 2015-08-14 DIAGNOSIS — A499 Bacterial infection, unspecified: Secondary | ICD-10-CM

## 2015-08-14 NOTE — Progress Notes (Signed)
   Subjective:    Patient ID: Shelly Wilkinson, female    DOB: 1971/05/23, 44 y.o.   MRN: [id number]  HPI  Patient is a 44 year old G3 para 2012 who presents for vaginal discomfort. Patient has been treated for bacterial vaginosis twice recently. One was diagnosed with BD affirm as having Gardnerella. The second time she felt she had recurrence of symptoms and so we treated her without a test. The patient states she is return to normal in between episodes. The pain mostly occurs during sex mucosal little sandpapery and has discomfort and also will have pain post-intercourse. She is able to reach orgasm. Is very new and different for her he does not have pain with tight underwear or jeans or other activities.  Review of Systems  Constitutional: Negative.   Respiratory: Negative.   Gastrointestinal:       IBS symptoms unchanged.  Genitourinary: Positive for vaginal discharge and vaginal pain.       IC symptoms continue but not different  Musculoskeletal: Negative.   Skin: Negative.   Neurological: Positive for headaches.  Psychiatric/Behavioral: Negative.        Objective:   Physical Exam  Constitutional: She is oriented to person, place, and time. She appears well-developed and well-nourished. No distress.  HENT:  Head: Normocephalic and atraumatic.  Eyes: Conjunctivae are normal.  Pulmonary/Chest: Effort normal.  Abdominal: Soft. Bowel sounds are normal. She exhibits no distension and no mass. There is no tenderness. There is no rebound and no guarding.  Genitourinary:  Tanner 5 Vulva normal Vagina pink with thin discharge Cervix:  No lesion  Wet Q-tip patient is tender at the vestibular glands.  Musculoskeletal: She exhibits no edema.  Neurological: She is alert and oriented to person, place, and time.  Skin: Skin is warm and dry.  Psychiatric: She has a normal mood and affect.  Vitals reviewed.         Assessment & Plan:  44 yo female with probable recurrent bacterial  vaginosis  BD affirm done Patient will need test of cure the week after she finishes treatment

## 2015-08-15 LAB — WET PREP BY MOLECULAR PROBE
Candida species: NEGATIVE
Gardnerella vaginalis: NEGATIVE
Trichomonas vaginosis: NEGATIVE

## 2015-08-18 ENCOUNTER — Other Ambulatory Visit: Payer: Self-pay | Admitting: Obstetrics & Gynecology

## 2015-08-18 MED ORDER — LIDOCAINE 5 % EX OINT: 1.0000 "application " | TOPICAL_OINTMENT | CUTANEOUS | Status: DC | PRN

## 2015-09-26 ENCOUNTER — Encounter: Payer: Self-pay | Admitting: Internal Medicine

## 2015-12-03 ENCOUNTER — Other Ambulatory Visit: Payer: Self-pay

## 2015-12-03 DIAGNOSIS — Z1231 Encounter for screening mammogram for malignant neoplasm of breast: Secondary | ICD-10-CM

## 2016-01-03 ENCOUNTER — Ambulatory Visit
Admission: RE | Admit: 2016-01-03 | Discharge: 2016-01-03 | Disposition: A | Discharge status: Home / Self Care | Payer: BLUE CROSS/BLUE SHIELD | Source: Ambulatory Visit

## 2016-01-03 DIAGNOSIS — Z1231 Encounter for screening mammogram for malignant neoplasm of breast: Secondary | ICD-10-CM

## 2016-02-27 ENCOUNTER — Ambulatory Visit (INDEPENDENT_AMBULATORY_CARE_PROVIDER_SITE_OTHER): Payer: BLUE CROSS/BLUE SHIELD | Admitting: Internal Medicine

## 2016-02-27 ENCOUNTER — Other Ambulatory Visit: Payer: BLUE CROSS/BLUE SHIELD

## 2016-02-27 ENCOUNTER — Encounter: Payer: Self-pay | Admitting: Internal Medicine

## 2016-02-27 VITALS — BP 80/62 | HR 68 | Ht 62.0 in | Wt 115.0 lb

## 2016-02-27 DIAGNOSIS — K648 Other hemorrhoids: Secondary | ICD-10-CM | POA: Diagnosis not present

## 2016-02-27 DIAGNOSIS — K580 Irritable bowel syndrome with diarrhea: Secondary | ICD-10-CM

## 2016-02-27 DIAGNOSIS — K589 Irritable bowel syndrome without diarrhea: Secondary | ICD-10-CM

## 2016-02-27 DIAGNOSIS — R197 Diarrhea, unspecified: Secondary | ICD-10-CM

## 2016-02-27 DIAGNOSIS — K642 Third degree hemorrhoids: Secondary | ICD-10-CM | POA: Insufficient documentation

## 2016-02-27 MED ORDER — RIFAXIMIN 550 MG PO TABS: 550.0000 mg | ORAL_TABLET | Freq: Three times a day (TID) | ORAL | Status: DC

## 2016-02-27 NOTE — Progress Notes (Signed)
   Subjective:    Patient ID: Shelly Wilkinson, female    DOB: [birth year], 45 y.o.   MRN: [id number] Cc: IBS HPI [name] is here for follow-up - has hx hemorrhoids and IBS. Had [facility] procedure for hemorrhoids but hemorrhoids have recurred, with prolapse and manual reduction needed, and she has received a recommendation to redo [place]. She has many days of frequent progressively looser stools and intervening days of 1 or no stools. Recurrent sensation of need to defecate "but its just gas" She has had a colonoscopy and negative random bxs in past and Lotronex used but got constipated. At times she will get a very urgent sense to defecate which can create anxiety. Stools often after eating like that but can occur at other times. Has gas, bloating and borborygmi. Has eliminated dairy and no help. Has tried to ID triggers but cannot  Using Align off and on  Father has nonspecific or indeterminate colitis and is on sulfasalazine.  Medications, allergies, past medical history, past surgical history, family history and social history are reviewed and updated in the EMR.  Wt Readings from Last 3 Encounters:  02/27/16 115 lb (52.164 kg)  08/14/15 115 lb (52.164 kg)  06/19/15 115 lb (52.164 kg)   Review of Systems Interstitial cystitis - mostly controlled.    Objective:   Physical Exam BP 80/62 mmHg  Pulse 68  Ht 5' 2"  (1.575 m)  Wt 115 lb (52.164 kg)  BMI 21.03 kg/m2 Abd soft and nontender BS + Rectal exam w/ June McMurry CMA present  NL anoderm DRE mild stenosis - nontender , no mass, no stool - NL voluntary squeeze and appropriate descent and relaxation w/ simulated defecation, no prolapsed hemorrhoids    Assessment & Plan:  IBS (irritable bowel syndrome) - Plan: rifaximin (XIFAXAN) 550 MG TABS tablet  Prolapsed internal hemorrhoids, grade 3  Diarrhea, unspecified type - Plan: IBD Expanded Panel   Will check an IBD panel to make sure this is not occult IBD but very consistent with IBS (also  has IC). Will treat with Xifaxan to see if bowel habits normalize and less bloating and gas.  Pending this FODMAPS diet can be tried vs looking for IBD if serology test +  Re: hemorrhoids suspect she will need repeat surgery to fix. I suspect some of her sensation to defecate could be from swollen internal hemorrhoids.  Note that she has already tried pelvic floor PT at  [facility]  Cc: Osborne Casco, MD Bjorn Loser, MD and Leighton Ruff, MD

## 2016-02-27 NOTE — Patient Instructions (Addendum)
  Your physician has requested that you go to the basement for the following lab work before leaving today: IBD panel, we will be in touch with results and plans.   We have sent the following medications to your pharmacy for you to pick up at your convenience: xifaxan   I appreciate the opportunity to care for you. Silvano Rusk, MD, [facility]

## 2016-03-04 LAB — IBD EXPANDED PANEL
ACCA: 26 units (ref 0–90)
ALCA: 39 units (ref 0–60)
AMCA: 60 units (ref 0–100)
Atypical pANCA: NEGATIVE
gASCA: 28 units (ref 0–50)

## 2016-03-05 ENCOUNTER — Telehealth: Payer: Self-pay | Admitting: Internal Medicine

## 2016-03-05 NOTE — Progress Notes (Signed)
Quick Note:  The lab tests do NOT show inflammatory bowel disease ______

## 2016-03-05 NOTE — Telephone Encounter (Signed)
Patient advised that Dr. Carlean Purl sent her a message in [place].  All questions answered.  She will call back for additional questions or concerns

## 2016-03-12 ENCOUNTER — Encounter: Payer: Self-pay | Admitting: Internal Medicine

## 2016-03-26 ENCOUNTER — Ambulatory Visit: Payer: BLUE CROSS/BLUE SHIELD | Admitting: Internal Medicine

## 2016-06-11 DIAGNOSIS — G43719 Chronic migraine without aura, intractable, without status migrainosus: Secondary | ICD-10-CM | POA: Diagnosis not present

## 2016-08-20 ENCOUNTER — Encounter: Payer: Self-pay | Admitting: Internal Medicine

## 2016-08-20 ENCOUNTER — Ambulatory Visit: Payer: BLUE CROSS/BLUE SHIELD | Admitting: Internal Medicine

## 2016-08-20 ENCOUNTER — Other Ambulatory Visit: Payer: Self-pay | Admitting: Internal Medicine

## 2016-08-20 ENCOUNTER — Other Ambulatory Visit (INDEPENDENT_AMBULATORY_CARE_PROVIDER_SITE_OTHER): Payer: BLUE CROSS/BLUE SHIELD

## 2016-08-20 ENCOUNTER — Ambulatory Visit (INDEPENDENT_AMBULATORY_CARE_PROVIDER_SITE_OTHER): Payer: BLUE CROSS/BLUE SHIELD | Admitting: Internal Medicine

## 2016-08-20 VITALS — BP 80/50 | HR 72 | Ht 61.8 in | Wt 117.1 lb

## 2016-08-20 DIAGNOSIS — K648 Other hemorrhoids: Secondary | ICD-10-CM | POA: Diagnosis not present

## 2016-08-20 DIAGNOSIS — K589 Irritable bowel syndrome without diarrhea: Secondary | ICD-10-CM | POA: Diagnosis not present

## 2016-08-20 DIAGNOSIS — F419 Anxiety disorder, unspecified: Secondary | ICD-10-CM | POA: Diagnosis not present

## 2016-08-20 DIAGNOSIS — D649 Anemia, unspecified: Secondary | ICD-10-CM

## 2016-08-20 DIAGNOSIS — K642 Third degree hemorrhoids: Secondary | ICD-10-CM

## 2016-08-20 LAB — CBC WITH DIFFERENTIAL/PLATELET
Basophils Absolute: 0 10*3/uL (ref 0.0–0.1)
Basophils Relative: 1.1 % (ref 0.0–3.0)
Eosinophils Absolute: 0.2 10*3/uL (ref 0.0–0.7)
Eosinophils Relative: 4.5 % (ref 0.0–5.0)
HCT: 30.1 % — ABNORMAL LOW (ref 36.0–46.0)
Hemoglobin: 10.1 g/dL — ABNORMAL LOW (ref 12.0–15.0)
Lymphocytes Relative: 49.5 % — ABNORMAL HIGH (ref 12.0–46.0)
Lymphs Abs: 2.2 10*3/uL (ref 0.7–4.0)
MCHC: 33.4 g/dL (ref 30.0–36.0)
MCV: 84.9 fl (ref 78.0–100.0)
Monocytes Absolute: 0.7 10*3/uL (ref 0.1–1.0)
Monocytes Relative: 14.8 % — ABNORMAL HIGH (ref 3.0–12.0)
Neutro Abs: 1.3 10*3/uL — ABNORMAL LOW (ref 1.4–7.7)
Neutrophils Relative %: 30.1 % — ABNORMAL LOW (ref 43.0–77.0)
Platelets: 190 10*3/uL (ref 150.0–400.0)
RBC: 3.55 Mil/uL — ABNORMAL LOW (ref 3.87–5.11)
RDW: 14.2 % (ref 11.5–15.5)
WBC: 4.5 10*3/uL (ref 4.0–10.5)

## 2016-08-20 MED ORDER — DULOXETINE HCL 60 MG PO CPEP: 60.0000 mg | ORAL_CAPSULE | Freq: Every day | ORAL | 3 refills | Status: DC

## 2016-08-20 MED ORDER — DULOXETINE HCL 30 MG PO CPEP: 30.0000 mg | ORAL_CAPSULE | Freq: Every day | ORAL | 1 refills | Status: DC

## 2016-08-20 NOTE — Progress Notes (Signed)
   Subjective:    Patient ID: Shelly Wilkinson, female    DOB: 12-07-1971, 45 y.o.   MRN: [id number] Cc; rectal bleeding, hemorrhoids and altered bowel habits HPI Here for f/u - long hx IBS and hemorrhoids - had PPH procedure with failure and recurrent sxs of prolapse, bloody and mucous dc Has alternating bowel habits with straining at times. Talks about anxiety issues - let's things get to her and stress her out especially with children. HAs insight as to how it affects her, has seen a therapist in past and recently. Has been on Effexor in past but it "caused problems with intimacy" - had difficult getting words out at times and had some issues w/ tapering it. Getting ready to restart teaching school.  Medications, allergies, past medical history, past surgical history, family history and social history are reviewed and updated in the EMR.   Review of Systems As above, + fatigue    Objective:   Physical Exam BP (!) 80/50 (BP Location: Left Arm, Patient Position: Sitting, Cuff Size: Normal)   Pulse 72   Ht 5' 1.8" (1.57 m)   Wt 117 lb 2 oz (53.1 kg)   BMI 21.56 kg/m  NAD       Assessment & Plan:   Encounter Diagnoses  Name Primary?  . IBS (irritable bowel syndrome) Yes  . Anxiety   . Prolapsed internal hemorrhoids, grade 3     Duloxetine 30 mg qd x 1 month then 60 mg qd, side effects discussed - hopefully better tolerated than effexor - it is effective for her problems - pain, IBS and anxiety   Anorectal manometry nwill be ordered - may have pelvic floor dysfx or other problem as cause ofaltered bowel habits  Would not have hemorrhoid surgery redo until manometry results known  RTC 3 mos CBC today Lab Results  Component Value Date   WBC 4.5 08/20/2016   HGB 10.1 (L) 08/20/2016   HCT 30.1 (L) 08/20/2016   MCV 84.9 08/20/2016   PLT 190.0 08/20/2016   Lab Results  Component Value Date   FERRITIN 6.2 (L) 08/21/2016   Lab Results  Component Value Date   VITAMINB12  243 08/21/2016   Will have her take ferrous sulfate and oral B12. She has screened negative for celiac dz in past and had a colonoscopy in  2011 w/o dangerous pathology  25 minutes time spent with patient > half in counseling coordination of care  Note - FHx says son has colon cancer - I belive that is incorrect - will clarify after visit  I appreciate the opportunity to care for this patient. WG:[id number] Theda Sers, MD Veneda Melter, MD

## 2016-08-20 NOTE — Assessment & Plan Note (Signed)
Try to control bowels better ? Needs anorectal manometry

## 2016-08-20 NOTE — Patient Instructions (Addendum)
   Your physician has requested that you go to the basement for the following lab work before leaving today: CBC/diff   We have sent the following medications to your pharmacy for you to pick up at your convenience: Duloxetine, take it for a month and then increase your dose to 68m daily (printed rx provided)    Follow up with uKoreain 3 months.   We will call you about setting up the ano rectal mano testing. I've given you instructions today and will call you with the date/time.      I appreciate the opportunity to care for you. CSilvano Rusk MD, [facility]

## 2016-08-20 NOTE — Progress Notes (Signed)
Hemoglobin is low - mild-moderate Likely from bleeding but another possibility would be low B12 Should check iron and B12 levels My chart message

## 2016-08-21 ENCOUNTER — Other Ambulatory Visit (INDEPENDENT_AMBULATORY_CARE_PROVIDER_SITE_OTHER): Payer: BLUE CROSS/BLUE SHIELD

## 2016-08-21 DIAGNOSIS — D649 Anemia, unspecified: Secondary | ICD-10-CM

## 2016-08-21 LAB — FERRITIN: Ferritin: 6.2 ng/mL — ABNORMAL LOW (ref 10.0–291.0)

## 2016-08-21 LAB — VITAMIN B12: Vitamin B-12: 243 pg/mL (ref 211–911)

## 2016-08-24 ENCOUNTER — Encounter: Payer: Self-pay | Admitting: Internal Medicine

## 2016-08-25 ENCOUNTER — Encounter: Payer: Self-pay | Admitting: Internal Medicine

## 2016-08-25 ENCOUNTER — Other Ambulatory Visit: Payer: Self-pay | Admitting: Internal Medicine

## 2016-08-25 DIAGNOSIS — D509 Iron deficiency anemia, unspecified: Secondary | ICD-10-CM

## 2016-08-25 HISTORY — DX: Iron deficiency anemia, unspecified: D50.9

## 2016-08-25 MED ORDER — VITAMIN B-12 1000 MCG PO TABS: 1000.0000 ug | ORAL_TABLET | Freq: Every day | ORAL | Status: DC

## 2016-08-25 MED ORDER — FERROUS SULFATE 325 (65 FE) MG PO TABS: 325.0000 mg | ORAL_TABLET | Freq: Two times a day (BID) | ORAL | 3 refills | Status: DC

## 2016-08-25 NOTE — Progress Notes (Signed)
Ferritin low B12 NL but low NL Will supplement seee My Chart Note

## 2016-09-03 ENCOUNTER — Encounter: Payer: Self-pay | Admitting: Internal Medicine

## 2016-09-03 NOTE — Telephone Encounter (Signed)
If she is intolerant of oral iron would offer feraheme 510 mg x 2 injections 1 week apart CBC and ferritin 1 month after last injection  Please also tell her that as I think about things - since she is iron deficient it would be appropriate to repeat a colonoscopy and make sure there is not something other than hemorrhoids bleeding  Dx iron-def anemia and rectal bleeding

## 2016-09-04 ENCOUNTER — Other Ambulatory Visit: Payer: Self-pay

## 2016-09-04 DIAGNOSIS — D509 Iron deficiency anemia, unspecified: Secondary | ICD-10-CM

## 2016-09-05 ENCOUNTER — Encounter: Payer: Self-pay | Admitting: Internal Medicine

## 2016-09-05 ENCOUNTER — Telehealth: Payer: Self-pay | Admitting: Internal Medicine

## 2016-09-05 NOTE — Telephone Encounter (Signed)
Left message for patient to call back  

## 2016-09-05 NOTE — Telephone Encounter (Signed)
Colon was scheduled with Lorriane Shire, [facility]

## 2016-09-10 ENCOUNTER — Encounter (HOSPITAL_COMMUNITY): Payer: BLUE CROSS/BLUE SHIELD

## 2016-09-10 DIAGNOSIS — G43719 Chronic migraine without aura, intractable, without status migrainosus: Secondary | ICD-10-CM | POA: Diagnosis not present

## 2016-09-12 ENCOUNTER — Ambulatory Visit (HOSPITAL_COMMUNITY)
Admission: RE | Admit: 2016-09-12 | Discharge: 2016-09-12 | Disposition: A | Discharge status: Home / Self Care | Payer: BLUE CROSS/BLUE SHIELD | Source: Ambulatory Visit | Attending: Internal Medicine | Admitting: Internal Medicine

## 2016-09-12 DIAGNOSIS — D509 Iron deficiency anemia, unspecified: Secondary | ICD-10-CM | POA: Insufficient documentation

## 2016-09-12 MED ORDER — SODIUM CHLORIDE 0.9 % IV SOLN
510.0000 mg | INTRAVENOUS | Status: DC
  Administered 2016-09-12: 510 mg via INTRAVENOUS
  Filled 2016-09-12: qty 17

## 2016-09-12 NOTE — Discharge Instructions (Signed)

## 2016-09-17 ENCOUNTER — Encounter (HOSPITAL_COMMUNITY)
Admission: RE | Disposition: A | Discharge status: Home / Self Care | Payer: Self-pay | Source: Ambulatory Visit | Attending: Internal Medicine

## 2016-09-17 ENCOUNTER — Ambulatory Visit (HOSPITAL_COMMUNITY)
Admission: RE | Admit: 2016-09-17 | Discharge: 2016-09-17 | Disposition: A | Discharge status: Home / Self Care | Payer: BLUE CROSS/BLUE SHIELD | Source: Ambulatory Visit | Attending: Internal Medicine | Admitting: Internal Medicine

## 2016-09-17 DIAGNOSIS — K5902 Outlet dysfunction constipation: Secondary | ICD-10-CM

## 2016-09-17 DIAGNOSIS — K6289 Other specified diseases of anus and rectum: Secondary | ICD-10-CM | POA: Diagnosis not present

## 2016-09-17 DIAGNOSIS — K5900 Constipation, unspecified: Secondary | ICD-10-CM | POA: Diagnosis present

## 2016-09-17 HISTORY — PX: ANAL RECTAL MANOMETRY: SHX6358

## 2016-09-17 SURGERY — MANOMETRY, ANORECTAL

## 2016-09-17 NOTE — Progress Notes (Signed)
Anal Manometry done per protocol. Pt tolerated well no complications noted. Report to be sent to Dr. Celesta Aver office.

## 2016-09-18 ENCOUNTER — Encounter (HOSPITAL_COMMUNITY): Payer: Self-pay | Admitting: Internal Medicine

## 2016-09-19 ENCOUNTER — Ambulatory Visit (HOSPITAL_COMMUNITY)
Admission: RE | Admit: 2016-09-19 | Discharge: 2016-09-19 | Disposition: A | Discharge status: Home / Self Care | Payer: BLUE CROSS/BLUE SHIELD | Source: Ambulatory Visit | Attending: Internal Medicine | Admitting: Internal Medicine

## 2016-09-19 DIAGNOSIS — D509 Iron deficiency anemia, unspecified: Secondary | ICD-10-CM | POA: Diagnosis present

## 2016-09-19 MED ORDER — SODIUM CHLORIDE 0.9 % IV SOLN
510.0000 mg | INTRAVENOUS | Status: AC
  Administered 2016-09-19: 510 mg via INTRAVENOUS
  Filled 2016-09-19: qty 17

## 2016-09-23 DIAGNOSIS — K5902 Outlet dysfunction constipation: Secondary | ICD-10-CM

## 2016-09-23 NOTE — Op Note (Signed)
Anorectal Manometry  Indications  Constipation   Interpretation / Findings  Normal anal sphincter resting and squeeze pressures  Incomplete relaxation of the anal sphincter with poor generation of intrarectal pressure during attempted defecation  Rectoanal inhibitory reflex present  Abnormal rectal sensation with very low volume for urge to defecate and maximal tolerable volume Abnormal balloon expulsion test; balloon not expelled within 3 minutes     Impression and Recommendations Normal anal sphincter pressures  Evidence of dyssynergia defecation  Abnormal rectal sensation suggestive of rectal hypersensitivity Consider referral to PT for biofeedback  K.Denzil Magnuson, MD [facility]

## 2016-09-24 ENCOUNTER — Telehealth: Payer: Self-pay | Admitting: Internal Medicine

## 2016-09-24 DIAGNOSIS — R198 Other specified symptoms and signs involving the digestive system and abdomen: Secondary | ICD-10-CM

## 2016-09-29 ENCOUNTER — Encounter: Payer: Self-pay | Admitting: Internal Medicine

## 2016-09-29 NOTE — Telephone Encounter (Signed)
   Sorry for the delay - glad you are better with duloxetine (I am assuming that is helping).  The manometry results are below.  The defecation process is not as it should be and this can be helped by physical therapy directed at retraining you and the defecation process.   I think that would be a good thing when you can make that happen - I am concerned that you might fail another hemorrhoid surgery without this though obviously could not guarantee.  If you are willing we have a great physical therapist we can send you to.  Gatha Mayer, MD, [facility]    Interpretation / Findings  Normal anal sphincter resting and squeeze pressures  Incomplete relaxation of the anal sphincter with poor generation of intrarectal pressure during attempted defecation  Rectoanal inhibitory reflex present  Abnormal rectal sensation with very low volume for urge to defecate and maximal tolerable volume Abnormal balloon expulsion test; balloon not expelled within 3 minutes   Impressionand Recommendations Normal anal sphincter pressures  Evidence of dyssynergia defecation  Abnormal rectal sensation suggestive of rectal hypersensitivity Consider referral to PT for biofeedback

## 2016-10-01 ENCOUNTER — Encounter: Payer: Self-pay | Admitting: Internal Medicine

## 2016-10-01 DIAGNOSIS — K5901 Slow transit constipation: Secondary | ICD-10-CM | POA: Insufficient documentation

## 2016-10-01 DIAGNOSIS — K5902 Outlet dysfunction constipation: Secondary | ICD-10-CM | POA: Insufficient documentation

## 2016-10-01 HISTORY — DX: Outlet dysfunction constipation: K59.02

## 2016-10-02 NOTE — Telephone Encounter (Signed)
needs pelvic floor PT  Received: Yesterday  Message Contents  Gatha Mayer, MD sent to Marlon Pel, RN        Has dyssynergic defecation and rectal hypersensitivity  Refer to Earlie Counts, PT and let patient know - also may see My Chart communications - I explained this and she accepted recommendation   Thanks      Left message for patient to call back

## 2016-10-02 NOTE — Telephone Encounter (Signed)
Patient notified of recommendations. She is aware that she will be contacted by physical therapy directly for an appt

## 2016-10-21 ENCOUNTER — Encounter: Payer: Self-pay | Admitting: Physical Therapy

## 2016-10-21 ENCOUNTER — Ambulatory Visit: Payer: BLUE CROSS/BLUE SHIELD | Attending: Internal Medicine | Admitting: Physical Therapy

## 2016-10-21 DIAGNOSIS — M62838 Other muscle spasm: Secondary | ICD-10-CM | POA: Diagnosis present

## 2016-10-21 DIAGNOSIS — R279 Unspecified lack of coordination: Secondary | ICD-10-CM | POA: Insufficient documentation

## 2016-10-21 DIAGNOSIS — M6281 Muscle weakness (generalized): Secondary | ICD-10-CM | POA: Insufficient documentation

## 2016-10-21 NOTE — Therapy (Signed)
[facility] [address], [place], [place], [address] Phone: [phone]   Fax:  [phone]  Physical Therapy Evaluation  Patient Details  Name: Shelly Wilkinson MRN: [id number] Date of Birth: Feb 06, 1971 Referring Provider: Dr. Silvano Rusk  Encounter Date: 10/21/2016      PT End of Session - 10/21/16 1623    Visit Number 1   Date for PT Re-Evaluation 02/21/17   PT Start Time 1531   PT Stop Time 1616   PT Time Calculation (min) 45 min   Activity Tolerance Patient tolerated treatment well   Behavior During Therapy [facility] for tasks assessed/performed      Past Medical History:  Diagnosis Date  . Anemia, iron deficiency 08/25/2016  . Arthritis   . Chronic anal fissure   . Complication of anesthesia    headaches/   06-22-2014 surg. had urinary retention  . Dyssynergic constipation 10/01/2016   Anorectal mano  . GERD (gastroesophageal reflux disease)   . Hyperlipidemia   . Hypothyroidism   . IBS (irritable bowel syndrome)   . IC (interstitial cystitis)   . Migraines   . Wears glasses     Past Surgical History:  Procedure Laterality Date  . ANAL RECTAL MANOMETRY N/A 09/17/2016   Procedure: ANO RECTAL MANOMETRY;  Surgeon: Gatha Mayer, MD;  Location: [facility];  Service: Endoscopy;  Laterality: N/A;  . CHEILECTOMY Right 12/15/2013   Procedure: [place];  Surgeon: Wylene Simmer, MD;  Location: [place];  Service: Orthopedics;  Laterality: Right;  . CHEILECTOMY Left 02-12-2006   great toe  . COLONOSCOPY  10-02-2010  . CYSTO/  HYDRODISTENTION/  INSTILLATION THERAPY  03-19-2010  . D & C HYSTEROSCOPY /  HYDROTHERMAL ENDOMETRIAL ABLATION/  LEEP  05-29-2011  . DILATION AND EVACUATION  2002  . EVALUATION UNDER ANESTHESIA WITH ANAL FISTULECTOMY N/A 12/26/2014   Procedure: EXAM UNDER ANESTHESIA ;  Surgeon: Leighton Ruff, MD;  Location: [facility];  Service: General;   Laterality: N/A;  . HEMORRHOID BANDING  02-16-2014  . REPAIR LACERATED RADIAL DIGITAL NERVE LEFT RING FINGER  08-25-2005  . SPHINCTEROTOMY N/A 12/26/2014   Procedure: CHEMICAL SPHINCTEROTOMY (BOTOX);  Surgeon: Leighton Ruff, MD;  Location: [facility];  Service: General;  Laterality: N/A;  . TRANSANAL HEMORRHOIDAL DEARTERIALIZATION  06-22-2014    There were no vitals filed for this visit.       Subjective Assessment - 10/21/16 1545    Subjective Patient reports IBS for several years and it is painful.  When she has to have a bowel movement she is unable to put it off.  Cymbalta has helped with stomach cramps.  Patient has not been rigorous with her diet.  Patient reports a bowel movement is daily and if misses a day she will have alot of trouble.  Hemmorroids are trouble especially in standing and walking.    Patient Stated Goals go to rest room without pain, hemmorroids can clear up   Currently in Pain? Yes   Pain Score 4    Pain Location Rectum   Pain Orientation Mid   Pain Descriptors / Indicators Burning   Pain Type Chronic pain   Pain Onset More than a month ago   Pain Frequency Intermittent   Aggravating Factors  bowel movement   Pain Relieving Factors after bowel movement   Multiple Pain Sites No            [facility] PT Assessment - 10/21/16 0001  Assessment   Medical Diagnosis R19.8 Abnormal defecation   Referring Provider Dr. Silvano Rusk   Onset Date/Surgical Date 01/06/14   Prior Therapy none     Precautions   Precautions None     Restrictions   Weight Bearing Restrictions No     Balance Screen   Has the patient fallen in the past 6 months No   Has the patient had a decrease in activity level because of a fear of falling?  No   Is the patient reluctant to leave their home because of a fear of falling?  No     Home Ecologist residence     Prior Function   Level of Independence Independent   Vocation Part  time employment   Vocation Requirements standing   Leisure walking     Cognition   Overall Cognitive Status Within Functional Limits for tasks assessed     Observation/Other Assessments   Focus on Therapeutic Outcomes (FOTO)  40% limitation     ROM / Strength   AROM / PROM / Strength AROM;Strength     AROM   Overall AROM Comments lumbar extension decreased by 25%     Strength   Overall Strength Comments abdominal strength is 1/5 with difficulty contracting the lower abdominals     Palpation   SI assessment  right ilium is anteriorly rotated; sacrum rotated right                 Pelvic Floor Special Questions - 10/21/16 0001    Prior Pregnancies Yes   Number of Vaginal Deliveries 2   Diastasis Recti 1/2 finger below the umbilicus   Currently Sexually Active Yes   Is this Painful No   Marinoff Scale no problems   Urinary Leakage No   Urinary urgency Yes  fecal urgency   Fecal incontinence Yes  little liquid after a bowel movement   Skin Integrity Hemorroids  darker skin around the anus   Pelvic Floor Internal Exam Patient confirms identification and approves PT to assess muscle integrity   Exam Type Rectal  left sidely   Palpation tenderness located in left puborectalis,ischiococcygeus,  obturator internist,    Strength fair squeeze, definite lift  lag of internal sphincter contraction   Tone hypertonicity                  PT Education - 10/21/16 1625    Education provided No          PT Short Term Goals - 10/21/16 1722      PT SHORT TERM GOAL #1   Title independent with initial HEP   Time 4   Period Weeks   Status New     PT SHORT TERM GOAL #2   Title understand how to toilet correctly to relax pelvic floor instead of contracting   Time 4   Period Weeks   Status New     PT SHORT TERM GOAL #3   Title pain with bowel movement decreased >/= 25% due to reduction of trigger points in the rectum   Time 4   Period Weeks   Status New            PT Long Term Goals - 10/21/16 1723      PT LONG TERM GOAL #1   Title independent with HEP   Time 4   Period Months   Status New     PT LONG TERM GOAL #2   Title  ability to have a bowel movement with pain decreased >/= 75%   Time 4   Period Weeks   Status New     PT LONG TERM GOAL #3   Title ability to have a bowel movement without straining due to ability to push stool out.    Time 4   Period Months   Status New     PT LONG TERM GOAL #4   Title abdominal strength is 3/5 with ability to contract and not push outward    Time 4   Period Months   Status New     PT LONG TERM GOAL #5   Title FOTO score is </=35% limitation   Time 4   Period Months   Status New     Additional Long Term Goals   Additional Long Term Goals Yes     PT LONG TERM GOAL #6   Title when patient has the urge to have a bowel movement she is able to wait 10 minutes    Time 4   Period Months   Status New               Plan - 10/21/16 1625    Clinical Impression Statement Patient is a 45 year old female with diagnosis of abnormal defecation.  Patient reports rectal pain at level 4/10 when having a bowel movement.  Patient reports fecal leakage several hours after a bowel movement. Patient reports no urinary leakage.  Pelvic floor strength is 2/5 and not able to lift pelvic floor muscles and puborectalis contract later.  Anal sphincters feel hypertonic.  Palpable tenderness located in left puborectalis, ischicoccygeus, and obturator internist. Right ilium is anteriorly rotated and sacrum rotated right. Patient is moderately complex evaluation due to her evolving condition and comorbidities such as hemmorroids, IBS, diverticulitis, and history of anal fissure that will impact care provided.  Patient will benefit from skilled therapy to reduce her rectal pain with defecation and improve coordination of the pelvic floor muscles.    Rehab Potential Excellent   Clinical Impairments  Affecting Rehab Potential None   PT Frequency 1x / week   PT Duration Other (comment)  4 months   PT Treatment/Interventions Biofeedback;Cryotherapy;Electrical Stimulation;Moist Heat;Therapeutic activities;Therapeutic exercise;Neuromuscular re-education;Patient/family education;Manual techniques   PT Next Visit Plan diaphragmatic breathing, soft tissue work anally, soft tissue work to have abdominals come together, abdominal bracing, pelvic floor meditation; correct pelvis   PT Home Exercise Plan toileting technique, diaphragmatic breathing   Recommended Other Services None   Consulted and Agree with Plan of Care Patient      Patient will benefit from skilled therapeutic intervention in order to improve the following deficits and impairments:  Pain, Decreased coordination, Increased fascial restricitons, Decreased strength, Decreased activity tolerance  Visit Diagnosis: Muscle weakness (generalized) - Plan: PT plan of care cert/re-cert  Unspecified lack of coordination - Plan: PT plan of care cert/re-cert  Other muscle spasm - Plan: PT plan of care cert/re-cert     Problem List Patient Active Problem List   Diagnosis Date Noted  . Dyssynergic constipation 10/01/2016  . Constipation due to outlet dysfunction   . Anemia, iron deficiency 08/25/2016  . Prolapsed internal hemorrhoids, grade 3 02/27/2016  . Interstitial cystitis 10/19/2012  . Migraine headache 10/19/2012  . Irritable bowel syndrome 09/11/2010    Earlie Counts, PT 10/21/16 5:37 PM   [facility] [address] [place], [place], [address] Phone: [phone]   Fax:  [phone]  Name: Shelly Wilkinson MRN: [id number] Date of Birth: November 01, 1971

## 2016-10-23 ENCOUNTER — Ambulatory Visit (AMBULATORY_SURGERY_CENTER): Payer: Self-pay

## 2016-10-23 VITALS — Ht 62.0 in | Wt 118.6 lb

## 2016-10-23 DIAGNOSIS — Z800 Family history of malignant neoplasm of digestive organs: Secondary | ICD-10-CM

## 2016-10-23 NOTE — Progress Notes (Signed)
No allergies to eggs or soy No diet meds No home oxygen ANESTHESIA CAN START A MIGRAINE  Declined emmi

## 2016-10-24 ENCOUNTER — Encounter: Payer: Self-pay | Admitting: Internal Medicine

## 2016-10-30 ENCOUNTER — Telehealth: Payer: Self-pay | Admitting: Internal Medicine

## 2016-10-30 NOTE — Telephone Encounter (Signed)
Instructed okay to take motrin, migraine meds if needed before procedure- make sure all in 3 hours before procedure time , instructed dark sodas like coke are ok as well.Lelan Pons PV

## 2016-10-31 DIAGNOSIS — R05 Cough: Secondary | ICD-10-CM | POA: Diagnosis not present

## 2016-11-04 ENCOUNTER — Encounter: Payer: Self-pay | Admitting: Physical Therapy

## 2016-11-04 ENCOUNTER — Ambulatory Visit: Payer: BLUE CROSS/BLUE SHIELD | Attending: Internal Medicine | Admitting: Physical Therapy

## 2016-11-04 DIAGNOSIS — R279 Unspecified lack of coordination: Secondary | ICD-10-CM | POA: Diagnosis present

## 2016-11-04 DIAGNOSIS — M62838 Other muscle spasm: Secondary | ICD-10-CM | POA: Insufficient documentation

## 2016-11-04 DIAGNOSIS — M6281 Muscle weakness (generalized): Secondary | ICD-10-CM

## 2016-11-04 NOTE — Patient Instructions (Addendum)
Toileting Techniques for Bowel Movements (Defecation) Using your belly (abdomen) and pelvic floor muscles to have a bowel movement is usually instinctive.  Sometimes people can have problems with these muscles and have to relearn proper defecation (emptying) techniques.  If you have weakness in your muscles, organs that are falling out, decreased sensation in your pelvis, or ignore your urge to go, you may find yourself straining to have a bowel movement.  You are straining if you are: . holding your breath or taking in a huge gulp of air and holding it  . keeping your lips and jaw tensed and closed tightly . turning red in the face because of excessive pushing or forcing . developing or worsening your  hemorrhoids . getting faint while pushing . not emptying completely and have to defecate many times a day  If you are straining, you are actually making it harder for yourself to have a bowel movement.  Many people find they are pulling up with the pelvic floor muscles and closing off instead of opening the anus. Due to lack pelvic floor relaxation and coordination the abdominal muscles, one has to work harder to push the feces out.  Many people have never been taught how to defecate efficiently and effectively.  Notice what happens to your body when you are having a bowel movement.  While you are sitting on the toilet pay attention to the following areas: . Jaw and mouth position . Angle of your hips   . Whether your feet touch the ground or not . Arm placement  . Spine position . Waist . Belly tension . Anus (opening of the anal canal)  An Evacuation/Defecation Plan   Here are the 4 basic points:  1. Lean forward enough for your elbows to rest on your knees 2. Support your feet on the floor or use a low stool if your feet don't touch the floor  3. Push out your belly as if you have swallowed a beach ball-you should feel a widening of your waist 4. Open and relax your pelvic floor muscles,  rather than tightening around the anus      The following conditions my require modifications to your toileting posture:  . If you have had surgery in the past that limits your back, hip, pelvic, knee or ankle flexibility . Constipation   Your healthcare practitioner may make the following additional suggestions and adjustments:  1) Sit on the toilet  a) Make sure your feet are supported. b) Notice your hip angle and spine position-most people find it effective to lean forward or raise their knees, which can help the muscles around the anus to relax  c) When you lean forward, place your forearms on your thighs for support  2) Relax suggestions a) Breath deeply in through your nose and out slowly through your mouth as if you are smelling the flowers and blowing out the candles. b) To become aware of how to relax your muscles, contracting and releasing muscles can be helpful.  Pull your pelvic floor muscles in tightly by using the image of holding back gas, or closing around the anus (visualize making a circle smaller) and lifting the anus up and in.  Then release the muscles and your anus should drop down and feel open. Repeat 5 times ending with the feeling of relaxation. c) Keep your pelvic floor muscles relaxed; let your belly bulge out. d) The digestive tract starts at the mouth and ends at the anal opening, so be  sure to relax both ends of the tube.  Place your tongue on the roof of your mouth with your teeth separated.  This helps relax your mouth and will help to relax the anus at the same time.  3) Empty (defecation) a) Keep your pelvic floor and sphincter relaxed, then bulge your anal muscles.  Make the anal opening wide.  b) Stick your belly out as if you have swallowed a beach ball. c) Make your belly wall hard using your belly muscles while continuing to breathe. Doing this makes it easier to open your anus. d) Breath out and give a grunt (or try using other sounds such as  ahhhh, shhhhh, ohhhh or grrrrrrr).  4) Finish a) As you finish your bowel movement, pull the pelvic floor muscles up and in.  This will leave your anus in the proper place rather than remaining pushed out and down. If you leave your anus pushed out and down, it will start to feel as though that is normal and give you incorrect signals about needing to have a bowel movement.    Earlie Counts, PT [facility] [address] [place], [place] [address]  Guided Meditation for Pelvic Floor relaxation by [employer] Meditation PhysioYoga by Carolynn Comment Legs up the wall: Pelvic Floor + Leg movement by Darrick Penna [employer] Morning Yoga: 20 minute wake up yoga by [employer]  About Abdominal Massage  Abdominal massage, also called external colon massage, is a self-treatment circular massage technique that can reduce and eliminate gas and ease constipation. The colon naturally contracts in waves in a clockwise direction starting from inside the right hip, moving up toward the ribs, across the belly, and down inside the left hip.  When you perform circular abdominal massage, you help stimulate your colon's normal wave pattern of movement called peristalsis.  It is most beneficial when done after eating.  Positioning You can practice abdominal massage with oil while lying down, or in the shower with soap.  Some people find that it is just as effective to do the massage through clothing while sitting or standing.  How to Massage Start by placing your finger tips or knuckles on your right side, just inside your hip bone.  . Make small circular movements while you move upward toward your rib cage.   . Once you reach the bottom right side of your rib cage, take your circular movements across to the left side of the bottom of your rib cage.  . Next, move downward until you reach the inside of your left hip bone.  This is the path your feces travel in your  colon. . Continue to perform your abdominal massage in this pattern for 10 minutes each day.     You can apply as much pressure as is comfortable in your massage.  Start gently and build pressure as you continue to practice.  Notice any areas of pain as you massage; areas of slight pain may be relieved as you massage, but if you have areas of significant or intense pain, consult with your healthcare provider.  Other Considerations . General physical activity including bending and stretching can have a beneficial massage-like effect on the colon.  Deep breathing can also stimulate the colon because breathing deeply activates the same nervous system that supplies the colon.   . Abdominal massage should always be used in combination with a bowel-conscious diet that is high in the proper type of fiber for you, fluids (  primarily water), and a regular exercise program.

## 2016-11-04 NOTE — Therapy (Signed)
[facility] [address] [place], [place], [address] Phone: [phone]   Fax:  [phone]  Physical Therapy Treatment  Patient Details  Name: Shelly Wilkinson MRN: [id number] Date of Birth: [birth year] Referring Provider: Dr. Silvano Rusk  Encounter Date: 11/04/2016      PT End of Session - 11/04/16 1521    Visit Number 2   Date for PT Re-Evaluation 02/21/17   PT Start Time 1448   PT Stop Time 1528   PT Time Calculation (min) 40 min   Activity Tolerance Patient tolerated treatment well   Behavior During Therapy [facility] for tasks assessed/performed      Past Medical History:  Diagnosis Date  . Anemia, iron deficiency 08/25/2016  . Arthritis   . Chronic anal fissure   . Complication of anesthesia    headaches/   06-22-2014 surg. had urinary retention  . Dyssynergic constipation 10/01/2016   Anorectal mano  . GERD (gastroesophageal reflux disease)   . Hyperlipidemia   . Hypothyroidism   . IBS (irritable bowel syndrome)   . IC (interstitial cystitis)   . Migraines   . Wears glasses     Past Surgical History:  Procedure Laterality Date  . ANAL RECTAL MANOMETRY N/A 09/17/2016   Procedure: ANO RECTAL MANOMETRY;  Surgeon: Gatha Mayer, MD;  Location: [facility];  Service: Endoscopy;  Laterality: N/A;  . CHEILECTOMY Right 12/15/2013   Procedure: [place];  Surgeon: Wylene Simmer, MD;  Location: [place];  Service: Orthopedics;  Laterality: Right;  . CHEILECTOMY Left 02-12-2006   great toe  . COLONOSCOPY  10-02-2010  . CYSTO/  HYDRODISTENTION/  INSTILLATION THERAPY  03-19-2010  . D & C HYSTEROSCOPY /  HYDROTHERMAL ENDOMETRIAL ABLATION/  LEEP  05-29-2011  . DILATION AND EVACUATION  2002  . EVALUATION UNDER ANESTHESIA WITH ANAL FISTULECTOMY N/A 12/26/2014   Procedure: EXAM UNDER ANESTHESIA ;  Surgeon: Leighton Ruff, MD;  Location: [facility];  Service: General;   Laterality: N/A;  . HEMORRHOID BANDING  02-16-2014  . REPAIR LACERATED RADIAL DIGITAL NERVE LEFT RING FINGER  08-25-2005  . SPHINCTEROTOMY N/A 12/26/2014   Procedure: CHEMICAL SPHINCTEROTOMY (BOTOX);  Surgeon: Leighton Ruff, MD;  Location: [facility];  Service: General;  Laterality: N/A;  . TRANSANAL HEMORRHOIDAL DEARTERIALIZATION  06-22-2014    There were no vitals filed for this visit.      Subjective Assessment - 11/04/16 1448    Subjective Pt is concerned because she has a colonostomy and is worried about the prep.   Patient Stated Goals go to rest room without pain, hemmorroids can clear up                         [facility] Adult PT Treatment/Exercise - 11/04/16 0001      Posture/Postural Control   Posture Comments discussed upright posture, no crossing legs, no slouching     Self-Care   Self-Care Other Self-Care Comments   Other Self-Care Comments  instructed on you tube video for pelvic floor mediation during toileting and for relaxation     Therapeutic Activites    Therapeutic Activities Other Therapeutic Activities   Other Therapeutic Activities correct toileting technique for bowel movement     Manual Therapy   Manual Therapy Soft tissue mobilization;Myofascial release   Soft tissue mobilization circular massage to abdomen to improve peristalic motion of intestines  heard bowel sounds during massage   Myofascial Release  myofascial lifting through transverse abdominis, left psoas release, lifting small intestines, ileocecal valve release  heard bowel sounds throughout treatment                PT Education - 11/04/16 1519    Education provided Yes   Education Details toileting techniques, meditation and yoga on you tube, abdominal circular massage techniques   Person(s) Educated Patient   Methods Explanation;Demonstration;Handout   Comprehension Verbalized understanding;Returned demonstration          PT Short Term Goals -  11/04/16 1712      PT SHORT TERM GOAL #1   Title independent with initial HEP   Time 4   Period Weeks   Status Partially Met     PT SHORT TERM GOAL #2   Title understand how to toilet correctly to relax pelvic floor instead of contracting   Time 4   Period Weeks   Status Partially Met     PT SHORT TERM GOAL #3   Title pain with bowel movement decreased >/= 25% due to reduction of trigger points in the rectum   Time 4   Period Weeks   Status New           PT Long Term Goals - 10/21/16 1723      PT LONG TERM GOAL #1   Title independent with HEP   Time 4   Period Months   Status New     PT LONG TERM GOAL #2   Title ability to have a bowel movement with pain decreased >/= 75%   Time 4   Period Weeks   Status New     PT LONG TERM GOAL #3   Title ability to have a bowel movement without straining due to ability to push stool out.    Time 4   Period Months   Status New     PT LONG TERM GOAL #4   Title abdominal strength is 3/5 with ability to contract and not push outward    Time 4   Period Months   Status New     PT LONG TERM GOAL #5   Title FOTO score is </=35% limitation   Time 4   Period Months   Status New     Additional Long Term Goals   Additional Long Term Goals Yes     PT LONG TERM GOAL #6   Title when patient has the urge to have a bowel movement she is able to wait 10 minutes    Time 4   Period Months   Status New               Plan - 11/04/16 1529    Clinical Impression Statement Pt hasn't met goals due to her first treatment session.  Pt understands correct posture and toileting techniques.  Pt understands abdominal massage for improved bowel function.  Pt was educated on meditation and yoga for relaxation.  Pt will benefit from skilled therapy to reduce her rectal pain with defecation and improve coordination of the pelvic floor muscles.   Rehab Potential Excellent   Clinical Impairments Affecting Rehab Potential None   PT  Frequency 1x / week   PT Duration Other (comment)  4 months   PT Treatment/Interventions Biofeedback;Cryotherapy;Electrical Stimulation;Moist Heat;Therapeutic activities;Therapeutic exercise;Neuromuscular re-education;Patient/family education;Manual techniques   PT Next Visit Plan soft tissue work anally, soft tissue work to have abdominals come together, abdominal bracing; correct pelvis   PT Home Exercise Plan progress as needed  Consulted and Agree with Plan of Care Patient      Patient will benefit from skilled therapeutic intervention in order to improve the following deficits and impairments:  Pain, Decreased coordination, Increased fascial restricitons, Decreased strength, Decreased activity tolerance  Visit Diagnosis: Muscle weakness (generalized)  Unspecified lack of coordination  Other muscle spasm     Problem List Patient Active Problem List   Diagnosis Date Noted  . Dyssynergic constipation 10/01/2016  . Constipation due to outlet dysfunction   . Anemia, iron deficiency 08/25/2016  . Prolapsed internal hemorrhoids, grade 3 02/27/2016  . Interstitial cystitis 10/19/2012  . Migraine headache 10/19/2012  . Irritable bowel syndrome 09/11/2010    Earlie Counts, PT 11/04/16 5:14 PM   [facility] [address], [place], [place], [address] Phone: [phone]   Fax:  [phone]  Name: Shelly Wilkinson MRN: [id number] Date of Birth: Feb 05, 1971

## 2016-11-06 ENCOUNTER — Encounter: Payer: Self-pay | Admitting: Internal Medicine

## 2016-11-06 ENCOUNTER — Ambulatory Visit (AMBULATORY_SURGERY_CENTER): Payer: BLUE CROSS/BLUE SHIELD | Admitting: Internal Medicine

## 2016-11-06 VITALS — BP 102/68 | HR 69 | Temp 97.8°F | Resp 27 | Ht 62.0 in | Wt 117.0 lb

## 2016-11-06 DIAGNOSIS — D509 Iron deficiency anemia, unspecified: Secondary | ICD-10-CM

## 2016-11-06 DIAGNOSIS — K529 Noninfective gastroenteritis and colitis, unspecified: Secondary | ICD-10-CM | POA: Diagnosis not present

## 2016-11-06 DIAGNOSIS — Z800 Family history of malignant neoplasm of digestive organs: Secondary | ICD-10-CM | POA: Diagnosis not present

## 2016-11-06 DIAGNOSIS — K633 Ulcer of intestine: Secondary | ICD-10-CM

## 2016-11-06 DIAGNOSIS — K5190 Ulcerative colitis, unspecified, without complications: Secondary | ICD-10-CM | POA: Diagnosis not present

## 2016-11-06 MED ORDER — SODIUM CHLORIDE 0.9 % IV SOLN: 500.0000 mL | INTRAVENOUS | Status: DC

## 2016-11-06 NOTE — Patient Instructions (Addendum)
   There was inflammation seen in the left colon - tiny ulcers - biopsies taken.  Crohn's disease is a possibility.  Hemorrhoids as you know.  I will let you know.  I appreciate the opportunity to care for you. Gatha Mayer, MD, FACG   YOU HAD AN ENDOSCOPIC PROCEDURE TODAY AT [facility]:   Refer to the procedure report that was given to you for any specific questions about what was found during the examination.  If the procedure report does not answer your questions, please call your gastroenterologist to clarify.  If you requested that your care partner not be given the details of your procedure findings, then the procedure report has been included in a sealed envelope for you to review at your convenience later.  YOU SHOULD EXPECT: Some feelings of bloating in the abdomen. Passage of more gas than usual.  Walking can help get rid of the air that was put into your GI tract during the procedure and reduce the bloating. If you had a lower endoscopy (such as a colonoscopy or flexible sigmoidoscopy) you may notice spotting of blood in your stool or on the toilet paper. If you underwent a bowel prep for your procedure, you may not have a normal bowel movement for a few days.  Please Note:  You might notice some irritation and congestion in your nose or some drainage.  This is from the oxygen used during your procedure.  There is no need for concern and it should clear up in a day or so.  SYMPTOMS TO REPORT IMMEDIATELY:   Following lower endoscopy (colonoscopy or flexible sigmoidoscopy):  Excessive amounts of blood in the stool  Significant tenderness or worsening of abdominal pains  Swelling of the abdomen that is new, acute  Fever of 100F or higher  For urgent or emergent issues, a gastroenterologist can be reached at any hour by calling [phone].   DIET:  We do recommend a small meal at first, but then you may proceed to your regular diet.  Drink plenty  of fluids but you should avoid alcoholic beverages for 24 hours.  ACTIVITY:  You should plan to take it easy for the rest of today and you should NOT DRIVE or use heavy machinery until tomorrow (because of the sedation medicines used during the test).    FOLLOW UP: Our staff will call the number listed on your records the next business day following your procedure to check on you and address any questions or concerns that you may have regarding the information given to you following your procedure. If we do not reach you, we will leave a message.  However, if you are feeling well and you are not experiencing any problems, there is no need to return our call.  We will assume that you have returned to your regular daily activities without incident.  If any biopsies were taken you will be contacted by phone or by letter within the next 1-3 weeks.  Please call us at [phone] if you have not heard about the biopsies in 3 weeks.   SIGNATURES/CONFIDENTIALITY: You and/or your care partner have signed paperwork which will be entered into your electronic medical record.  These signatures attest to the fact that that the information above on your After Visit Summary has been reviewed and is understood.  Full responsibility of the confidentiality of this discharge information lies with you and/or your care-partner.

## 2016-11-06 NOTE — Progress Notes (Signed)
Report to PACU, RN, vss, BBS= Clear.  

## 2016-11-06 NOTE — Progress Notes (Signed)
Called to room to assist during endoscopic procedure.  Patient ID and intended procedure confirmed with present staff. Received instructions for my participation in the procedure from the performing physician.  

## 2016-11-06 NOTE — Op Note (Signed)
[place] Patient Name: Shelly Wilkinson Procedure Date: 11/06/2016 11:57 AM MRN: [id number] Endoscopist: Gatha Mayer , MD Age: 45 Referring MD:  Date of Birth: 1971/01/27 Gender: Female Account #: [id number] Procedure:                Colonoscopy Indications:              Unexplained iron deficiency anemia Medicines:                Propofol per Anesthesia, Monitored Anesthesia Care Procedure:                Pre-Anesthesia Assessment:                           - Prior to the procedure, a History and Physical                            was performed, and patient medications and                            allergies were reviewed. The patient's tolerance of                            previous anesthesia was also reviewed. The risks                            and benefits of the procedure and the sedation                            options and risks were discussed with the patient.                            All questions were answered, and informed consent                            was obtained. Prior Anticoagulants: The patient has                            taken no previous anticoagulant or antiplatelet                            agents. ASA Grade Assessment: II - A patient with                            mild systemic disease. After reviewing the risks                            and benefits, the patient was deemed in                            satisfactory condition to undergo the procedure.                           After obtaining informed consent, the colonoscope  was passed under direct vision. Throughout the                            procedure, the patient's blood pressure, pulse, and                            oxygen saturations were monitored continuously. The                            Model CF-HQ190L [id number]) scope was introduced                            through the anus and advanced to the the terminal   ileum, with identification of the appendiceal                            orifice and IC valve. The colonoscopy was somewhat                            difficult due to significant looping. Successful                            completion of the procedure was aided by using                            manual pressure. The patient tolerated the                            procedure well. The quality of the bowel                            preparation was good. The bowel preparation used                            was Miralax. The terminal ileum, ileocecal valve,                            appendiceal orifice, and rectum were photographed. Scope In: 12:08:50 PM Scope Out: 12:27:21 PM Scope Withdrawal Time: 0 hours 12 minutes 6 seconds  Total Procedure Duration: 0 hours 18 minutes 31 seconds  Findings:                 The perianal and digital rectal examinations were                            normal.                           The terminal ileum appeared normal.                           A scattered area of mildly altered vascular and                            ulcerated mucosa was found in the  proximal rectum,                            in the sigmoid colon and in the descending colon.                            Biopsies were taken with a cold forceps for                            histology. Verification of patient identification                            for the specimen was done. Estimated blood loss was                            minimal.                           A prior hemorrhoidectomy scar was found in the                            distal rectum.                           External and internal hemorrhoids were found. The                            hemorrhoids were moderate.                           The exam was otherwise without abnormality on                            direct and retroflexion views. Complications:            No immediate complications. Estimated Blood Loss:      Estimated blood loss was minimal. Impression:               - The examined portion of the ileum was normal.                           - Altered vascular and ulcerated mucosa in the                            proximal rectum, in the sigmoid colon and in the                            descending colon. Biopsied.                           - Scar in the distal rectum.                           - External and internal hemorrhoids.                           -  The examination was otherwise normal on direct                            and retroflexion views.                           -                           SCATTERED SMALL EROSIONS/ULCERS IN LEFT COLON DOWN                            TO PROXIMAL RECTUM - PHENOTYPE SUGGESTS CROHN'S                            DISEASE TO ME Recommendation:           - Patient has a contact number available for                            emergencies. The signs and symptoms of potential                            delayed complications were discussed with the                            patient. Return to normal activities tomorrow.                            Written discharge instructions were provided to the                            patient.                           - Resume previous diet.                           - Continue present medications.                           - Repeat colonoscopy is recommended. The                            colonoscopy date will be determined after pathology                            results from today's exam become available for                            review. Gatha Mayer, MD 11/06/2016 12:38:39 PM This report has been signed electronically.

## 2016-11-07 ENCOUNTER — Telehealth: Payer: Self-pay

## 2016-11-07 NOTE — Telephone Encounter (Signed)
  Follow up Call-  Call back number 11/06/2016  Post procedure Call Back phone  # [phone]  Permission to leave phone message Yes  Some recent data might be hidden    Patient was called for follow up after her procedure on 11/06/2016. No answer at the number given for follow up phone call. A message was left on the answering machine.

## 2016-11-07 NOTE — Telephone Encounter (Signed)
  Follow up Call-  Call back number 11/06/2016  Post procedure Call Back phone  # [phone]  Permission to leave phone message Yes  Some recent data might be hidden     Patient questions:  Do you have a fever, pain , or abdominal swelling? No. Pain Score  0 *  Have you tolerated food without any problems? Yes.    Have you been able to return to your normal activities? Yes.    Do you have any questions about your discharge instructions: Diet   No. Medications  No. Follow up visit  No.  Do you have questions or concerns about your Care? No.  Actions: * If pain score is 4 or above: No action needed, pain <4.

## 2016-11-11 ENCOUNTER — Ambulatory Visit: Payer: BLUE CROSS/BLUE SHIELD | Admitting: Physical Therapy

## 2016-11-11 ENCOUNTER — Encounter: Payer: Self-pay | Admitting: Physical Therapy

## 2016-11-11 DIAGNOSIS — M6281 Muscle weakness (generalized): Secondary | ICD-10-CM | POA: Diagnosis not present

## 2016-11-11 DIAGNOSIS — R279 Unspecified lack of coordination: Secondary | ICD-10-CM

## 2016-11-11 DIAGNOSIS — M62838 Other muscle spasm: Secondary | ICD-10-CM

## 2016-11-11 NOTE — Therapy (Addendum)
[facility] [address] [place], [place], [address] Phone: [phone]   Fax:  [phone]  Physical Therapy Treatment  Patient Details  Name: Shelly Wilkinson MRN: [id number] Date of Birth: 1971-02-08 Referring Provider: Dr. Silvano Rusk  Encounter Date: 11/11/2016      PT End of Session - 11/11/16 1527    Visit Number 3   Date for PT Re-Evaluation 02/21/17   PT Start Time 1445   PT Stop Time 1527   PT Time Calculation (min) 42 min   Activity Tolerance Patient tolerated treatment well   Behavior During Therapy [facility] for tasks assessed/performed      Past Medical History:  Diagnosis Date  . Anemia, iron deficiency 08/25/2016  . Arthritis   . Chronic anal fissure   . Complication of anesthesia    headaches/   06-22-2014 surg. had urinary retention  . Dyssynergic constipation 10/01/2016   Anorectal mano  . GERD (gastroesophageal reflux disease)   . Hyperlipidemia   . Hypothyroidism   . IBS (irritable bowel syndrome)   . IC (interstitial cystitis)   . Migraines   . Wears glasses     Past Surgical History:  Procedure Laterality Date  . ANAL RECTAL MANOMETRY N/A 09/17/2016   Procedure: ANO RECTAL MANOMETRY;  Surgeon: Gatha Mayer, MD;  Location: [facility];  Service: Endoscopy;  Laterality: N/A;  . CHEILECTOMY Right 12/15/2013   Procedure: [place];  Surgeon: Wylene Simmer, MD;  Location: [place];  Service: Orthopedics;  Laterality: Right;  . CHEILECTOMY Left 02-12-2006   great toe  . COLONOSCOPY  10-02-2010  . CYSTO/  HYDRODISTENTION/  INSTILLATION THERAPY  03-19-2010  . D & C HYSTEROSCOPY /  HYDROTHERMAL ENDOMETRIAL ABLATION/  LEEP  05-29-2011  . DILATION AND EVACUATION  2002  . EVALUATION UNDER ANESTHESIA WITH ANAL FISTULECTOMY N/A 12/26/2014   Procedure: EXAM UNDER ANESTHESIA ;  Surgeon: Leighton Ruff, MD;  Location: [facility];  Service: General;   Laterality: N/A;  . HEMORRHOID BANDING  02-16-2014  . REPAIR LACERATED RADIAL DIGITAL NERVE LEFT RING FINGER  08-25-2005  . SPHINCTEROTOMY N/A 12/26/2014   Procedure: CHEMICAL SPHINCTEROTOMY (BOTOX);  Surgeon: Leighton Ruff, MD;  Location[facility];  Service: General;  Laterality: N/A;  . TRANSANAL HEMORRHOIDAL DEARTERIALIZATION  06-22-2014    There were no vitals filed for this visit.      Subjective Assessment - 11/11/16 1451    Subjective I have ulcers and looks like Chrons. I did the pelvic floor meditation, massaging my stomach and doing the bathroom technique. I had a small bowel movement. I see the doctor on 11/18/2016.    Patient Stated Goals go to rest room without pain, hemmorroids can clear up   Currently in Pain? Yes   Pain Score 4    Pain Location Rectum   Pain Orientation Mid   Pain Descriptors / Indicators Burning   Pain Type Chronic pain   Pain Onset More than a month ago   Pain Frequency Intermittent   Aggravating Factors  bowel movement   Pain Relieving Factors after bowel movement   Multiple Pain Sites No                      Pelvic Floor Special Questions - 11/11/16 0001    Pelvic Floor Internal Exam Patient confirms identification and approves PT to assess muscle integrity   Exam Type Rectal  left sidely  [facility] Adult PT Treatment/Exercise - 11/11/16 0001      Manual Therapy   Manual Therapy Soft tissue mobilization;Internal Pelvic Floor   Soft tissue mobilization external anal area by levator ani in sidely   Internal Pelvic Floor internal sphinter and puborectalis  but stopped due to patient feeling like she was going to have a bowel movement                PT Education - 11/11/16 1527    Education provided Yes   Education Details stretches   Person(s) Educated Patient   Methods Explanation;Demonstration;Verbal cues;Handout   Comprehension Returned demonstration;Verbalized understanding           PT Short Term Goals - 11/11/16 1454      PT SHORT TERM GOAL #1   Title independent with initial HEP   Time 4   Period Weeks   Status Achieved     PT SHORT TERM GOAL #2   Title understand how to toilet correctly to relax pelvic floor instead of contracting   Time 4   Period Weeks   Status Achieved     PT SHORT TERM GOAL #3   Title pain with bowel movement decreased >/= 25% due to reduction of trigger points in the rectum   Time 4   Period Weeks   Status On-going           PT Long Term Goals - 10/21/16 1723      PT LONG TERM GOAL #1   Title independent with HEP   Time 4   Period Months   Status New     PT LONG TERM GOAL #2   Title ability to have a bowel movement with pain decreased >/= 75%   Time 4   Period Weeks   Status New     PT LONG TERM GOAL #3   Title ability to have a bowel movement without straining due to ability to push stool out.    Time 4   Period Months   Status New     PT LONG TERM GOAL #4   Title abdominal strength is 3/5 with ability to contract and not push outward    Time 4   Period Months   Status New     PT LONG TERM GOAL #5   Title FOTO score is </=35% limitation   Time 4   Period Months   Status New     Additional Long Term Goals   Additional Long Term Goals Yes     PT LONG TERM GOAL #6   Title when patient has the urge to have a bowel movement she is able to wait 10 minutes    Time 4   Period Months   Status New               Plan - 11/11/16 1527    Clinical Impression Statement Patient is doing her HEP.  Patient understands how to relax pelvic floor for a bowel movement.  Patient is doing pelvic floor meditation.  Patient had a colonoscopy that showed she has Chrons.  Patient continues to have pain.  Patient will benefit from skilled therapy to reduce rectal pain with defecation and improve coordiantion of the pelvic floor muscles.    Rehab Potential Excellent   Clinical Impairments Affecting Rehab Potential  None   PT Frequency 1x / week   PT Duration Other (comment)  4 months   PT Treatment/Interventions Biofeedback;Cryotherapy;Electrical Stimulation;Moist Heat;Therapeutic activities;Therapeutic exercise;Neuromuscular re-education;Patient/family education;Manual  techniques   PT Next Visit Plan soft tissue work anally, soft tissue work to have abdominals come together, abdominal bracing; correct pelvis   PT Home Exercise Plan progress as needed   Consulted and Agree with Plan of Care Patient      Patient will benefit from skilled therapeutic intervention in order to improve the following deficits and impairments:  Pain, Decreased coordination, Increased fascial restricitons, Decreased strength, Decreased activity tolerance  Visit Diagnosis: Muscle weakness (generalized)  Unspecified lack of coordination  Other muscle spasm     Problem List Patient Active Problem List   Diagnosis Date Noted  . Dyssynergic constipation 10/01/2016  . Constipation due to outlet dysfunction   . Anemia, iron deficiency 08/25/2016  . Prolapsed internal hemorrhoids, grade 3 02/27/2016  . Interstitial cystitis 10/19/2012  . Migraine headache 10/19/2012  . Irritable bowel syndrome 09/11/2010    Earlie Counts, PT 11/11/16 3:30 PM   [facility] [address], [place], [place], [address] Phone: [phone]   Fax:  [phone]  Name: Shelly Wilkinson MRN: [id number] Date of Birth: 1970-12-31  PHYSICAL THERAPY DISCHARGE SUMMARY  Visits from Start of Care: 3  Current functional level related to goals / functional outcomes: See above.  Patient called today to reports she wanted to be discharged due to a medical issue.    Remaining deficits: See above.    Education / Equipment: HEP Plan: Patient agrees to discharge.  Patient goals were not met. Patient is being discharged due to the patient's request. Thank you for the referral. Earlie Counts, PT 12/01/16 5:03 PM   ?????

## 2016-11-11 NOTE — Patient Instructions (Signed)
  1. Position yourself as shown, grabbing onto the feet or behind the knees; you should feel a gentle stretch.  2. Breathe in and allow the pelvic floor muscles to relax.  3. Hold this position for 30 seconds    1. While lying on your back with the left leg crossed over the right, bring both knees toward your chest.   2. Place your hands on the underside of your right leg and gently pull towards you until you feel a deep stretch. This should not be painful.  3. Hold this position for 30 seconds, breathing deeply. Repeat on the opposite side.    Lay Supine with one leg hanging off the table, loop a band or belt around the hanging leg and wrap it around the shoulder of the same side. Give a gentle tug on that strap until you feel a stretch in the hip flexor and Rectus Femoris. Ensure the lower back is flat on that table and that the opposite leg is bent. Hold 30 sec. 2 times each leg.    Prone Press ups ([employer])  Lie flat on your stomach with your arms in push up position. Press up to the point of restriction and down again. Repeat.5x 1 time per day.   While in a crawl position, slowly lower your buttocks towards your feet until a stretch is felt along your back and or buttocks. Hold 30 sec 2 times.  [place] [address], [place], [place] [address] Phone # [phone] Fax [phone]

## 2016-11-13 ENCOUNTER — Encounter: Payer: Self-pay | Admitting: Internal Medicine

## 2016-11-13 NOTE — Progress Notes (Signed)
My chart note to patient 5 year colon recall

## 2016-11-18 ENCOUNTER — Ambulatory Visit (INDEPENDENT_AMBULATORY_CARE_PROVIDER_SITE_OTHER): Payer: BLUE CROSS/BLUE SHIELD | Admitting: Internal Medicine

## 2016-11-18 ENCOUNTER — Other Ambulatory Visit (INDEPENDENT_AMBULATORY_CARE_PROVIDER_SITE_OTHER): Payer: BLUE CROSS/BLUE SHIELD

## 2016-11-18 ENCOUNTER — Encounter: Payer: Self-pay | Admitting: Internal Medicine

## 2016-11-18 VITALS — BP 80/50 | HR 84 | Ht 62.0 in | Wt 114.8 lb

## 2016-11-18 DIAGNOSIS — K51519 Left sided colitis with unspecified complications: Secondary | ICD-10-CM | POA: Insufficient documentation

## 2016-11-18 DIAGNOSIS — K523 Indeterminate colitis: Secondary | ICD-10-CM

## 2016-11-18 DIAGNOSIS — D500 Iron deficiency anemia secondary to blood loss (chronic): Secondary | ICD-10-CM | POA: Diagnosis not present

## 2016-11-18 DIAGNOSIS — K642 Third degree hemorrhoids: Secondary | ICD-10-CM | POA: Diagnosis not present

## 2016-11-18 DIAGNOSIS — D509 Iron deficiency anemia, unspecified: Secondary | ICD-10-CM | POA: Diagnosis not present

## 2016-11-18 HISTORY — DX: Indeterminate colitis: K52.3

## 2016-11-18 HISTORY — DX: Left sided colitis with unspecified complications: K51.519

## 2016-11-18 LAB — CBC WITH DIFFERENTIAL/PLATELET
Basophils Absolute: 0 10*3/uL (ref 0.0–0.1)
Basophils Relative: 1.1 % (ref 0.0–3.0)
Eosinophils Absolute: 0.3 10*3/uL (ref 0.0–0.7)
Eosinophils Relative: 6.3 % — ABNORMAL HIGH (ref 0.0–5.0)
HCT: 36.6 % (ref 36.0–46.0)
Hemoglobin: 12.4 g/dL (ref 12.0–15.0)
Lymphocytes Relative: 47.8 % — ABNORMAL HIGH (ref 12.0–46.0)
Lymphs Abs: 2 10*3/uL (ref 0.7–4.0)
MCHC: 33.8 g/dL (ref 30.0–36.0)
MCV: 88.6 fl (ref 78.0–100.0)
Monocytes Absolute: 0.6 10*3/uL (ref 0.1–1.0)
Monocytes Relative: 13.1 % — ABNORMAL HIGH (ref 3.0–12.0)
Neutro Abs: 1.3 10*3/uL — ABNORMAL LOW (ref 1.4–7.7)
Neutrophils Relative %: 31.7 % — ABNORMAL LOW (ref 43.0–77.0)
Platelets: 218 10*3/uL (ref 150.0–400.0)
RBC: 4.13 Mil/uL (ref 3.87–5.11)
RDW: 18.5 % — ABNORMAL HIGH (ref 11.5–15.5)
WBC: 4.2 10*3/uL (ref 4.0–10.5)

## 2016-11-18 LAB — FERRITIN: Ferritin: 174.9 ng/mL (ref 10.0–291.0)

## 2016-11-18 LAB — C-REACTIVE PROTEIN: CRP: 0.1 mg/dL — ABNORMAL LOW (ref 0.5–20.0)

## 2016-11-18 LAB — VITAMIN D 25 HYDROXY (VIT D DEFICIENCY, FRACTURES): VITD: 25.65 ng/mL — ABNORMAL LOW (ref 30.00–100.00)

## 2016-11-18 MED ORDER — MESALAMINE 1.2 G PO TBEC: 2.4000 g | DELAYED_RELEASE_TABLET | Freq: Two times a day (BID) | ORAL | 3 refills | Status: DC

## 2016-11-18 NOTE — Patient Instructions (Signed)
   Your physician has requested that you go to the basement for the following lab work before leaving today: Ferritin, CBC/diff, C-Reactive Protein, Vitamin D level  We have sent the following medications to your pharmacy for you to pick up at your convenience: Lialda   Follow up with Dr Carlean Purl in 3 months.   I appreciate the opportunity to care for you. Silvano Rusk, MD, [facility]

## 2016-11-18 NOTE — Progress Notes (Signed)
Shelly Wilkinson 45 y.o. [birth year] [id number]  Assessment & Plan:   Encounter Diagnoses  Name Primary?  . Indeterminate colitis Yes  . Iron deficiency anemia due to chronic blood loss   . Prolapsed internal hemorrhoids, grade 3    Phenotype is consistent with Crohn's disease in the left colon though previous serology studies earlier this year indicated no signs of IBD. Those are completely accurate.  Investigations today are:  CBC, ferritin CRP Vit D level  Start treatment with: Lialda 2.4 g bid if this works, would probably back off to 2.4 g daily to see she can tolerate that once remission achieved. Potential side effects of this medication are reviewed. One thing to keep in mind, she had chronic active colitis in the rectal biopsies which were more severe and focal active colitis in the left colon, so a prolapsed like situation could potentially cause this but it really is atypical for that. Hopefully she will respond to the [place]. If not we'll need to keep alternative diagnoses in mind.  RTC 3 mos sooner if needed I appreciate the opportunity to care for this patient. CC: Osborne Casco, MD    Subjective:   Chief Complaint: New diagnosis of colitis  HPI The patient is here after she had a colonoscopy and last couple of weeks, because of new or recurrent iron deficiency anemia in the setting of what we thought were bleeding hemorrhoids and IBS. She had scattered aphthous ulcerations and distal colon. Biopsies were consistent with chronic active colitis. Continues with diarrhea and rectal bleeding. Starting feel a bit tired again. His pursuing physical therapy, pelvic floor treatment, admits she has not been doing exercises as much as she should. Duloxetine continues to help with her stress/anxiety and abdominal pain cramping issues. She is happy with this. She continues to teach third grade. Allergies  Allergen Reactions  . Sulfa Antibiotics Hives, Nausea And  Vomiting and Other (See Comments)    And fever   Outpatient Medications Prior to Visit  Medication Sig Dispense Refill  . ACZONE 5 % topical gel Apply topically daily.     Marland Kitchen BOTOX 100 UNITS SOLR injection     . dihydroergotamine (MIGRANAL) 4 MG/ML nasal spray Place 1 spray into the nose as needed.     . DULoxetine (CYMBALTA) 60 MG capsule Take 1 capsule (60 mg total) by mouth daily. 30 capsule 3  . gabapentin (NEURONTIN) 300 MG capsule Take 300 mg by mouth 2 (two) times daily. Take 345m in the morning and 6050mat night    . ibuprofen (ADVIL,MOTRIN) 200 MG tablet Take 200 mg by mouth every 6 (six) hours as needed.    . Marland Kitchenevothyroxine (SYNTHROID, LEVOTHROID) 88 MCG tablet Take 88 mcg by mouth daily before breakfast.    . metoCLOPramide (REGLAN) 10 MG tablet Take 10 mg by mouth as needed.     . pentosan polysulfate (ELMIRON) 100 MG capsule Take 200 mg by mouth 2 (two) times daily.     . tazarotene (TAZORAC) 0.05 % cream Apply topically at bedtime.    . vitamin B-12 (CYANOCOBALAMIN) 1000 MCG tablet Take 1 tablet (1,000 mcg total) by mouth daily.    . Marland KitchenLPRAZolam (XANAX) 0.5 MG tablet Take 1/2 tablet prior to procedure or travel.  Repeat in one hour if necessary. 10 tablet 0  . Calcium & Magnesium Carbonates (MYLANTA PO) Take by mouth as needed.    . Cholecalciferol (VITAMIN D3) 2000 UNITS TABS Take 1 capsule by mouth daily.    .Marland Kitchen  ferrous sulfate 325 (65 FE) MG tablet Take 1 tablet (325 mg total) by mouth 2 (two) times daily with a meal. (Patient not taking: Reported on 11/06/2016)  3  . lidocaine (XYLOCAINE) 5 % ointment Apply 1 application topically as needed. 35.44 g 1  . PEPPERMINT OIL PO Take 1 tablet by mouth 2 (two) times daily.     Facility-Administered Medications Prior to Visit  Medication Dose Route Frequency Provider Last Rate Last Dose  . 0.9 %  sodium chloride infusion  500 mL Intravenous Continuous Gatha Mayer, MD       Past Medical History:  Diagnosis Date  . Anemia, iron  deficiency 08/25/2016  . Arthritis   . Chronic anal fissure   . Complication of anesthesia    headaches/   06-22-2014 surg. had urinary retention  . Dyssynergic constipation 10/01/2016   Anorectal mano  . GERD (gastroesophageal reflux disease)   . Hyperlipidemia   . Hypothyroidism   . IBS (irritable bowel syndrome)   . IC (interstitial cystitis)   . Indeterminate colitis 11/18/2016  . Migraines   . Wears glasses    Past Surgical History:  Procedure Laterality Date  . ANAL RECTAL MANOMETRY N/A 09/17/2016   Procedure: ANO RECTAL MANOMETRY;  Surgeon: Gatha Mayer, MD;  Location: [facility];  Service: Endoscopy;  Laterality: N/A;  . CHEILECTOMY Right 12/15/2013   Procedure: [place];  Surgeon: Wylene Simmer, MD;  Location: [place];  Service: Orthopedics;  Laterality: Right;  . CHEILECTOMY Left 02-12-2006   great toe  . COLONOSCOPY  10-02-2010  . CYSTO/  HYDRODISTENTION/  INSTILLATION THERAPY  03-19-2010  . D & C HYSTEROSCOPY /  HYDROTHERMAL ENDOMETRIAL ABLATION/  LEEP  05-29-2011  . DILATION AND EVACUATION  2002  . EVALUATION UNDER ANESTHESIA WITH ANAL FISTULECTOMY N/A 12/26/2014   Procedure: EXAM UNDER ANESTHESIA ;  Surgeon: Leighton Ruff, MD;  Location: [facility];  Service: General;  Laterality: N/A;  . HEMORRHOID BANDING  02-16-2014  . REPAIR LACERATED RADIAL DIGITAL NERVE LEFT RING FINGER  08-25-2005  . SPHINCTEROTOMY N/A 12/26/2014   Procedure: CHEMICAL SPHINCTEROTOMY (BOTOX);  Surgeon: Leighton Ruff, MD;  Location: [facility];  Service: General;  Laterality: N/A;  . TRANSANAL HEMORRHOIDAL DEARTERIALIZATION  06-22-2014   Social History   Social History  . Marital status: Married    Spouse name: N/A  . Number of children: 2  . Years of education: N/A   Occupational History  . tutor [place] History Main Topics  . Smoking status: Never Smoker  . Smokeless tobacco: Never Used  .  Alcohol use Yes     Comment: rarely  . Drug use: No  . Sexual activity: Yes    Partners: Male    Birth control/ protection: Surgical     Comment: ablation   Other Topics Concern  . None   Social History Narrative   Married, 3 children   Third grade teacher   Family History  Problem Relation Age of Onset  . Diabetes Father   . Heart disease Father   . Heart attack Father   . Hypertension Father   . Colitis Father   . Colon cancer Maternal Aunt   . Lung cancer Paternal Grandfather   . Heart attack Paternal Grandmother   . Rheum arthritis Maternal Grandmother        Review of Systems As above.  Objective:   Physical Exam BP (!) 80/50  Pulse 84   Ht 5' 2"  (1.575 m)   Wt 114 lb 12.8 oz (52.1 kg)   BMI 21.00 kg/m  No acute distress  ,15 minutes time spent with patient > half in counseling coordination of care

## 2016-11-19 ENCOUNTER — Encounter: Payer: Self-pay | Admitting: Internal Medicine

## 2016-11-19 ENCOUNTER — Other Ambulatory Visit: Payer: Self-pay | Admitting: Internal Medicine

## 2016-11-19 DIAGNOSIS — E559 Vitamin D deficiency, unspecified: Secondary | ICD-10-CM

## 2016-11-19 HISTORY — DX: Vitamin D deficiency, unspecified: E55.9

## 2016-11-19 MED ORDER — VITAMIN D (ERGOCALCIFEROL) 1.25 MG (50000 UNIT) PO CAPS: 50000.0000 [IU] | ORAL_CAPSULE | ORAL | 0 refills | Status: DC

## 2016-11-19 NOTE — Progress Notes (Signed)
CBC, iron levels NL Low vit D Low CRP (inflammation marker) Plan vit D supps 50 k weekly x 8 weeks My Chart note

## 2016-11-25 ENCOUNTER — Encounter: Payer: BLUE CROSS/BLUE SHIELD | Admitting: Physical Therapy

## 2016-11-27 ENCOUNTER — Telehealth: Payer: Self-pay | Admitting: Internal Medicine

## 2016-11-27 NOTE — Telephone Encounter (Signed)
patient states that she is having protruding and bleeding hemorrhoids. she has been sitting on ice and sitting in epsom salt baths. She says that she is also using preperation H. pt states that she doesn't get great cell service in her building. She is requesting that we hold on for a few seconds when we call so she can step outside to get better reception.

## 2016-11-27 NOTE — Telephone Encounter (Signed)
Patient what she feels is large protruding hemorrhoids.  She reports that she is bleeding through pads multiple times a day.  She is having terrible rectal pain at times.  She reports that when she applies ice her pain improved until last night.  She felt like the protrusion "went back in", then the pain was worse.  She tried a anusol suppository last night that caused more pain.  She reports that the protrusion is out again.  I put her on to be seen by you tomorrow at 10:15,  Dr. Carlean Purl does anything need to be done until tomorrow.  I advised to avoid any further suppositories at this time. She is ok to continue topical ice and baths.  Dr. Carlean Purl please advise

## 2016-11-27 NOTE — Telephone Encounter (Signed)
Patient notified

## 2016-11-27 NOTE — Telephone Encounter (Signed)
I will see her tomorrow and then will decide what is next If she cannot tolerate things otherwise before that ED would be next

## 2016-11-28 ENCOUNTER — Encounter: Payer: Self-pay | Admitting: Internal Medicine

## 2016-11-28 ENCOUNTER — Ambulatory Visit (INDEPENDENT_AMBULATORY_CARE_PROVIDER_SITE_OTHER): Payer: BLUE CROSS/BLUE SHIELD | Admitting: Internal Medicine

## 2016-11-28 VITALS — BP 80/60 | HR 76 | Ht 61.81 in | Wt 116.0 lb

## 2016-11-28 DIAGNOSIS — K523 Indeterminate colitis: Secondary | ICD-10-CM

## 2016-11-28 DIAGNOSIS — K645 Perianal venous thrombosis: Secondary | ICD-10-CM | POA: Diagnosis not present

## 2016-11-28 NOTE — Patient Instructions (Addendum)
   Keep using your Lidocaine.  You may use ice for pain and use sitz baths for comfort.  Handout provided.    You have been scheduled for an appointment with Dr Leighton Ruff at [facility]. Your appointment is on 12/01/16 at 10:10AM. Please arrive at 9:45AM for registration. Make certain to bring a list of current medications, including any over the counter medications or vitamins. Also bring your co-pay if you have one as well as your insurance cards. [facility] is located at [address], [address]. Should you need to reschedule your appointment, please contact them at [phone].     I appreciate the opportunity to care for you. Silvano Rusk, MD, [facility]

## 2016-11-28 NOTE — Progress Notes (Signed)
Shelly Wilkinson 45 y.o. [birth year] [id number]  Assessment & Plan:   Encounter Diagnoses  Name Primary?  . Thrombosed hemorrhoids Yes  . Indeterminate colitis     She is suffering from thrombosed hemorrhoids at this time, probably internal. Could be an external component. I think she is improved. Dr. Marcello Moores has kindly agreed to see her next week, I don't think there is anything urgent but a longer term plan will be needed I suspect. She has had a recurrence after one hemorrhoid procedure already, and now she has a diagnosis of indeterminate colitis, possibly Crohn's disease based upon the distribution of ulcers though not entirely sure. We did discuss the possibility of serologic testing understanding Crohn's versus ulcerative colitis though I don't think that'll affect medical management at this point. Will keep her scheduled follow-up with me for her other issues.  I have advised warm soaks, sitz baths. Ice for acute pain is okay but hopefully the warm treatments will facilitate clot dissolution.  I appreciate the opportunity to care for this patient. CC: Osborne Casco, MD Dr. Leighton Ruff   Subjective:   Chief Complaint: Rectal pain and protrusion HPI 5 days ago started having rectal swelling and pain. Fairly intense dealt with the use and topical lidocaine. Conservative measures. Then overnight recently had terrible pain upon reducing her protruding hemorrhoids. Ice had helped the pain some. One night of sleep disruption with that. She tried and hemorrhoidal suppository as well with that and that made things worse as well. Today she is a bit better but she is having a seepage from the rectum, some bleeding though minor it think. No change in bowel habits at this point on the Lialda. Overall she reports that her GI symptoms are globally improved as previously mentioned in last visit. Medications, allergies, past medical history, past surgical history, family history and  social history are reviewed and updated in the EMR.   Current Outpatient Prescriptions:  .  ACZONE 5 % topical gel, Apply topically daily. , Disp: , Rfl:  .  BOTOX 100 UNITS SOLR injection, , Disp: , Rfl:  .  dihydroergotamine (MIGRANAL) 4 MG/ML nasal spray, Place 1 spray into the nose as needed. , Disp: , Rfl:  .  DULoxetine (CYMBALTA) 60 MG capsule, Take 1 capsule (60 mg total) by mouth daily., Disp: 30 capsule, Rfl: 3 .  gabapentin (NEURONTIN) 300 MG capsule, Take 300 mg by mouth 2 (two) times daily. Take 329m in the morning and 6069mat night, Disp: , Rfl:  .  ibuprofen (ADVIL,MOTRIN) 200 MG tablet, Take 200 mg by mouth every 6 (six) hours as needed., Disp: , Rfl:  .  levothyroxine (SYNTHROID, LEVOTHROID) 88 MCG tablet, Take 88 mcg by mouth daily before breakfast., Disp: , Rfl:  .  mesalamine (LIALDA) 1.2 g EC tablet, Take 2 tablets (2.4 g total) by mouth 2 (two) times daily., Disp: 120 tablet, Rfl: 3 .  metoCLOPramide (REGLAN) 10 MG tablet, Take 10 mg by mouth as needed. , Disp: , Rfl:  .  pentosan polysulfate (ELMIRON) 100 MG capsule, Take 200 mg by mouth 2 (two) times daily. , Disp: , Rfl:  .  tazarotene (TAZORAC) 0.05 % cream, Apply topically at bedtime., Disp: , Rfl:  .  vitamin B-12 (CYANOCOBALAMIN) 1000 MCG tablet, Take 1 tablet (1,000 mcg total) by mouth daily., Disp: , Rfl:  .  Vitamin D, Ergocalciferol, (DRISDOL) 50000 units CAPS capsule, Take 1 capsule (50,000 Units total) by mouth every 7 (seven) days., Disp: 8  capsule, Rfl: 0  Review of Systems As above  Objective:   Physical Exam BP (!) 80/60 (BP Location: Left Arm, Patient Position: Sitting, Cuff Size: Normal)   Pulse 76   Ht 5' 1.81" (1.57 m)   Wt 116 lb (52.6 kg)   BMI 21.35 kg/m  NAD Patti [place], CMA present.  Rectal examination reveals fleshy anal tags and protruding prolapsed thrombosed hemorrhoids on the right. These are moderately tender and they are reducible. I think that is probably the right anterior  and posterior columns both involved especially the right anterior. No perianal fluctuance masses or signs of fistula, obvious fissure or infection.

## 2016-12-01 ENCOUNTER — Other Ambulatory Visit: Payer: Self-pay | Admitting: General Surgery

## 2016-12-01 DIAGNOSIS — K642 Third degree hemorrhoids: Secondary | ICD-10-CM | POA: Diagnosis not present

## 2016-12-02 ENCOUNTER — Encounter: Payer: BLUE CROSS/BLUE SHIELD | Admitting: Physical Therapy

## 2016-12-09 ENCOUNTER — Encounter: Payer: BLUE CROSS/BLUE SHIELD | Admitting: Physical Therapy

## 2016-12-10 DIAGNOSIS — G43719 Chronic migraine without aura, intractable, without status migrainosus: Secondary | ICD-10-CM | POA: Diagnosis not present

## 2016-12-16 ENCOUNTER — Encounter: Payer: BLUE CROSS/BLUE SHIELD | Admitting: Physical Therapy

## 2017-01-16 ENCOUNTER — Telehealth: Payer: Self-pay | Admitting: Internal Medicine

## 2017-01-16 MED ORDER — DULOXETINE HCL 60 MG PO CPEP: 60.0000 mg | ORAL_CAPSULE | Freq: Every day | ORAL | 0 refills | Status: DC

## 2017-01-16 NOTE — Telephone Encounter (Signed)
Patient aware that cymbalta refilled.

## 2017-02-03 ENCOUNTER — Encounter: Payer: Self-pay | Admitting: Physical Therapy

## 2017-02-03 DIAGNOSIS — J069 Acute upper respiratory infection, unspecified: Secondary | ICD-10-CM | POA: Diagnosis not present

## 2017-02-04 ENCOUNTER — Telehealth: Payer: Self-pay | Admitting: Internal Medicine

## 2017-02-04 DIAGNOSIS — R198 Other specified symptoms and signs involving the digestive system and abdomen: Secondary | ICD-10-CM

## 2017-02-04 NOTE — Telephone Encounter (Signed)
I left a message for the [facility], that I placed referral in EPIC and to call me if she has any questions.

## 2017-02-05 ENCOUNTER — Encounter: Payer: Self-pay | Admitting: Physical Therapy

## 2017-02-05 ENCOUNTER — Ambulatory Visit: Payer: BLUE CROSS/BLUE SHIELD | Attending: Internal Medicine | Admitting: Physical Therapy

## 2017-02-05 DIAGNOSIS — R279 Unspecified lack of coordination: Secondary | ICD-10-CM | POA: Diagnosis present

## 2017-02-05 DIAGNOSIS — M62838 Other muscle spasm: Secondary | ICD-10-CM | POA: Diagnosis present

## 2017-02-05 DIAGNOSIS — M6281 Muscle weakness (generalized): Secondary | ICD-10-CM

## 2017-02-05 NOTE — Patient Instructions (Signed)
Toileting Techniques for Bowel Movements (Defecation) Using your belly (abdomen) and pelvic floor muscles to have a bowel movement is usually instinctive.  Sometimes people can have problems with these muscles and have to relearn proper defecation (emptying) techniques.  If you have weakness in your muscles, organs that are falling out, decreased sensation in your pelvis, or ignore your urge to go, you may find yourself straining to have a bowel movement.  You are straining if you are: . holding your breath or taking in a huge gulp of air and holding it  . keeping your lips and jaw tensed and closed tightly . turning red in the face because of excessive pushing or forcing . developing or worsening your  hemorrhoids . getting faint while pushing . not emptying completely and have to defecate many times a day  If you are straining, you are actually making it harder for yourself to have a bowel movement.  Many people find they are pulling up with the pelvic floor muscles and closing off instead of opening the anus. Due to lack pelvic floor relaxation and coordination the abdominal muscles, one has to work harder to push the feces out.  Many people have never been taught how to defecate efficiently and effectively.  Notice what happens to your body when you are having a bowel movement.  While you are sitting on the toilet pay attention to the following areas: . Jaw and mouth position . Angle of your hips   . Whether your feet touch the ground or not . Arm placement  . Spine position . Waist . Belly tension . Anus (opening of the anal canal)  An Evacuation/Defecation Plan   Here are the 4 basic points:  1. Lean forward enough for your elbows to rest on your knees 2. Support your feet on the floor or use a low stool if your feet don't touch the floor  3. Push out your belly as if you have swallowed a beach ball-you should feel a widening of your waist 4. Open and relax your pelvic floor muscles,  rather than tightening around the anus      The following conditions my require modifications to your toileting posture:  . If you have had surgery in the past that limits your back, hip, pelvic, knee or ankle flexibility . Constipation   Your healthcare practitioner may make the following additional suggestions and adjustments:  1) Sit on the toilet  a) Make sure your feet are supported. b) Notice your hip angle and spine position-most people find it effective to lean forward or raise their knees, which can help the muscles around the anus to relax  c) When you lean forward, place your forearms on your thighs for support  2) Relax suggestions a) Breath deeply in through your nose and out slowly through your mouth as if you are smelling the flowers and blowing out the candles. b) To become aware of how to relax your muscles, contracting and releasing muscles can be helpful.  Pull your pelvic floor muscles in tightly by using the image of holding back gas, or closing around the anus (visualize making a circle smaller) and lifting the anus up and in.  Then release the muscles and your anus should drop down and feel open. Repeat 5 times ending with the feeling of relaxation. c) Keep your pelvic floor muscles relaxed; let your belly bulge out. d) The digestive tract starts at the mouth and ends at the anal opening, so be  sure to relax both ends of the tube.  Place your tongue on the roof of your mouth with your teeth separated.  This helps relax your mouth and will help to relax the anus at the same time.  3) Empty (defecation) a) Keep your pelvic floor and sphincter relaxed, then bulge your anal muscles.  Make the anal opening wide.  b) Stick your belly out as if you have swallowed a beach ball. c) Make your belly wall hard using your belly muscles while continuing to breathe. Doing this makes it easier to open your anus. d) Breath out and give a grunt (or try using other sounds such as  ahhhh, shhhhh, ohhhh or grrrrrrr).  4) Finish a) As you finish your bowel movement, pull the pelvic floor muscles up and in.  This will leave your anus in the proper place rather than remaining pushed out and down. If you leave your anus pushed out and down, it will start to feel as though that is normal and give you incorrect signals about needing to have a bowel movement.    Earlie Counts, PT [facility] [place] [address] [place], [place] [address] About Abdominal Massage  Abdominal massage, also called external colon massage, is a self-treatment circular massage technique that can reduce and eliminate gas and ease constipation. The colon naturally contracts in waves in a clockwise direction starting from inside the right hip, moving up toward the ribs, across the belly, and down inside the left hip.  When you perform circular abdominal massage, you help stimulate your colon's normal wave pattern of movement called peristalsis.  It is most beneficial when done after eating.  Positioning You can practice abdominal massage with oil while lying down, or in the shower with soap.  Some people find that it is just as effective to do the massage through clothing while sitting or standing.  How to Massage Start by placing your finger tips or knuckles on your right side, just inside your hip bone.  . Make small circular movements while you move upward toward your rib cage.   . Once you reach the bottom right side of your rib cage, take your circular movements across to the left side of the bottom of your rib cage.  . Next, move downward until you reach the inside of your left hip bone.  This is the path your feces travel in your colon. . Continue to perform your abdominal massage in this pattern for 10 minutes each day.     You can apply as much pressure as is comfortable in your massage.  Start gently and build pressure as you continue to practice.  Notice any areas of  pain as you massage; areas of slight pain may be relieved as you massage, but if you have areas of significant or intense pain, consult with your healthcare provider.  Other Considerations . General physical activity including bending and stretching can have a beneficial massage-like effect on the colon.  Deep breathing can also stimulate the colon because breathing deeply activates the same nervous system that supplies the colon.   . Abdominal massage should always be used in combination with a bowel-conscious diet that is high in the proper type of fiber for you, fluids (primarily water), and a regular exercise program. [facility] [address] [place], [place] [address] Phone # [phone] Fax [phone]

## 2017-02-05 NOTE — Therapy (Signed)
[facility] [address], [place], [place], [address] Phone: [phone]   Fax:  [phone]  Physical Therapy Evaluation  Patient Details  Name: Shelly Wilkinson MRN: [id number] Date of Birth: [birth year] Referring Provider: Dr. Silvano Rusk  Encounter Date: 02/05/2017      PT End of Session - 02/05/17 1524    Visit Number 1   Date for PT Re-Evaluation 03/05/17   PT Start Time 1445   PT Stop Time 1525   PT Time Calculation (min) 40 min   Activity Tolerance Patient tolerated treatment well   Behavior During Therapy [facility] for tasks assessed/performed      Past Medical History:  Diagnosis Date  . Anemia, iron deficiency 08/25/2016  . Arthritis   . Chronic anal fissure   . Complication of anesthesia    headaches/   06-22-2014 surg. had urinary retention  . Dyssynergic constipation 10/01/2016   Anorectal mano  . GERD (gastroesophageal reflux disease)   . Hyperlipidemia   . Hypothyroidism   . IBS (irritable bowel syndrome)   . IC (interstitial cystitis)   . Indeterminate colitis 11/18/2016  . Migraines   . Vitamin D deficiency 11/19/2016  . Wears glasses     Past Surgical History:  Procedure Laterality Date  . ANAL RECTAL MANOMETRY N/A 09/17/2016   Procedure: ANO RECTAL MANOMETRY;  Surgeon: Gatha Mayer, MD;  Location: [facility];  Service: Endoscopy;  Laterality: N/A;  . CHEILECTOMY Right 12/15/2013   Procedure: [place];  Surgeon: Wylene Simmer, MD;  Location: [facility];  Service: Orthopedics;  Laterality: Right;  . CHEILECTOMY Left 02-12-2006   great toe  . COLONOSCOPY  10-02-2010  . CYSTO/  HYDRODISTENTION/  INSTILLATION THERAPY  03-19-2010  . D & C HYSTEROSCOPY /  HYDROTHERMAL ENDOMETRIAL ABLATION/  LEEP  05-29-2011  . DILATION AND EVACUATION  2002  . EVALUATION UNDER ANESTHESIA WITH ANAL FISTULECTOMY N/A 12/26/2014   Procedure: EXAM UNDER ANESTHESIA ;  Surgeon: Leighton Ruff, MD;  Location: [facility];  Service: General;  Laterality: N/A;  . HEMORRHOID BANDING  02-16-2014  . REPAIR LACERATED RADIAL DIGITAL NERVE LEFT RING FINGER  08-25-2005  . SPHINCTEROTOMY N/A 12/26/2014   Procedure: CHEMICAL SPHINCTEROTOMY (BOTOX);  Surgeon: Leighton Ruff, MD;  Location: [facility];  Service: General;  Laterality: N/A;  . TRANSANAL HEMORRHOIDAL DEARTERIALIZATION  06-22-2014    There were no vitals filed for this visit.       Subjective Assessment - 02/05/17 1449    Subjective Patient stopped due to a thrombosed hemmorroid.  I saw Dr. Marcello Moores and will need surgery.  I will need hemorroidectomy. Has 6 times per week. Rarely strain to have a bowel movement.  Usually has type 4 bowel movements.    Patient Stated Goals better bowel habits   Currently in Pain? Yes   Pain Score 4    Pain Location Abdomen   Pain Orientation Lower   Pain Type Chronic pain   Pain Onset More than a month ago   Pain Frequency Intermittent   Aggravating Factors  gas   Pain Relieving Factors no gas   Multiple Pain Sites No            [facility] PT Assessment - 02/05/17 0001      Assessment   Medical Diagnosis R19.8 Abnormal defecation   Referring Provider Dr. Silvano Rusk   Onset Date/Surgical Date 01/06/14   Prior Therapy none  Precautions   Precautions None     Restrictions   Weight Bearing Restrictions No     Balance Screen   Has the patient fallen in the past 6 months No   Has the patient had a decrease in activity level because of a fear of falling?  No   Is the patient reluctant to leave their home because of a fear of falling?  No     Home Ecologist residence     Prior Function   Level of Independence Independent   Vocation Part time employment   Vocation Requirements standing   Leisure walking     Cognition   Overall Cognitive Status Within Functional Limits for tasks assessed      Observation/Other Assessments   Focus on Therapeutic Outcomes (FOTO)  17% limitation     AROM   Overall AROM Comments full     Strength   Overall Strength Comments abdominal 4/5     Palpation   SI assessment  pelvis in correct alignment   Palpation comment tightness in lower abdominal area                 Pelvic Floor Special Questions - 02/05/17 0001    Diastasis Recti no diastasis   Is this Painful No   Urinary Leakage No   Fecal incontinence No   Exam Type Deferred  due to recent thrombosed hemorriod                  PT Education - 02/05/17 1524    Education provided Yes   Education Details abdominal massage; toileting technique   Person(s) Educated Patient   Methods Explanation;Demonstration;Verbal cues;Handout   Comprehension Returned demonstration;Verbalized understanding          PT Short Term Goals - 11/11/16 1454      PT SHORT TERM GOAL #1   Title independent with initial HEP   Time 4   Period Weeks   Status Achieved     PT SHORT TERM GOAL #2   Title understand how to toilet correctly to relax pelvic floor instead of contracting   Time 4   Period Weeks   Status Achieved     PT SHORT TERM GOAL #3   Title pain with bowel movement decreased >/= 25% due to reduction of trigger points in the rectum   Time 4   Period Weeks   Status On-going           PT Long Term Goals - 02/05/17 1530      PT LONG TERM GOAL #1   Title independent with HEP   Time 4   Period Weeks   Status New     PT LONG TERM GOAL #2   Title FOTO score </=10% limitation   Time 4   Period Weeks   Status New     PT LONG TERM GOAL #3   Title abdominal strength 5/5 making it easier to push a bowel movement to prevent future hemorroids   Time 4   Period Weeks   Status New     PT LONG TERM GOAL #4   Title --     PT LONG TERM GOAL #5   Title --     PT LONG TERM GOAL #6   Title --               Plan - 02/05/17 1525    Clinical Impression  Statement Patient is returning to therapy  after having a thrombosed hemorroid. She will be having a hemorroidectomy in the summer.  She want to make sure she has good bowel health and prevent furture hemorroid. Abdominal strength is 4/5.  Patient has tightness located in lower abdominal.  Pelvic floor was not assessed due to recent issues with hemorroid. Patient reports 6 bowel movement per week with type 4 bowels.  Patient reports she is barely straining to have a bowel movement. Patient is low compled evaluation due to an stable condition and one comorbidity that includes history of hemrroids. Patient will benefit from phsysical therapy to improve core strength and pelvic strength to prevent future hemorroids.    Rehab Potential Excellent   PT Frequency 1x / week   PT Duration 4 weeks   PT Treatment/Interventions Therapeutic exercise;Therapeutic activities;Neuromuscular re-education;Patient/family education;Manual techniques   PT Next Visit Plan abdominal soft tissue work; abdominal strength   PT Home Exercise Plan progress as needed   Consulted and Agree with Plan of Care Patient      Patient will benefit from skilled therapeutic intervention in order to improve the following deficits and impairments:  Decreased strength  Visit Diagnosis: Muscle weakness (generalized) - Plan: PT plan of care cert/re-cert  Unspecified lack of coordination - Plan: PT plan of care cert/re-cert     Problem List Patient Active Problem List   Diagnosis Date Noted  . Vitamin D deficiency 11/19/2016  . Indeterminate colitis 11/18/2016  . Dyssynergic constipation 10/01/2016  . Constipation due to outlet dysfunction   . Anemia, iron deficiency 08/25/2016  . Prolapsed internal hemorrhoids, grade 3 02/27/2016  . Interstitial cystitis 10/19/2012  . Migraine headache 10/19/2012  . Irritable bowel syndrome 09/11/2010    Earlie Counts, PT 02/05/17 3:34 PM   [facility] [address], [place], [place], [address] Phone: [phone]   Fax:  [phone]  Name: Shelly Wilkinson MRN: [id number] Date of Birth: [birth year]

## 2017-02-11 ENCOUNTER — Ambulatory Visit: Payer: BLUE CROSS/BLUE SHIELD | Admitting: Physical Therapy

## 2017-02-11 ENCOUNTER — Encounter: Payer: Self-pay | Admitting: Physical Therapy

## 2017-02-11 DIAGNOSIS — M6281 Muscle weakness (generalized): Secondary | ICD-10-CM

## 2017-02-11 DIAGNOSIS — R279 Unspecified lack of coordination: Secondary | ICD-10-CM

## 2017-02-11 DIAGNOSIS — M62838 Other muscle spasm: Secondary | ICD-10-CM

## 2017-02-11 NOTE — Patient Instructions (Addendum)
Bracing With Curl-Up (Hook-Lying)    With neutral spine, tighten pelvic floor and abdominals and hold. Squeeze ball between knees. Lift head and shoulders not past shoulder blade. Breathe out as you come up. Repeat _15__ times. Do __1_ times a day. Keep chin to ceiling.    Copyright  VHI. All rights reserved.    Bracing With Leg March (Hook-Lying)    With neutral spine, tighten pelvic floor and abdominals and hold. Alternating legs, lift foot _12__ inches and return to floor. Repeat __15_ times. Do __1_ times a day.   Copyright  VHI. All rights reserved.    Bracing With Diagonal Curl-Up (Hook-Lying)    With neutral spine, tighten pelvic floor and abdominals and hold. Lift head and shoulder and rotate to one side. Repeat _15__ times, alternating sides. Do __1_ times a day. Alternate arm positions: Fold arms across chest. Support neck with clasped hands.   Copyright  VHI. All rights reserved.   [place] [address], [place], [place] [address] Phone # [phone] Fax [phone]

## 2017-02-11 NOTE — Therapy (Signed)
[facility] [address], [place], [place], [address] Phone: [phone]   Fax:  [phone]  Physical Therapy Treatment  Patient Details  Name: Shelly Wilkinson MRN: [id number] Date of Birth: 1971/08/26 Referring Provider: Dr. Silvano Rusk  Encounter Date: 02/11/2017      PT End of Session - 02/11/17 1454    Visit Number 2   Date for PT Re-Evaluation 03/05/17   PT Start Time 1445   PT Stop Time 1525   PT Time Calculation (min) 40 min   Activity Tolerance Patient tolerated treatment well   Behavior During Therapy [facility] for tasks assessed/performed      Past Medical History:  Diagnosis Date  . Anemia, iron deficiency 08/25/2016  . Arthritis   . Chronic anal fissure   . Complication of anesthesia    headaches/   06-22-2014 surg. had urinary retention  . Dyssynergic constipation 10/01/2016   Anorectal mano  . GERD (gastroesophageal reflux disease)   . Hyperlipidemia   . Hypothyroidism   . IBS (irritable bowel syndrome)   . IC (interstitial cystitis)   . Indeterminate colitis 11/18/2016  . Migraines   . Vitamin D deficiency 11/19/2016  . Wears glasses     Past Surgical History:  Procedure Laterality Date  . ANAL RECTAL MANOMETRY N/A 09/17/2016   Procedure: ANO RECTAL MANOMETRY;  Surgeon: Gatha Mayer, MD;  Location: [facility];  Service: Endoscopy;  Laterality: N/A;  . CHEILECTOMY Right 12/15/2013   Procedure: [place];  Surgeon: Wylene Simmer, MD;  Location: [facility];  Service: Orthopedics;  Laterality: Right;  . CHEILECTOMY Left 02-12-2006   great toe  . COLONOSCOPY  10-02-2010  . CYSTO/  HYDRODISTENTION/  INSTILLATION THERAPY  03-19-2010  . D & C HYSTEROSCOPY /  HYDROTHERMAL ENDOMETRIAL ABLATION/  LEEP  05-29-2011  . DILATION AND EVACUATION  2002  . EVALUATION UNDER ANESTHESIA WITH ANAL FISTULECTOMY N/A 12/26/2014   Procedure: EXAM UNDER ANESTHESIA ;  Surgeon: Leighton Ruff, MD;  Location: [facility];  Service: General;  Laterality: N/A;  . HEMORRHOID BANDING  02-16-2014  . REPAIR LACERATED RADIAL DIGITAL NERVE LEFT RING FINGER  08-25-2005  . SPHINCTEROTOMY N/A 12/26/2014   Procedure: CHEMICAL SPHINCTEROTOMY (BOTOX);  Surgeon: Leighton Ruff, MD;  Location: [facility];  Service: General;  Laterality: N/A;  . TRANSANAL HEMORRHOIDAL DEARTERIALIZATION  06-22-2014    There were no vitals filed for this visit.      Subjective Assessment - 02/11/17 1453    Subjective I am eating oatmeal.    Patient Stated Goals better bowel habits   Currently in Pain? No/denies                         [facility] Adult PT Treatment/Exercise - 02/11/17 0001      Manual Therapy   Manual Therapy Soft tissue mobilization   Soft tissue mobilization circular abdominal massage to promote peristalic movement;    Myofascial Release release of back fascia, lateral abdominal fascia;  transverse abdominus                PT Education - 02/11/17 1525    Education provided Yes   Education Details abdominal strengthening   Person(s) Educated Patient   Methods Explanation;Demonstration   Comprehension Verbalized understanding;Returned demonstration          PT Short Term Goals - 11/11/16 1454      PT SHORT TERM GOAL #  1   Title independent with initial HEP   Time 4   Period Weeks   Status Achieved     PT SHORT TERM GOAL #2   Title understand how to toilet correctly to relax pelvic floor instead of contracting   Time 4   Period Weeks   Status Achieved     PT SHORT TERM GOAL #3   Title pain with bowel movement decreased >/= 25% due to reduction of trigger points in the rectum   Time 4   Period Weeks   Status On-going           PT Long Term Goals - 02/11/17 1529      PT LONG TERM GOAL #1   Title independent with HEP   Time 4   Period Weeks   Status On-going     PT LONG TERM GOAL #2   Title FOTO score  </=10% limitation   Time 4   Period Weeks   Status New     PT LONG TERM GOAL #3   Title abdominal strength 5/5 making it easier to push a bowel movement to prevent future hemorroids   Time 4   Period Weeks   Status On-going               Plan - 02/11/17 1526    Clinical Impression Statement Patient was able to contract her abdomen better after the soft tissue work due to decreased fascia tightness and can bring the 2 sides together. Patient is not able to so a full abdominal curl up due to abdominal weakness. Patient just started therapy so she has just learned exericises.  She continues to have regular bowel movements.    Rehab Potential Excellent   Clinical Impairments Affecting Rehab Potential None   PT Frequency 1x / week   PT Duration 4 weeks   PT Treatment/Interventions Therapeutic exercise;Therapeutic activities;Neuromuscular re-education;Patient/family education;Manual techniques   PT Next Visit Plan ; abdominal strength; march with toe tapping mat,    PT Home Exercise Plan progress as needed   Consulted and Agree with Plan of Care Patient      Patient will benefit from skilled therapeutic intervention in order to improve the following deficits and impairments:  Decreased strength  Visit Diagnosis: Muscle weakness (generalized)  Unspecified lack of coordination  Other muscle spasm     Problem List Patient Active Problem List   Diagnosis Date Noted  . Vitamin D deficiency 11/19/2016  . Indeterminate colitis 11/18/2016  . Dyssynergic constipation 10/01/2016  . Constipation due to outlet dysfunction   . Anemia, iron deficiency 08/25/2016  . Prolapsed internal hemorrhoids, grade 3 02/27/2016  . Interstitial cystitis 10/19/2012  . Migraine headache 10/19/2012  . Irritable bowel syndrome 09/11/2010    Earlie Counts, PT 02/11/17 3:30 PM    [facility] [address], [place], [place],  [address] Phone: [phone]   Fax:  [phone]  Name: Shelly Wilkinson MRN: [id number] Date of Birth: [birth year]

## 2017-02-13 ENCOUNTER — Ambulatory Visit (INDEPENDENT_AMBULATORY_CARE_PROVIDER_SITE_OTHER): Payer: BLUE CROSS/BLUE SHIELD | Admitting: Internal Medicine

## 2017-02-13 ENCOUNTER — Encounter: Payer: Self-pay | Admitting: Internal Medicine

## 2017-02-13 DIAGNOSIS — K642 Third degree hemorrhoids: Secondary | ICD-10-CM | POA: Diagnosis not present

## 2017-02-13 DIAGNOSIS — K523 Indeterminate colitis: Secondary | ICD-10-CM

## 2017-02-13 DIAGNOSIS — K580 Irritable bowel syndrome with diarrhea: Secondary | ICD-10-CM

## 2017-02-13 MED ORDER — BUDESONIDE 9 MG PO TB24: 9.0000 mg | ORAL_TABLET | Freq: Every day | ORAL | 1 refills | Status: DC

## 2017-02-13 NOTE — Assessment & Plan Note (Addendum)
?   Part of the problem Refill duloxetine

## 2017-02-13 NOTE — Progress Notes (Signed)
   Shelly Wilkinson 46 y.o. February 18, 1971 [id number]  Assessment & Plan:   Indeterminate colitis Better Add Uceris RTC 2 mos ? Other imaging, could need repeat sigmoidoscopy, could be small bowel imaging with capsule depending upon how she doesn't what we need to figure out as to the extent of disease. At this point if she improves on the Uceris will probably stop that and see how long her response she gets versus considering adding immunosuppressant or biologic. We discussed some of that today.   Irritable bowel syndrome ? Part of the problem Refill duloxetine  Prolapsed internal hemorrhoids, grade 3 Will have surgery better  I appreciate the opportunity to care for this patient. CC: Shelly Casco, MD  Leighton Ruff M.D.  Subjective:   Chief Complaint: Follow-up of colitis and hemorrhoids  HPI She is here for follow-up having been seen back in December. She had thrombosed internal hemorrhoids then which are better, she saw Dr. Marcello Moores and the plan is to have surgery this summer. As far as her indeterminate colitis she is improved on L[place]da but still has episodic diarrhea. Also having gas. Much less abdominal pain. Stress level and IBS symptoms are better on duloxetine. Like she could have a bit more weight where she is. Weighs less than she did in high school. Not clear if there are specific food intolerances though sometimes when she eats [place] yogurt she can have problems with gas and diarrhea.  Medications, allergies, past medical history, past surgical history, family history and social history are reviewed and updated in the EMR.  Review of Systems As above  Objective:   Physical Exam BP (!) 94/56   Pulse 66   Ht 5' 1"  (1.549 m)   Wt 112 lb (50.8 kg)   BMI 21.16 kg/m  Eyes anicteric Abdomen is soft and nontender Appropriate mood and affect Alert and oriented 3

## 2017-02-13 NOTE — Patient Instructions (Addendum)
   Today we are giving you samples of VSL #3, take one daily.    We have sent the following medications to your pharmacy for you to pick up at your convenience: Duloxetine, uceris    Follow up with Dr Carlean Purl in 2 months.     I appreciate the opportunity to care for you. Silvano Rusk, MD, [facility]

## 2017-02-13 NOTE — Assessment & Plan Note (Addendum)
Better Add Uceris RTC 2 mos ? Other imaging, could need repeat sigmoidoscopy, could be small bowel imaging with capsule depending upon how she doesn't what we need to figure out as to the extent of disease. At this point if she improves on the Uceris will probably stop that and see how long her response she gets versus considering adding immunosuppressant or biologic. We discussed some of that today.

## 2017-02-13 NOTE — Assessment & Plan Note (Signed)
Will have surgery better

## 2017-02-15 ENCOUNTER — Other Ambulatory Visit: Payer: Self-pay | Admitting: Internal Medicine

## 2017-02-16 ENCOUNTER — Encounter: Payer: Self-pay | Admitting: Physical Therapy

## 2017-02-16 ENCOUNTER — Ambulatory Visit: Payer: BLUE CROSS/BLUE SHIELD | Admitting: Physical Therapy

## 2017-02-16 DIAGNOSIS — M6281 Muscle weakness (generalized): Secondary | ICD-10-CM | POA: Diagnosis not present

## 2017-02-16 DIAGNOSIS — R279 Unspecified lack of coordination: Secondary | ICD-10-CM

## 2017-02-16 DIAGNOSIS — M62838 Other muscle spasm: Secondary | ICD-10-CM

## 2017-02-16 NOTE — Therapy (Signed)
[facility] [address] [place], [place], [address] Phone: [phone]   Fax:  [phone]  Physical Therapy Treatment  Patient Details  Name: Shelly Wilkinson MRN: [id number] Date of Birth: [birth year] Referring Provider: Dr. Silvano Rusk  Encounter Date: 02/16/2017      PT End of Session - 02/16/17 1005    Visit Number 3   Date for PT Re-Evaluation 03/05/17   PT Start Time 0930   PT Stop Time 1003   PT Time Calculation (min) 33 min   Activity Tolerance Patient tolerated treatment well   Behavior During Therapy [facility] for tasks assessed/performed      Past Medical History:  Diagnosis Date  . Anemia, iron deficiency 08/25/2016  . Arthritis   . Chronic anal fissure   . Complication of anesthesia    headaches/   06-22-2014 surg. had urinary retention  . Dyssynergic constipation 10/01/2016   Anorectal mano  . GERD (gastroesophageal reflux disease)   . Hyperlipidemia   . Hypothyroidism   . IBS (irritable bowel syndrome)   . IC (interstitial cystitis)   . Indeterminate colitis 11/18/2016  . Migraines   . Vitamin D deficiency 11/19/2016  . Wears glasses     Past Surgical History:  Procedure Laterality Date  . ANAL RECTAL MANOMETRY N/A 09/17/2016   Procedure: ANO RECTAL MANOMETRY;  Surgeon: Gatha Mayer, MD;  Location: [facility];  Service: Endoscopy;  Laterality: N/A;  . CHEILECTOMY Right 12/15/2013   Procedure: [place];  Surgeon: Wylene Simmer, MD;  Location: [facility];  Service: Orthopedics;  Laterality: Right;  . CHEILECTOMY Left 02-12-2006   great toe  . COLONOSCOPY  10-02-2010  . CYSTO/  HYDRODISTENTION/  INSTILLATION THERAPY  03-19-2010  . D & C HYSTEROSCOPY /  HYDROTHERMAL ENDOMETRIAL ABLATION/  LEEP  05-29-2011  . DILATION AND EVACUATION  2002  . EVALUATION UNDER ANESTHESIA WITH ANAL FISTULECTOMY N/A 12/26/2014   Procedure: EXAM UNDER ANESTHESIA ;  Surgeon: Leighton Ruff, MD;  Location: [facility];  Service: General;  Laterality: N/A;  . HEMORRHOID BANDING  02-16-2014  . REPAIR LACERATED RADIAL DIGITAL NERVE LEFT RING FINGER  08-25-2005  . SPHINCTEROTOMY N/A 12/26/2014   Procedure: CHEMICAL SPHINCTEROTOMY (BOTOX);  Surgeon: Leighton Ruff, MD;  Location: [facility];  Service: General;  Laterality: N/A;  . TRANSANAL HEMORRHOIDAL DEARTERIALIZATION  06-22-2014    There were no vitals filed for this visit.      Subjective Assessment - 02/16/17 0933    Subjective I am doing better but I still have stomach issues.  I saw Dr. Carlean Purl and he feels like I am not in remission. I have a prescribed probiotic. It is helping with the gas.     Patient Stated Goals better bowel habits   Currently in Pain? No/denies            [facility] PT Assessment - 02/16/17 0001      Assessment   Medical Diagnosis R19.8 Abnormal defecation   Referring Provider Dr. Silvano Rusk   Onset Date/Surgical Date 01/06/14   Prior Therapy none     Precautions   Precautions None     Restrictions   Weight Bearing Restrictions No     Balance Screen   Has the patient fallen in the past 6 months No   Has the patient had a decrease in activity level because of a fear of falling?  No   Is the patient reluctant to  leave their home because of a fear of falling?  No     Home Ecologist residence     Prior Function   Level of Independence Independent   Vocation Part time employment   Vocation Requirements standing   Leisure walking     Cognition   Overall Cognitive Status Within Functional Limits for tasks assessed     Observation/Other Assessments   Focus on Therapeutic Outcomes (FOTO)  4% limitation     AROM   Overall AROM Comments full     Strength   Overall Strength Comments abdominal 5/5     Palpation   SI assessment  pelvis in correct alignment   Palpation comment tightness in lower abdominal area      Transfers   Transfers Not assessed     Ambulation/Gait   Ambulation/Gait No                             PT Education - 02/16/17 1000    Education provided Yes   Education Details progressed abdominal strength; reviewed HEP   Person(s) Educated Patient   Methods Explanation;Demonstration;Verbal cues;Handout   Comprehension Returned demonstration;Verbalized understanding          PT Short Term Goals - 11/11/16 1454      PT SHORT TERM GOAL #1   Title independent with initial HEP   Time 4   Period Weeks   Status Achieved     PT SHORT TERM GOAL #2   Title understand how to toilet correctly to relax pelvic floor instead of contracting   Time 4   Period Weeks   Status Achieved     PT SHORT TERM GOAL #3   Title pain with bowel movement decreased >/= 25% due to reduction of trigger points in the rectum   Time 4   Period Weeks   Status On-going           PT Long Term Goals - 02/16/17 0941      PT LONG TERM GOAL #1   Title independent with HEP   Time 4   Period Weeks   Status Achieved     PT LONG TERM GOAL #2   Title FOTO score </=10% limitation   Time 4   Period Weeks   Status Achieved     PT LONG TERM GOAL #3   Title abdominal strength 5/5 making it easier to push a bowel movement to prevent future hemorroids   Time 4   Period Weeks   Status Achieved               Plan - 02/16/17 1006    Clinical Impression Statement Patient has met all of her goals.  Patient is independent with her HEP for abdominal strengthening.  Patient is able to have bowel movements regularly.  Patient is taking a probiotic flro gas pain.  Patient abdominal strength increased to 5/5.  Patient FOTO score decreased to 4% limitation. Patient is ready for discharge.    Rehab Potential Excellent   Clinical Impairments Affecting Rehab Potential None   PT Treatment/Interventions Therapeutic exercise;Therapeutic activities;Neuromuscular  re-education;Patient/family education;Manual techniques   PT Next Visit Plan Discharge to HEP   PT Home Exercise Plan Current HEP   Consulted and Agree with Plan of Care Patient      Patient will benefit from skilled therapeutic intervention in order to improve the following deficits and impairments:  Decreased strength  Visit Diagnosis: Muscle weakness (generalized)  Unspecified lack of coordination  Other muscle spasm     Problem List Patient Active Problem List   Diagnosis Date Noted  . Vitamin D deficiency 11/19/2016  . Indeterminate colitis 11/18/2016  . Dyssynergic constipation 10/01/2016  . Constipation due to outlet dysfunction   . Anemia, iron deficiency 08/25/2016  . Prolapsed internal hemorrhoids, grade 3 02/27/2016  . Interstitial cystitis 10/19/2012  . Migraine headache 10/19/2012  . Irritable bowel syndrome 09/11/2010    Earlie Counts, PT 02/16/17 10:10 AM   [facility] [address] [place], [place], [address] Phone: [phone]   Fax:  [phone]  Name: Shelly Wilkinson MRN: [id number] Date of Birth: 07-08-1971  PHYSICAL THERAPY DISCHARGE SUMMARY  Visits from Start of Care: 3  Current functional level related to goals / functional outcomes: See above.    Remaining deficits: See above.    Education / Equipment: HEP Plan: Patient agrees to discharge.  Patient goals were met. Patient is being discharged due to meeting the stated rehab goals. Thank you for the referral. Earlie Counts, PT 02/16/17 10:10 AM   ?????

## 2017-02-16 NOTE — Telephone Encounter (Signed)
Refill # 90 w/ 3 RF

## 2017-02-16 NOTE — Telephone Encounter (Signed)
How many refills Sir?

## 2017-02-16 NOTE — Patient Instructions (Addendum)
Inside Leg Circle    Lie on side, back straight along edge of mat, legs 30 in front of torso. Bend top leg, foot in front of lower thigh. May hold ankle to maintain position. Lift bottom leg, foot pointed. Rotate in small circle, __10__ times in each direction. Repeat ___1_ times. Repeat on other side. Do __1__ sessions per day.  http://pm.exer.us/64   Copyright  VHI. All rights reserved.  Side Leg Circle    Lie on side, back straight along edge of mat, legs 30 in front of torso. Lift top leg to hip height. Rotate in small circle, __10__ times in each direction. Repeat _1___ times. Repeat on other side. Do __1__ sessions per day.  http://pm.exer.us/60   Copyright  VHI. All rights reserved.  Saw    Sit up straight, legs open slightly wider than hips. Extend arms to side, flex feet. Inhale, twisting to side. Exhale, rounding spine over leg, reach opposite hand toward outside of foot, other arm back, palm up. Repeat _10___ times, alternating sides. Do __1__ sessions per day.  http://pm.exer.us/36   Copyright  VHI. All rights reserved.  Corkscrew    Lie on back, legs straight up, slightly turned out, heels touching, hands under lower hips. Inhale, reaching legs out to one side. Exhale, circling with legs. Maintain legs above 45. Avoid arching spine off mat. Repeat _5___ times, alternating direction. Do _1___ sessions per day.  http://pm.exer.us/34   Copyright  VHI. All rights reserved.  Straight Leg Open / Close    Lie on back, legs straight up, hands under lower hips, head down. Inhale, opening legs to side. Exhale, closing legs. Repeat __10__ times. Do _1___ sessions per day. NOTE: Keep navel to spine, back flat.  http://pm.exer.us/24   Copyright  VHI. All rights reserved.  [facility] [address] [place], [place] [address] Phone # [phone] Fax [phone]

## 2017-02-25 ENCOUNTER — Other Ambulatory Visit: Payer: Self-pay | Admitting: Obstetrics & Gynecology

## 2017-02-25 ENCOUNTER — Encounter: Payer: Self-pay | Admitting: Internal Medicine

## 2017-02-25 DIAGNOSIS — Z1231 Encounter for screening mammogram for malignant neoplasm of breast: Secondary | ICD-10-CM

## 2017-02-25 MED ORDER — VSL#3 PO PACK
PACK | ORAL | 6 refills | Status: DC
Start: 2017-02-25 — End: 2017-09-16

## 2017-03-12 ENCOUNTER — Other Ambulatory Visit: Payer: Self-pay | Admitting: Internal Medicine

## 2017-03-12 DIAGNOSIS — K523 Indeterminate colitis: Secondary | ICD-10-CM

## 2017-03-12 DIAGNOSIS — G43719 Chronic migraine without aura, intractable, without status migrainosus: Secondary | ICD-10-CM | POA: Diagnosis not present

## 2017-03-23 ENCOUNTER — Ambulatory Visit: Payer: BLUE CROSS/BLUE SHIELD

## 2017-03-27 ENCOUNTER — Ambulatory Visit: Payer: BLUE CROSS/BLUE SHIELD

## 2017-04-03 ENCOUNTER — Ambulatory Visit
Admission: RE | Admit: 2017-04-03 | Discharge: 2017-04-03 | Disposition: A | Discharge status: Home / Self Care | Payer: BLUE CROSS/BLUE SHIELD | Source: Ambulatory Visit | Attending: Obstetrics & Gynecology | Admitting: Obstetrics & Gynecology

## 2017-04-03 DIAGNOSIS — Z1231 Encounter for screening mammogram for malignant neoplasm of breast: Secondary | ICD-10-CM | POA: Diagnosis not present

## 2017-04-03 DIAGNOSIS — R350 Frequency of micturition: Secondary | ICD-10-CM | POA: Diagnosis not present

## 2017-04-03 DIAGNOSIS — N3010 Interstitial cystitis (chronic) without hematuria: Secondary | ICD-10-CM | POA: Diagnosis not present

## 2017-04-06 ENCOUNTER — Other Ambulatory Visit: Payer: Self-pay | Admitting: Obstetrics & Gynecology

## 2017-04-06 DIAGNOSIS — R928 Other abnormal and inconclusive findings on diagnostic imaging of breast: Secondary | ICD-10-CM

## 2017-04-09 ENCOUNTER — Ambulatory Visit
Admission: RE | Admit: 2017-04-09 | Discharge: 2017-04-09 | Disposition: A | Discharge status: Home / Self Care | Payer: BLUE CROSS/BLUE SHIELD | Source: Ambulatory Visit | Attending: Obstetrics & Gynecology | Admitting: Obstetrics & Gynecology

## 2017-04-09 DIAGNOSIS — N6489 Other specified disorders of breast: Secondary | ICD-10-CM | POA: Diagnosis not present

## 2017-04-09 DIAGNOSIS — R928 Other abnormal and inconclusive findings on diagnostic imaging of breast: Secondary | ICD-10-CM | POA: Diagnosis not present

## 2017-04-10 ENCOUNTER — Other Ambulatory Visit: Payer: Self-pay | Admitting: Internal Medicine

## 2017-04-17 ENCOUNTER — Other Ambulatory Visit (INDEPENDENT_AMBULATORY_CARE_PROVIDER_SITE_OTHER): Payer: BLUE CROSS/BLUE SHIELD

## 2017-04-17 ENCOUNTER — Ambulatory Visit (INDEPENDENT_AMBULATORY_CARE_PROVIDER_SITE_OTHER): Payer: BLUE CROSS/BLUE SHIELD | Admitting: Internal Medicine

## 2017-04-17 ENCOUNTER — Encounter: Payer: Self-pay | Admitting: Internal Medicine

## 2017-04-17 DIAGNOSIS — K51519 Left sided colitis with unspecified complications: Secondary | ICD-10-CM

## 2017-04-17 LAB — COMPREHENSIVE METABOLIC PANEL
ALT: 29 U/L (ref 0–35)
AST: 19 U/L (ref 0–37)
Albumin: 4.3 g/dL (ref 3.5–5.2)
Alkaline Phosphatase: 56 U/L (ref 39–117)
BUN: 8 mg/dL (ref 6–23)
CO2: 30 mEq/L (ref 19–32)
Calcium: 9.8 mg/dL (ref 8.4–10.5)
Chloride: 101 mEq/L (ref 96–112)
Creatinine, Ser: 0.6 mg/dL (ref 0.40–1.20)
GFR: 113.96 mL/min (ref >60.00)
Glucose, Bld: 117 mg/dL — ABNORMAL HIGH (ref 70–99)
Potassium: 3.5 mEq/L (ref 3.5–5.1)
Sodium: 136 mEq/L (ref 135–145)
Total Bilirubin: 1.2 mg/dL (ref 0.2–1.2)
Total Protein: 7.4 g/dL (ref 6.0–8.3)

## 2017-04-17 LAB — CBC WITH DIFFERENTIAL/PLATELET
Basophils Absolute: 0 10*3/uL (ref 0.0–0.1)
Basophils Relative: 0.7 % (ref 0.0–3.0)
Eosinophils Absolute: 0.1 10*3/uL (ref 0.0–0.7)
Eosinophils Relative: 0.9 % (ref 0.0–5.0)
HCT: 39.3 % (ref 36.0–46.0)
Hemoglobin: 13.3 g/dL (ref 12.0–15.0)
Lymphocytes Relative: 31.9 % (ref 12.0–46.0)
Lymphs Abs: 1.7 10*3/uL (ref 0.7–4.0)
MCHC: 33.8 g/dL (ref 30.0–36.0)
MCV: 92 fl (ref 78.0–100.0)
Monocytes Absolute: 0.6 10*3/uL (ref 0.1–1.0)
Monocytes Relative: 10.2 % (ref 3.0–12.0)
Neutro Abs: 3 10*3/uL (ref 1.4–7.7)
Neutrophils Relative %: 56.3 % (ref 43.0–77.0)
Platelets: 195 10*3/uL (ref 150.0–400.0)
RBC: 4.28 Mil/uL (ref 3.87–5.11)
RDW: 14.3 % (ref 11.5–15.5)
WBC: 5.4 10*3/uL (ref 4.0–10.5)

## 2017-04-17 MED ORDER — PREDNISONE 10 MG PO TABS: 40.0000 mg | ORAL_TABLET | Freq: Every day | ORAL | 1 refills | Status: DC

## 2017-04-17 NOTE — Progress Notes (Signed)
Shelly Wilkinson 46 y.o. 02/22/1971 [id number]  Assessment & Plan:   Encounter Diagnosis  Name Primary?  . Left sided ulcerative colitis with complication ([place])    She is flaring despite Uceris and Lialda DC both  Start Prednisone Loperamide prn C diff PCR CBC, CMEt Hep B serologies + quantiferon RTC 2 mos Anticipate Humira had risk and benefit discussion w/ her + gave her CCFA biologic handout Ca 1200 mg daily Effects of prednisone also discussed. Anticipate a 6-8 week course with this depending upon her clinical response. We'll advise taper once labs return. Should Be talking to her by next week.  I appreciate the opportunity to care for this patient. CC: Osborne Casco, MD   Subjective:   Chief Complaint: diarrhea, abdominal cramps  HPI The patient is here complaining of worsening symptoms of diarrhea and some abdominal soreness especially when she flexes her hips. Feels sore and painful but mild. There is some mild nausea at times. Hemorrhoids are under control at this time. She had been better initially with the Eucerin's prescription but in the past month or so she's been having watery stools, no incontinence but some urgency and it is waking her at night. There have been no recent antibiotics. 3 times a month has some heartburn. She's not eating well because of the diarrhea.  Wt Readings from Last 3 Encounters:  04/17/17 104 lb (47.2 kg)  02/13/17 112 lb (50.8 kg)  11/28/16 116 lb (52.6 kg)   Allergies  Allergen Reactions  . Sulfa Antibiotics Hives, Nausea And Vomiting and Other (See Comments)    And fever   Current Meds  Medication Sig  . ACZONE 5 % topical gel Apply topically daily.   Marland Kitchen BOTOX 100 UNITS SOLR injection   . cholecalciferol (VITAMIN D) 1000 units tablet Take 1,000 Units by mouth daily.  . Dietary Management Product (VSL#3) PACK Take 1 capsule by mouth daily  . dihydroergotamine (MIGRANAL) 4 MG/ML nasal spray Place 1 spray into the nose  as needed.   . DULoxetine (CYMBALTA) 60 MG capsule TAKE 1 CAPSULE(60 MG) BY MOUTH DAILY  . gabapentin (NEURONTIN) 300 MG capsule Take 300 mg by mouth 2 (two) times daily. Take 376m in the morning and 6070mat night  . ibuprofen (ADVIL,MOTRIN) 200 MG tablet Take 200 mg by mouth every 6 (six) hours as needed.  . Marland Kitchenevothyroxine (SYNTHROID, LEVOTHROID) 88 MCG tablet Take 88 mcg by mouth daily before breakfast.  . metoCLOPramide (REGLAN) 10 MG tablet Take 10 mg by mouth as needed.   . pentosan polysulfate (ELMIRON) 100 MG capsule Take 200 mg by mouth 2 (two) times daily.   . tazarotene (TAZORAC) 0.05 % cream Apply topically at bedtime.  . vitamin B-12 (CYANOCOBALAMIN) 1000 MCG tablet Take 1 tablet (1,000 mcg total) by mouth daily.  . [DISCONTINUED] mesalamine (LIALDA) 1.2 g EC tablet TAKE 2 TABLETS(2.4 GRAMS) BY MOUTH TWICE DAILY  . [DISCONTINUED] UCERIS 9 MG TB24 TAKE 1 TABLET BY MOUTH DAILY   Past Medical History:  Diagnosis Date  . Anemia, iron deficiency 08/25/2016  . Arthritis   . Chronic anal fissure   . Complication of anesthesia    headaches/   06-22-2014 surg. had urinary retention  . Dyssynergic constipation 10/01/2016   Anorectal mano  . GERD (gastroesophageal reflux disease)   . Hyperlipidemia   . Hypothyroidism   . IBS (irritable bowel syndrome)   . IC (interstitial cystitis)   . Indeterminate colitis 11/18/2016  . Migraines   . Thrombosed  hemorrhoids   . Vitamin D deficiency 11/19/2016  . Wears glasses    Past Surgical History:  Procedure Laterality Date  . ANAL RECTAL MANOMETRY N/A 09/17/2016   Procedure: ANO RECTAL MANOMETRY;  Surgeon: Gatha Mayer, MD;  [facility]: [facility];  Service: Endoscopy;  Laterality: N/A;  . CHEILECTOMY Right 12/15/2013   Procedure: [place];  Surgeon: Wylene Simmer, MD;  Location: [facility];  Service: Orthopedics;  Laterality: Right;  . CHEILECTOMY Left 02-12-2006   great toe  . COLONOSCOPY  10-02-2010  .  CYSTO/  HYDRODISTENTION/  INSTILLATION THERAPY  03-19-2010  . D & C HYSTEROSCOPY /  HYDROTHERMAL ENDOMETRIAL ABLATION/  LEEP  05-29-2011  . DILATION AND EVACUATION  2002  . EVALUATION UNDER ANESTHESIA WITH ANAL FISTULECTOMY N/A 12/26/2014   Procedure: EXAM UNDER ANESTHESIA ;  Surgeon: Leighton Ruff, MD;  Location: [facility];  Service: General;  Laterality: N/A;  . HEMORRHOID BANDING  02-16-2014  . REPAIR LACERATED RADIAL DIGITAL NERVE LEFT RING FINGER  08-25-2005  . SPHINCTEROTOMY N/A 12/26/2014   Procedure: CHEMICAL SPHINCTEROTOMY (BOTOX);  Surgeon: Leighton Ruff, MD;  Location: [facility];  Service: General;  Laterality: N/A;  . TRANSANAL HEMORRHOIDAL DEARTERIALIZATION  06-22-2014     Review of Systems As above  Objective:   Physical Exam BP (!) 90/58   Pulse 68   Ht 5' 1"  (1.549 m)   Wt 104 lb (47.2 kg)   BMI 19.65 kg/m  NAD Eyes are anicteric The abdomen is soft mildly tender in the lower quadrants sounds are present No cyanosis clubbing or edema noted in the extremities She is alert 3 and has appropriate mood and affect  25 minutes time spent with patient > half in counseling coordination of care

## 2017-04-17 NOTE — Assessment & Plan Note (Addendum)
worse or all. Has been indeterminate colitis, I'm going to categorize this is ulcerative colitis for medication purposes at this time. Details in the note from today but anticipate starting Humira if screening labs are appropriate, for now prednisone taper is started.

## 2017-04-17 NOTE — Patient Instructions (Addendum)
Your physician has requested that you go to the basement for lab work before leaving today.   Stop the uceris and Lialda per Dr Carlean Purl.   Take calcium 1275m daily.   Take Imodium as needed.   Follow up with Dr GCarlean Purlin 2 months.   We have sent the following medications to your pharmacy for you to pick up at your convenience: Prednisone    I appreciate the opportunity to care for you. CSilvano Rusk MD, [facility]

## 2017-04-18 LAB — QUANTIFERON TB GOLD ASSAY (BLOOD)
Interferon Gamma Release Assay: NEGATIVE
Mitogen-Nil: >10 IU/mL
Quantiferon Nil Value: 0.03 IU/mL
Quantiferon Tb Ag Minus Nil Value: 0 IU/mL

## 2017-04-18 LAB — HEPATITIS B SURFACE ANTIGEN: Hepatitis B Surface Ag: NEGATIVE

## 2017-04-18 LAB — HEPATITIS B CORE ANTIBODY, TOTAL: Hep B Core Total Ab: NONREACTIVE

## 2017-04-18 LAB — HEPATITIS B SURFACE ANTIBODY,QUALITATIVE: Hep B S Ab: NEGATIVE

## 2017-04-20 ENCOUNTER — Other Ambulatory Visit: Payer: BLUE CROSS/BLUE SHIELD

## 2017-04-20 DIAGNOSIS — K51519 Left sided colitis with unspecified complications: Secondary | ICD-10-CM | POA: Diagnosis not present

## 2017-04-21 LAB — CLOSTRIDIUM DIFFICILE BY PCR: Toxigenic C. Difficile by PCR: NOT DETECTED

## 2017-04-21 NOTE — Progress Notes (Signed)
Labs ok C diff was negative Please start Humira Rx starter pack then 40 mg every 2 weeks Also need an update on sxs on current dose of prednisone

## 2017-04-22 ENCOUNTER — Other Ambulatory Visit: Payer: Self-pay

## 2017-04-22 MED ORDER — ADALIMUMAB 40 MG/0.8ML ~~LOC~~ AJKT: 160.0000 mg | AUTO-INJECTOR | Freq: Once | SUBCUTANEOUS | 0 refills | Status: DC

## 2017-04-22 MED ORDER — ADALIMUMAB 40 MG/0.8ML ~~LOC~~ AJKT: 40.0000 mg | AUTO-INJECTOR | SUBCUTANEOUS | 6 refills | Status: DC

## 2017-04-23 NOTE — Progress Notes (Signed)
My Chart message sent to patient re: stay on 40 mg and update me next week

## 2017-04-24 ENCOUNTER — Telehealth: Payer: Self-pay | Admitting: Internal Medicine

## 2017-04-24 NOTE — Telephone Encounter (Signed)
Called and gave [facility] pharmacy dx code for Humira.

## 2017-04-29 ENCOUNTER — Other Ambulatory Visit: Payer: Self-pay

## 2017-05-01 ENCOUNTER — Encounter: Payer: Self-pay | Admitting: Internal Medicine

## 2017-05-07 ENCOUNTER — Encounter: Payer: Self-pay | Admitting: Internal Medicine

## 2017-05-08 ENCOUNTER — Telehealth: Payer: Self-pay | Admitting: Internal Medicine

## 2017-05-08 NOTE — Telephone Encounter (Signed)
I left a detailed message for her that she should start Humira while on Prednisone and to call us with an update on her symptoms after she has had the 2nd Humira loading dose.

## 2017-05-11 ENCOUNTER — Other Ambulatory Visit: Payer: Self-pay | Admitting: Internal Medicine

## 2017-05-19 ENCOUNTER — Telehealth: Payer: Self-pay | Admitting: Internal Medicine

## 2017-05-19 DIAGNOSIS — S50819A Abrasion of unspecified forearm, initial encounter: Secondary | ICD-10-CM | POA: Diagnosis not present

## 2017-05-19 DIAGNOSIS — Z23 Encounter for immunization: Secondary | ICD-10-CM | POA: Diagnosis not present

## 2017-05-19 DIAGNOSIS — S60519A Abrasion of unspecified hand, initial encounter: Secondary | ICD-10-CM | POA: Diagnosis not present

## 2017-05-19 DIAGNOSIS — S0081XA Abrasion of other part of head, initial encounter: Secondary | ICD-10-CM | POA: Diagnosis not present

## 2017-05-19 NOTE — Telephone Encounter (Signed)
Patient notified that Tetnus should be fine.  She is advised the she can't have a live vaccine while on Humira.

## 2017-05-20 DIAGNOSIS — S82891A Other fracture of right lower leg, initial encounter for closed fracture: Secondary | ICD-10-CM | POA: Diagnosis not present

## 2017-05-21 ENCOUNTER — Encounter: Payer: Self-pay | Admitting: Internal Medicine

## 2017-05-21 ENCOUNTER — Other Ambulatory Visit: Payer: Self-pay | Admitting: Internal Medicine

## 2017-05-21 DIAGNOSIS — K219 Gastro-esophageal reflux disease without esophagitis: Secondary | ICD-10-CM

## 2017-05-21 MED ORDER — OMEPRAZOLE-SODIUM BICARBONATE 40-1680 MG PO PACK: 40.0000 mg | PACK | Freq: Every day | ORAL | 3 refills | Status: DC

## 2017-05-21 MED ORDER — METOCLOPRAMIDE HCL 10 MG PO TABS: 10.0000 mg | ORAL_TABLET | Freq: Every day | ORAL | 0 refills | Status: DC

## 2017-05-22 ENCOUNTER — Telehealth: Payer: Self-pay | Admitting: Internal Medicine

## 2017-05-22 DIAGNOSIS — E559 Vitamin D deficiency, unspecified: Secondary | ICD-10-CM | POA: Diagnosis not present

## 2017-05-22 DIAGNOSIS — E039 Hypothyroidism, unspecified: Secondary | ICD-10-CM | POA: Diagnosis not present

## 2017-05-22 MED ORDER — OMEPRAZOLE-SODIUM BICARBONATE 40-1680 MG PO PACK: 40.0000 mg | PACK | Freq: Every day | ORAL | 3 refills | Status: DC

## 2017-05-22 MED ORDER — METOCLOPRAMIDE HCL 10 MG PO TABS: 10.0000 mg | ORAL_TABLET | Freq: Every day | ORAL | 0 refills | Status: DC

## 2017-05-22 NOTE — Telephone Encounter (Signed)
rx sent again I left a message for her that new rx sent to the correct pharmacy

## 2017-05-29 DIAGNOSIS — R232 Flushing: Secondary | ICD-10-CM | POA: Diagnosis not present

## 2017-05-29 DIAGNOSIS — E039 Hypothyroidism, unspecified: Secondary | ICD-10-CM | POA: Diagnosis not present

## 2017-05-29 DIAGNOSIS — E559 Vitamin D deficiency, unspecified: Secondary | ICD-10-CM | POA: Diagnosis not present

## 2017-06-03 ENCOUNTER — Encounter: Payer: Self-pay | Admitting: Internal Medicine

## 2017-06-09 DIAGNOSIS — L905 Scar conditions and fibrosis of skin: Secondary | ICD-10-CM | POA: Diagnosis not present

## 2017-06-09 DIAGNOSIS — L821 Other seborrheic keratosis: Secondary | ICD-10-CM | POA: Diagnosis not present

## 2017-06-09 DIAGNOSIS — B078 Other viral warts: Secondary | ICD-10-CM | POA: Diagnosis not present

## 2017-06-10 DIAGNOSIS — G43719 Chronic migraine without aura, intractable, without status migrainosus: Secondary | ICD-10-CM | POA: Diagnosis not present

## 2017-06-16 ENCOUNTER — Ambulatory Visit (INDEPENDENT_AMBULATORY_CARE_PROVIDER_SITE_OTHER): Payer: BLUE CROSS/BLUE SHIELD | Admitting: Internal Medicine

## 2017-06-16 ENCOUNTER — Encounter: Payer: Self-pay | Admitting: Internal Medicine

## 2017-06-16 VITALS — BP 96/60 | HR 88 | Ht 61.0 in | Wt 111.8 lb

## 2017-06-16 DIAGNOSIS — Z7960 Long term (current) use of unspecified immunomodulators and immunosuppressants: Secondary | ICD-10-CM

## 2017-06-16 DIAGNOSIS — Z79899 Other long term (current) drug therapy: Secondary | ICD-10-CM | POA: Diagnosis not present

## 2017-06-16 DIAGNOSIS — E559 Vitamin D deficiency, unspecified: Secondary | ICD-10-CM | POA: Diagnosis not present

## 2017-06-16 DIAGNOSIS — K51519 Left sided colitis with unspecified complications: Secondary | ICD-10-CM | POA: Diagnosis not present

## 2017-06-16 MED ORDER — PREDNISONE 10 MG PO TABS: 10.0000 mg | ORAL_TABLET | Freq: Every day | ORAL | 1 refills | Status: DC

## 2017-06-16 MED ORDER — VITAMIN D (ERGOCALCIFEROL) 1.25 MG (50000 UNIT) PO CAPS: 50000.0000 [IU] | ORAL_CAPSULE | ORAL | 0 refills | Status: DC

## 2017-06-16 NOTE — Assessment & Plan Note (Signed)
She was rechecked and still has a low vitamin D. In light of the ankle fracture will pulse width 50,000 weekly for 12 doses.

## 2017-06-16 NOTE — Patient Instructions (Addendum)
  Normal BMI (Body Mass Index- based on height and weight) is between 19 and 25. Your BMI today is Body mass index is 21.12 kg/m. Marland Kitchen Please consider follow up  regarding your BMI with your Primary Care Provider.  Today we are giving you a handout to read on lactose intolerance.  Decrease your prednisone to 57m daily.  Message uKoreain 2 weeks with an update.   We have sent the following medications to your pharmacy for you to pick up at your convenience: Vitamin D to take weekly.   Follow up with Dr GCarlean Purlin 3 months.    I appreciate the opportunity to care for you. CSilvano Rusk MD, [facility]

## 2017-06-16 NOTE — Assessment & Plan Note (Signed)
Vaccinate when off prednisone

## 2017-06-16 NOTE — Assessment & Plan Note (Signed)
Improving on Humira and prednisone Will continue taper - go to 10 mg prednisone daily and contact me in 2 weeks See me in 3 mos

## 2017-06-16 NOTE — Progress Notes (Signed)
Shelly Wilkinson 46 y.o. [birth year] [id number]  Assessment & Plan:  Left sided ulcerative colitis (? Crohn's) with complication (HCC) Improving on Humira and prednisone Will continue taper - go to 10 mg prednisone daily and contact me in 2 weeks See me in 3 mos   Long-term use of immunosuppressant medication - Humira Vaccinate when off prednisone  Vitamin D deficiency She was rechecked and still has a low vitamin D. In light of the ankle fracture will pulse width 50,000 weekly for 12 doses.  I appreciate the opportunity to care for this patient. AJ:OINOMVE, Shelly Sheffield, MD Dr. Dagmar Hait Subjective:   Chief Complaint: follow-up colitis  HPI The patient is here for follow-up, last seen in April. At that time Lialda and Uceris were discontinued and she was started on prednisone. A few weeks after that she started Humira and she has had 3 dosings of that so far over the past 6 weeks. Redness own is been tapered down to 15 mg and overall much less diarrhea and urgency. She has gained some weight appetite and energy level are very good. She would like to taper the prednisone further. She was exercising with interval walking and running and apparently rolled and then fractured her ankle which is in a brace. She saw endocrinology recently and apparently her vitamin D level is still low, and he said continue vitamin D supplements. He thought that it was related to her malabsorption from her bowel disease.  Hemorrhoids are better though "I am very careful". She is aware she needs a colonoscopy prior to any hemorrhoid surgery. She is considering getting an abdominoplasty when she has hemorrhoid surgery in the future.  Reflux is under good control with current regimen, it flared with the prednisone. I added nocturnal Zegerid.  Wt Readings from Last 3 Encounters:  06/16/17 111 lb 12.8 oz (50.7 kg)  04/17/17 104 lb (47.2 kg)  02/13/17 112 lb (50.8 kg)    Allergies  Allergen Reactions  . Sulfa  Antibiotics Hives, Nausea And Vomiting and Other (See Comments)    And fever   Current Meds  Medication Sig  . Adalimumab (HUMIRA PEN) 40 MG/0.8ML PNKT Inject 40 mg into the skin every 14 (fourteen) days. After the completion of the starter kit  . BOTOX 100 UNITS SOLR injection   . cholecalciferol (VITAMIN D) 1000 units tablet Take 1,000 Units by mouth daily.  . Dietary Management Product (VSL#3) PACK Take 1 capsule by mouth daily  . dihydroergotamine (MIGRANAL) 4 MG/ML nasal spray Place 1 spray into the nose as needed.   . DULoxetine (CYMBALTA) 60 MG capsule TAKE 1 CAPSULE(60 MG) BY MOUTH DAILY  . gabapentin (NEURONTIN) 300 MG capsule Take 300 mg by mouth 2 (two) times daily. Take 357m in the morning and 6015mat night  . ibuprofen (ADVIL,MOTRIN) 200 MG tablet Take 200 mg by mouth every 6 (six) hours as needed.  . Marland Kitchenevothyroxine (SYNTHROID, LEVOTHROID) 88 MCG tablet Take 88 mcg by mouth daily before breakfast.  . metoCLOPramide (REGLAN) 10 MG tablet Take 1 tablet (10 mg total) by mouth at bedtime.  . Earney Navyicarbonate 40-1680 MG PACK Take 40 mg by mouth at bedtime.  . pentosan polysulfate (ELMIRON) 100 MG capsule Take 200 mg by mouth 2 (two) times daily.   . predniSONE (DELTASONE) 10 MG tablet Take 2 tablets (20 mg total) by mouth daily with breakfast. Will advise taper  . tazarotene (TAZORAC) 0.05 % cream Apply topically at bedtime.  . vitamin B-12 (CYANOCOBALAMIN) 1000  MCG tablet Take 1 tablet (1,000 mcg total) by mouth daily.   Past Medical History:  Diagnosis Date  . Anemia, iron deficiency 08/25/2016  . Arthritis   . Chronic anal fissure   . Complication of anesthesia    headaches/   06-22-2014 surg. had urinary retention  . Constipation due to outlet dysfunction   . Dyssynergic constipation 10/01/2016   Anorectal mano  . GERD (gastroesophageal reflux disease)   . Hyperlipidemia   . Hypothyroidism   . IBS (irritable bowel syndrome)   . IC (interstitial cystitis)     . Indeterminate colitis 11/18/2016  . Migraines   . Thrombosed hemorrhoids   . Vitamin D deficiency 11/19/2016  . Wears glasses    Past Surgical History:  Procedure Laterality Date  . ANAL RECTAL MANOMETRY N/A 09/17/2016   Procedure: ANO RECTAL MANOMETRY;  Surgeon: Gatha Mayer, MD;  [facility];  Service: Endoscopy;  Laterality: N/A;  . CHEILECTOMY Right 12/15/2013   Procedure: [place];  Surgeon: Wylene Simmer, MD;  Location: [facility];  Service: Orthopedics;  Laterality: Right;  . CHEILECTOMY Left 02-12-2006   great toe  . COLONOSCOPY  10-02-2010  . CYSTO/  HYDRODISTENTION/  INSTILLATION THERAPY  03-19-2010  . D & C HYSTEROSCOPY /  HYDROTHERMAL ENDOMETRIAL ABLATION/  LEEP  05-29-2011  . DILATION AND EVACUATION  2002  . EVALUATION UNDER ANESTHESIA WITH ANAL FISTULECTOMY N/A 12/26/2014   Procedure: EXAM UNDER ANESTHESIA ;  Surgeon: Leighton Ruff, MD;  [facility];  Service: General;  Laterality: N/A;  . HEMORRHOID BANDING  02-16-2014  . REPAIR LACERATED RADIAL DIGITAL NERVE LEFT RING FINGER  08-25-2005  . SPHINCTEROTOMY N/A 12/26/2014   Procedure: CHEMICAL SPHINCTEROTOMY (BOTOX);  Surgeon: Leighton Ruff, MD;  Location: [facility];  Service: General;  Laterality: N/A;  . TRANSANAL HEMORRHOIDAL DEARTERIALIZATION  06-22-2014      Review of Systems As above  Objective:   Physical Exam BP 96/60   Pulse 88   Ht 5' 1"  (1.549 m)   Wt 111 lb 12.8 oz (50.7 kg)   BMI 21.12 kg/m  No acute distress Lungs clear\ Normal heart sounds Abdomen is soft nontender no organomegaly or mass The right ankle is in a soft splint

## 2017-06-17 DIAGNOSIS — S82891D Other fracture of right lower leg, subsequent encounter for closed fracture with routine healing: Secondary | ICD-10-CM | POA: Diagnosis not present

## 2017-06-26 DIAGNOSIS — S82891S Other fracture of right lower leg, sequela: Secondary | ICD-10-CM | POA: Diagnosis not present

## 2017-06-29 DIAGNOSIS — S82891S Other fracture of right lower leg, sequela: Secondary | ICD-10-CM | POA: Diagnosis not present

## 2017-07-02 DIAGNOSIS — S82891S Other fracture of right lower leg, sequela: Secondary | ICD-10-CM | POA: Diagnosis not present

## 2017-07-08 DIAGNOSIS — S82891S Other fracture of right lower leg, sequela: Secondary | ICD-10-CM | POA: Diagnosis not present

## 2017-07-10 ENCOUNTER — Encounter: Payer: Self-pay | Admitting: Internal Medicine

## 2017-07-10 DIAGNOSIS — S82891S Other fracture of right lower leg, sequela: Secondary | ICD-10-CM | POA: Diagnosis not present

## 2017-07-14 DIAGNOSIS — S82891S Other fracture of right lower leg, sequela: Secondary | ICD-10-CM | POA: Diagnosis not present

## 2017-07-15 ENCOUNTER — Other Ambulatory Visit: Payer: Self-pay | Admitting: Internal Medicine

## 2017-07-15 DIAGNOSIS — K580 Irritable bowel syndrome with diarrhea: Secondary | ICD-10-CM

## 2017-07-15 MED ORDER — DICYCLOMINE HCL 20 MG PO TABS: 20.0000 mg | ORAL_TABLET | Freq: Four times a day (QID) | ORAL | 1 refills | Status: DC | PRN

## 2017-07-16 DIAGNOSIS — S82891S Other fracture of right lower leg, sequela: Secondary | ICD-10-CM | POA: Diagnosis not present

## 2017-07-23 DIAGNOSIS — S82891S Other fracture of right lower leg, sequela: Secondary | ICD-10-CM | POA: Diagnosis not present

## 2017-07-24 DIAGNOSIS — M545 Low back pain: Secondary | ICD-10-CM | POA: Diagnosis not present

## 2017-07-24 DIAGNOSIS — M5136 Other intervertebral disc degeneration, lumbar region: Secondary | ICD-10-CM | POA: Diagnosis not present

## 2017-07-27 DIAGNOSIS — S82891S Other fracture of right lower leg, sequela: Secondary | ICD-10-CM | POA: Diagnosis not present

## 2017-07-29 DIAGNOSIS — S82891D Other fracture of right lower leg, subsequent encounter for closed fracture with routine healing: Secondary | ICD-10-CM | POA: Diagnosis not present

## 2017-07-31 ENCOUNTER — Encounter: Payer: Self-pay | Admitting: Internal Medicine

## 2017-09-02 DIAGNOSIS — L700 Acne vulgaris: Secondary | ICD-10-CM | POA: Diagnosis not present

## 2017-09-02 DIAGNOSIS — Z23 Encounter for immunization: Secondary | ICD-10-CM | POA: Diagnosis not present

## 2017-09-02 DIAGNOSIS — D225 Melanocytic nevi of trunk: Secondary | ICD-10-CM | POA: Diagnosis not present

## 2017-09-02 DIAGNOSIS — L821 Other seborrheic keratosis: Secondary | ICD-10-CM | POA: Diagnosis not present

## 2017-09-02 DIAGNOSIS — D2239 Melanocytic nevi of other parts of face: Secondary | ICD-10-CM | POA: Diagnosis not present

## 2017-09-09 DIAGNOSIS — M542 Cervicalgia: Secondary | ICD-10-CM | POA: Diagnosis not present

## 2017-09-09 DIAGNOSIS — M791 Myalgia: Secondary | ICD-10-CM | POA: Diagnosis not present

## 2017-09-09 DIAGNOSIS — G518 Other disorders of facial nerve: Secondary | ICD-10-CM | POA: Diagnosis not present

## 2017-09-09 DIAGNOSIS — G43719 Chronic migraine without aura, intractable, without status migrainosus: Secondary | ICD-10-CM | POA: Diagnosis not present

## 2017-09-14 ENCOUNTER — Other Ambulatory Visit (INDEPENDENT_AMBULATORY_CARE_PROVIDER_SITE_OTHER): Payer: BLUE CROSS/BLUE SHIELD

## 2017-09-14 ENCOUNTER — Encounter: Payer: Self-pay | Admitting: Internal Medicine

## 2017-09-14 ENCOUNTER — Ambulatory Visit (INDEPENDENT_AMBULATORY_CARE_PROVIDER_SITE_OTHER): Payer: BLUE CROSS/BLUE SHIELD | Admitting: Internal Medicine

## 2017-09-14 VITALS — BP 76/50 | HR 76 | Ht 61.81 in | Wt 114.5 lb

## 2017-09-14 DIAGNOSIS — D500 Iron deficiency anemia secondary to blood loss (chronic): Secondary | ICD-10-CM | POA: Diagnosis not present

## 2017-09-14 DIAGNOSIS — K642 Third degree hemorrhoids: Secondary | ICD-10-CM | POA: Diagnosis not present

## 2017-09-14 DIAGNOSIS — K51519 Left sided colitis with unspecified complications: Secondary | ICD-10-CM

## 2017-09-14 LAB — CBC WITH DIFFERENTIAL/PLATELET
Basophils Absolute: 0.1 10*3/uL (ref 0.0–0.1)
Basophils Relative: 1.1 % (ref 0.0–3.0)
Eosinophils Absolute: 0.2 10*3/uL (ref 0.0–0.7)
Eosinophils Relative: 4 % (ref 0.0–5.0)
HCT: 36.7 % (ref 36.0–46.0)
Hemoglobin: 12.2 g/dL (ref 12.0–15.0)
Lymphocytes Relative: 55.1 % — ABNORMAL HIGH (ref 12.0–46.0)
Lymphs Abs: 2.6 10*3/uL (ref 0.7–4.0)
MCHC: 33.2 g/dL (ref 30.0–36.0)
MCV: 92.4 fl (ref 78.0–100.0)
Monocytes Absolute: 0.8 10*3/uL (ref 0.1–1.0)
Monocytes Relative: 17 % — ABNORMAL HIGH (ref 3.0–12.0)
Neutro Abs: 1.1 10*3/uL — ABNORMAL LOW (ref 1.4–7.7)
Neutrophils Relative %: 22.8 % — ABNORMAL LOW (ref 43.0–77.0)
Platelets: 175 10*3/uL (ref 150.0–400.0)
RBC: 3.97 Mil/uL (ref 3.87–5.11)
RDW: 13 % (ref 11.5–15.5)
WBC: 4.8 10*3/uL (ref 4.0–10.5)

## 2017-09-14 LAB — FERRITIN: Ferritin: 90.8 ng/mL (ref 10.0–291.0)

## 2017-09-14 NOTE — Assessment & Plan Note (Signed)
Labs

## 2017-09-14 NOTE — Progress Notes (Signed)
Shelly Wilkinson 46 y.o. 1971-12-17 [id number]  Assessment & Plan:   Encounter Diagnoses  Name Primary?  . Left sided ulcerative colitis (? Crohn's) with complication ([place]) Yes  . Iron deficiency anemia due to chronic blood loss   . Prolapsed internal hemorrhoids, grade 3    Improved on 4 months of Humira treatment. Slight increase in diarrhea after an episode of nausea and vomiting question if she had some sort of infectious gastroenteritis.  Continue with Humira 40 mg every other week, absent CBC and ferritin today.  Anticipate colonoscopy probably in December.   Subjective:   Chief Complaint:Follow-up of inflammatory bowel disease  HPI Jamarria year for follow-up of her left-sided colitis. She has been able to get off prednisone, she is doing quite well overall low in the last week she had an episode of nausea and unsettled stomach and vomited. She's had some diarrhea since then. Mild. Overall she was having formed stools most day and only once a day. Hemorrhoids are not nearly as symptomatic just mildly irritated at times, no bleeding and no prolapse symptoms reported. Her oldest son went off to [facility] and that's going well. Her 49 year old son is experiencing some issues with depression and anxiety. Duloxetine continues to work well for her stress and anxiety symptoms and helps with irritable bowel. Interstitial cystitis seems quiet since at  this time Allergies  Allergen Reactions  . Sulfa Antibiotics Hives, Nausea And Vomiting and Other (See Comments)    And fever   Current Meds  Medication Sig  . Adalimumab (HUMIRA PEN) 40 MG/0.8ML PNKT Inject 40 mg into the skin every 14 (fourteen) days. After the completion of the starter kit  . BOTOX 100 UNITS SOLR injection   . cholecalciferol (VITAMIN D) 1000 units tablet Take 1,000 Units by mouth daily.  Marland Kitchen dicyclomine (BENTYL) 20 MG tablet Take 1 tablet (20 mg total) by mouth every 6 (six) hours as needed for spasms  (abdominal cramps).  . Dietary Management Product (VSL#3) PACK Take 1 capsule by mouth daily  . dihydroergotamine (MIGRANAL) 4 MG/ML nasal spray Place 1 spray into the nose as needed.   . DULoxetine (CYMBALTA) 60 MG capsule TAKE 1 CAPSULE(60 MG) BY MOUTH DAILY  . gabapentin (NEURONTIN) 300 MG capsule Take 300 mg by mouth 2 (two) times daily. Take 325m in the morning and 6019mat night  . ibuprofen (ADVIL,MOTRIN) 200 MG tablet Take 200 mg by mouth every 6 (six) hours as needed.  . Marland Kitchenevothyroxine (SYNTHROID, LEVOTHROID) 88 MCG tablet Take 88 mcg by mouth daily before breakfast.  . metoCLOPramide (REGLAN) 10 MG tablet Take 1 tablet (10 mg total) by mouth at bedtime.  . Earney Navyicarbonate 40-1680 MG PACK Take 40 mg by mouth at bedtime.  . pentosan polysulfate (ELMIRON) 100 MG capsule Take 200 mg by mouth 2 (two) times daily.   . tazarotene (TAZORAC) 0.05 % cream Apply topically at bedtime.  . vitamin B-12 (CYANOCOBALAMIN) 1000 MCG tablet Take 1 tablet (1,000 mcg total) by mouth daily.   Past Medical History:  Diagnosis Date  . Anemia, iron deficiency 08/25/2016  . Arthritis   . Chronic anal fissure   . Complication of anesthesia    headaches/   06-22-2014 surg. had urinary retention  . Constipation due to outlet dysfunction   . Dyssynergic constipation 10/01/2016   Anorectal mano  . GERD (gastroesophageal reflux disease)   . Hyperlipidemia   . Hypothyroidism   . IBS (irritable bowel syndrome)   . IC (interstitial  cystitis)   . Indeterminate colitis 11/18/2016  . Migraines   . Thrombosed hemorrhoids   . Vitamin D deficiency 11/19/2016  . Wears glasses    Past Surgical History:  Procedure Laterality Date  . ANAL RECTAL MANOMETRY N/A 09/17/2016   Procedure: ANO RECTAL MANOMETRY;  Surgeon: Gatha Mayer, MD;  Location: [facility];  Service: Endoscopy;  Laterality: N/A;  . CHEILECTOMY Right 12/15/2013   Procedure: [place];  Surgeon: Wylene Simmer, MD;   Location: [facility];  Service: Orthopedics;  Laterality: Right;  . CHEILECTOMY Left 02-12-2006   great toe  . COLONOSCOPY  10-02-2010  . CYSTO/  HYDRODISTENTION/  INSTILLATION THERAPY  03-19-2010  . D & C HYSTEROSCOPY /  HYDROTHERMAL ENDOMETRIAL ABLATION/  LEEP  05-29-2011  . DILATION AND EVACUATION  2002  . EVALUATION UNDER ANESTHESIA WITH ANAL FISTULECTOMY N/A 12/26/2014   Procedure: EXAM UNDER ANESTHESIA ;  Surgeon: Leighton Ruff, MD;  Location: [facility];  Service: General;  Laterality: N/A;  . HEMORRHOID BANDING  02-16-2014  . REPAIR LACERATED RADIAL DIGITAL NERVE LEFT RING FINGER  08-25-2005  . SPHINCTEROTOMY N/A 12/26/2014   Procedure: CHEMICAL SPHINCTEROTOMY (BOTOX);  Surgeon: Leighton Ruff, MD;  Location: [facility];  Service: General;  Laterality: N/A;  . TRANSANAL HEMORRHOIDAL DEARTERIALIZATION  06-22-2014   Review of Systems As above  Objective:   Physical Exam @BP  (!) 76/50 (BP Location: Left Arm, Patient Position: Sitting, Cuff Size: Normal)   Pulse 76   Ht 5' 1.81" (1.57 m)   Wt 114 lb 8 oz (51.9 kg)   BMI 21.07 kg/m @  General:  NAD Eyes:   anicteric Lungs:  clear Heart::  S1S2 no rubs, murmurs or gallops Abdomen:  soft and nontender, BS+ Ext:   no edema, cyanosis or clubbing

## 2017-09-14 NOTE — Assessment & Plan Note (Signed)
Better  

## 2017-09-14 NOTE — Patient Instructions (Signed)
Your physician has requested that you go to the basement for lab work before leaving today.

## 2017-09-15 NOTE — Progress Notes (Signed)
Labs ok  My Chart note

## 2017-09-16 ENCOUNTER — Other Ambulatory Visit: Payer: Self-pay | Admitting: Internal Medicine

## 2017-09-22 ENCOUNTER — Encounter: Payer: Self-pay | Admitting: Internal Medicine

## 2017-10-01 DIAGNOSIS — Z23 Encounter for immunization: Secondary | ICD-10-CM | POA: Diagnosis not present

## 2017-10-12 ENCOUNTER — Encounter: Payer: Self-pay | Admitting: Internal Medicine

## 2017-11-06 ENCOUNTER — Other Ambulatory Visit: Payer: Self-pay | Admitting: Internal Medicine

## 2017-11-11 ENCOUNTER — Ambulatory Visit: Payer: BLUE CROSS/BLUE SHIELD | Admitting: *Deleted

## 2017-11-11 ENCOUNTER — Other Ambulatory Visit: Payer: Self-pay

## 2017-11-11 VITALS — Ht 62.5 in | Wt 114.0 lb

## 2017-11-11 DIAGNOSIS — K633 Ulcer of intestine: Secondary | ICD-10-CM

## 2017-11-11 NOTE — Progress Notes (Signed)
No allergies to eggs or soy. No problems with anesthesia.  Pt given Emmi instructions for colonoscopy  No oxygen use  No diet drug use  

## 2017-11-12 ENCOUNTER — Encounter: Payer: Self-pay | Admitting: Internal Medicine

## 2017-11-20 ENCOUNTER — Telehealth: Payer: Self-pay | Admitting: Gastroenterology

## 2017-11-20 NOTE — Telephone Encounter (Signed)
Patient called.  Is due for Humira injection tomorrow evening.  Is hoarse, very raspy voice.  Says that she has a cough, like stuff in the back of her throat.  No fever and overall feels ok.  Sounds like URI.  I advised that if she feels about the same or better tomorrow then she should be fine to take her injection.  If she feels worse, especially if she develops fever, then should hold off and call the office back on Monday with an update.

## 2017-11-24 ENCOUNTER — Telehealth: Payer: Self-pay | Admitting: Internal Medicine

## 2017-11-24 ENCOUNTER — Other Ambulatory Visit: Payer: Self-pay | Admitting: Internal Medicine

## 2017-11-24 ENCOUNTER — Telehealth: Payer: Self-pay | Admitting: *Deleted

## 2017-11-24 ENCOUNTER — Encounter: Payer: Self-pay | Admitting: Internal Medicine

## 2017-11-24 MED ORDER — ADALIMUMAB 40 MG/0.8ML ~~LOC~~ AJKT: 40.0000 mg | AUTO-INJECTOR | SUBCUTANEOUS | 6 refills | Status: DC

## 2017-11-24 NOTE — Telephone Encounter (Signed)
Patient called back and stated that she threw up when she got home.  She wants to cancel her appointment for tomorrow and will call to reschedule when she is feeling better.  Appointment was cancelled.

## 2017-11-24 NOTE — Telephone Encounter (Signed)
Returned pt's call. Pt c/o having a cold that seems to have gotten worse since yesterday. She has a headache and feels a little queezy or nauseated and a slight cough that is non productive. Pt has taken first 2 Dulcolax (part of her prep ) and started drinking water but has not started prep solution. Pt denies any fever.Dr Carlean Purl notified and will call pt when he finishes clinic. Pt notified that Dr. Carlean Purl will be calling her and to hold off on starting prep until Dr Carlean Purl and she decide whether to cancel procedure,

## 2017-11-24 NOTE — Telephone Encounter (Signed)
Shelly Wilkinson had had an upper respiratory infection, she thought was a cold, and she felt worse today in anticipation of her colonoscopy tomorrow she called to ask if she should still calm and then she called back because she vomited and needed to cancel the colonoscopy because she will not be able to prep.  Her Crohn's disease has been better overall.  She did take her Humira 2 days ago because she was feeling better but now she has nausea and vomiting still some laryngitis.  No fevers.  She will reschedule her colonoscopy.  She needs a refill on her Humira. I sent that in.  If she is not better soon she should be seen by primary care in urgent care.

## 2017-11-25 ENCOUNTER — Encounter: Payer: BLUE CROSS/BLUE SHIELD | Admitting: Internal Medicine

## 2017-12-10 DIAGNOSIS — G43719 Chronic migraine without aura, intractable, without status migrainosus: Secondary | ICD-10-CM | POA: Diagnosis not present

## 2017-12-10 DIAGNOSIS — G518 Other disorders of facial nerve: Secondary | ICD-10-CM | POA: Diagnosis not present

## 2017-12-10 DIAGNOSIS — M791 Myalgia, unspecified site: Secondary | ICD-10-CM | POA: Diagnosis not present

## 2017-12-10 DIAGNOSIS — M542 Cervicalgia: Secondary | ICD-10-CM | POA: Diagnosis not present

## 2017-12-28 DIAGNOSIS — H353131 Nonexudative age-related macular degeneration, bilateral, early dry stage: Secondary | ICD-10-CM | POA: Diagnosis not present

## 2017-12-30 ENCOUNTER — Encounter: Payer: Self-pay | Admitting: Internal Medicine

## 2018-01-02 ENCOUNTER — Other Ambulatory Visit: Payer: Self-pay | Admitting: Internal Medicine

## 2018-01-04 NOTE — Telephone Encounter (Signed)
OK to refill x 1 year

## 2018-01-04 NOTE — Telephone Encounter (Signed)
Okay to refill? 

## 2018-01-07 ENCOUNTER — Encounter (INDEPENDENT_AMBULATORY_CARE_PROVIDER_SITE_OTHER): Payer: BLUE CROSS/BLUE SHIELD | Admitting: Ophthalmology

## 2018-01-07 ENCOUNTER — Encounter: Payer: Self-pay | Admitting: Internal Medicine

## 2018-01-07 DIAGNOSIS — H43813 Vitreous degeneration, bilateral: Secondary | ICD-10-CM | POA: Diagnosis not present

## 2018-01-07 DIAGNOSIS — H2513 Age-related nuclear cataract, bilateral: Secondary | ICD-10-CM | POA: Diagnosis not present

## 2018-01-07 DIAGNOSIS — H353132 Nonexudative age-related macular degeneration, bilateral, intermediate dry stage: Secondary | ICD-10-CM | POA: Diagnosis not present

## 2018-01-07 DIAGNOSIS — D3131 Benign neoplasm of right choroid: Secondary | ICD-10-CM

## 2018-01-11 ENCOUNTER — Ambulatory Visit (AMBULATORY_SURGERY_CENTER): Payer: BLUE CROSS/BLUE SHIELD | Admitting: Internal Medicine

## 2018-01-11 ENCOUNTER — Encounter: Payer: Self-pay | Admitting: Internal Medicine

## 2018-01-11 ENCOUNTER — Other Ambulatory Visit: Payer: Self-pay

## 2018-01-11 VITALS — BP 91/56 | HR 69 | Temp 97.3°F | Resp 11 | Ht 61.0 in | Wt 114.0 lb

## 2018-01-11 DIAGNOSIS — K5010 Crohn's disease of large intestine without complications: Secondary | ICD-10-CM

## 2018-01-11 DIAGNOSIS — Z1211 Encounter for screening for malignant neoplasm of colon: Secondary | ICD-10-CM | POA: Diagnosis not present

## 2018-01-11 DIAGNOSIS — K529 Noninfective gastroenteritis and colitis, unspecified: Secondary | ICD-10-CM | POA: Diagnosis not present

## 2018-01-11 DIAGNOSIS — K5150 Left sided colitis without complications: Secondary | ICD-10-CM | POA: Diagnosis not present

## 2018-01-11 MED ORDER — SODIUM CHLORIDE 0.9 % IV SOLN: 500.0000 mL | INTRAVENOUS | Status: DC

## 2018-01-11 NOTE — Patient Instructions (Addendum)
   Things look similar to 2017 - I will await the biopsies and let you know what I think.  I appreciate the opportunity to care for you. Gatha Mayer, MD, [facility]   Discharge instructions given. Handout on Hemorrhoids. Biopsies taken. Resume previous medications. YOU HAD AN ENDOSCOPIC PROCEDURE TODAY AT [facility]:   Refer to the procedure report that was given to you for any specific questions about what was found during the examination.  If the procedure report does not answer your questions, please call your gastroenterologist to clarify.  If you requested that your care partner not be given the details of your procedure findings, then the procedure report has been included in a sealed envelope for you to review at your convenience later.  YOU SHOULD EXPECT: Some feelings of bloating in the abdomen. Passage of more gas than usual.  Walking can help get rid of the air that was put into your GI tract during the procedure and reduce the bloating. If you had a lower endoscopy (such as a colonoscopy or flexible sigmoidoscopy) you may notice spotting of blood in your stool or on the toilet paper. If you underwent a bowel prep for your procedure, you may not have a normal bowel movement for a few days.  Please Note:  You might notice some irritation and congestion in your nose or some drainage.  This is from the oxygen used during your procedure.  There is no need for concern and it should clear up in a day or so.  SYMPTOMS TO REPORT IMMEDIATELY:   Following lower endoscopy (colonoscopy or flexible sigmoidoscopy):  Excessive amounts of blood in the stool  Significant tenderness or worsening of abdominal pains  Swelling of the abdomen that is new, acute  Fever of 100F or higher  For urgent or emergent issues, a gastroenterologist can be reached at any hour by calling [phone].   DIET:  We do recommend a small meal at first, but then you may proceed to your  regular diet.  Drink plenty of fluids but you should avoid alcoholic beverages for 24 hours.  ACTIVITY:  You should plan to take it easy for the rest of today and you should NOT DRIVE or use heavy machinery until tomorrow (because of the sedation medicines used during the test).    FOLLOW UP: Our staff will call the number listed on your records the next business day following your procedure to check on you and address any questions or concerns that you may have regarding the information given to you following your procedure. If we do not reach you, we will leave a message.  However, if you are feeling well and you are not experiencing any problems, there is no need to return our call.  We will assume that you have returned to your regular daily activities without incident.  If any biopsies were taken you will be contacted by phone or by letter within the next 1-3 weeks.  Please call us at [phone] if you have not heard about the biopsies in 3 weeks.    SIGNATURES/CONFIDENTIALITY: You and/or your care partner have signed paperwork which will be entered into your electronic medical record.  These signatures attest to the fact that that the information above on your After Visit Summary has been reviewed and is understood.  Full responsibility of the confidentiality of this discharge information lies with you and/or your care-partner.

## 2018-01-11 NOTE — Progress Notes (Signed)
Called to room to assist during endoscopic procedure.  Patient ID and intended procedure confirmed with present staff. Received instructions for my participation in the procedure from the performing physician.  

## 2018-01-11 NOTE — Op Note (Signed)
[facility] Patient Name: Shelly Wilkinson Procedure Date: 01/11/2018 2:06 PM MRN: [id number] Endoscopist: Gatha Mayer , MD Age: 47 Referring MD:  Date of Birth: [birth year] Gender: Female Account #: [id number] Procedure:                Colonoscopy Indications:              Crohn's disease of the colon, Follow-up of Crohn's                            disease of the colon, Disease activity assessment                            of Crohn's disease of the colon Medicines:                Propofol per Anesthesia, Monitored Anesthesia Care Procedure:                Pre-Anesthesia Assessment:                           - Prior to the procedure, a History and Physical                            was performed, and patient medications and                            allergies were reviewed. The patient's tolerance of                            previous anesthesia was also reviewed. The risks                            and benefits of the procedure and the sedation                            options and risks were discussed with the patient.                            All questions were answered, and informed consent                            was obtained. Prior Anticoagulants: The patient has                            taken no previous anticoagulant or antiplatelet                            agents. ASA Grade Assessment: II - A patient with                            mild systemic disease. After reviewing the risks                            and benefits, the patient was deemed in  satisfactory condition to undergo the procedure.                           After obtaining informed consent, the colonoscope                            was passed under direct vision. Throughout the                            procedure, the patient's blood pressure, pulse, and                            oxygen saturations were monitored continuously. The   Colonoscope was introduced through the anus and                            advanced to the the terminal ileum, with                            identification of the appendiceal orifice and IC                            valve. The colonoscopy was performed without                            difficulty. The patient tolerated the procedure                            well. The quality of the bowel preparation was                            excellent. The terminal ileum, the appendiceal                            orifice and the rectum were photographed. The bowel                            preparation used was Miralax. Scope In: 2:16:45 PM Scope Out: 2:35:34 PM Scope Withdrawal Time: 0 hours 10 minutes 17 seconds  Total Procedure Duration: 0 hours 18 minutes 49 seconds  Findings:                 The perianal exam findings include internal                            hemorrhoids that prolapse with straining, but                            require manual replacement into the anal canal                            (Grade III).                           Inflammation characterized by erosions, aphthous  ulcerations and serpentine ulcerations was found as                            patches surrounded by normal mucosa and as patches                            surrounded by normal mucosa in the rectum, in the                            sigmoid colon and in the descending colon. The                            splenic flexure, the transverse colon, the hepatic                            flexure, the ascending colon and the cecum were                            spared. This was mild in severity. Biopsies were                            taken with a cold forceps for histology.                            Verification of patient identification for the                            specimen was done. Estimated blood loss was minimal.                           The splenic flexure,  transverse colon, hepatic                            flexure, ascending colon, cecum, appendiceal                            orifice and ileocecal valve appeared normal.                            Biopsies for histology were taken with a cold                            forceps from the ascending colon and transverse                            colon for evaluation of microscopic colitis.                            Verification of patient identification for the                            specimen was done. Estimated blood loss was minimal.  The terminal ileum appeared normal.                           The exam was otherwise without abnormality on                            direct and retroflexion views.                           External and internal hemorrhoids were found. The                            hemorrhoids were Grade III (internal hemorrhoids                            that prolapse but require manual reduction).                           The exam was otherwise without abnormality on                            direct and retroflexion views. Complications:            No immediate complications. Estimated Blood Loss:     Estimated blood loss was minimal. Impression:               - Internal hemorrhoids that prolapse with                            straining, but require manual replacement into the                            anal canal (Grade III) found on perianal exam. Can                            see scars from prior hemorrhoidectomy also.                           - Crohn's disease with colonic involvement.                            Inflammation was found in the colon, in the rectum,                            in the sigmoid colon and in the descending colon.                            This was mild in severity. Biopsied.                           - The splenic flexure, transverse colon, hepatic                            flexure, ascending colon,  cecum, appendiceal  orifice and ileocecal valve are normal. Biopsied.                           - The examined portion of the ileum was normal. Recommendation:           - Patient has a contact number available for                            emergencies. The signs and symptoms of potential                            delayed complications were discussed with the                            patient. Return to normal activities tomorrow.                            Written discharge instructions were provided to the                            patient.                           - Resume previous diet.                           - Continue present medications.                           - Await pathology results.                           - Clinically better but still with inflammation                            that looks like left-sided Crohn's disease - patchy                            ulceration fits with that - but rectal involvvement                            fits UC.                           - Repeat colonoscopy is recommended. The                            colonoscopy date will be determined after pathology                            results from today's exam become available for                            review. Gatha Mayer, MD 01/11/2018 2:47:24 PM This report has been signed electronically.

## 2018-01-12 ENCOUNTER — Telehealth: Payer: Self-pay | Admitting: *Deleted

## 2018-01-12 NOTE — Telephone Encounter (Signed)
  Follow up Call-  Call back number 01/11/2018 11/06/2016  Post procedure Call Back phone  # [phone]  Permission to leave phone message Yes Yes  Some recent data might be hidden     Patient questions:  Do you have a fever, pain , or abdominal swelling? No. Pain Score  0 *  Have you tolerated food without any problems? Yes.    Have you been able to return to your normal activities? Yes.    Do you have any questions about your discharge instructions: Diet   No. Medications  No. Follow up visit  No.  Do you have questions or concerns about your Care? No.  Actions: * If pain score is 4 or above: No action needed, pain <4.

## 2018-01-18 NOTE — Progress Notes (Signed)
I am sending a My Chart message  Please remove 2022 colon recall on books and keep 2021 recall in place

## 2018-01-20 ENCOUNTER — Other Ambulatory Visit: Payer: Self-pay | Admitting: Internal Medicine

## 2018-01-20 DIAGNOSIS — K51519 Left sided colitis with unspecified complications: Secondary | ICD-10-CM

## 2018-01-20 NOTE — Progress Notes (Signed)
I spoke to her  She will do adalimumab Ab/levels on 2/4  I ordered them

## 2018-01-22 DIAGNOSIS — M791 Myalgia, unspecified site: Secondary | ICD-10-CM | POA: Diagnosis not present

## 2018-01-22 DIAGNOSIS — G43719 Chronic migraine without aura, intractable, without status migrainosus: Secondary | ICD-10-CM | POA: Diagnosis not present

## 2018-01-22 DIAGNOSIS — G518 Other disorders of facial nerve: Secondary | ICD-10-CM | POA: Diagnosis not present

## 2018-01-22 DIAGNOSIS — R51 Headache: Secondary | ICD-10-CM | POA: Diagnosis not present

## 2018-01-22 DIAGNOSIS — M542 Cervicalgia: Secondary | ICD-10-CM | POA: Diagnosis not present

## 2018-02-01 ENCOUNTER — Other Ambulatory Visit: Payer: Self-pay | Admitting: Internal Medicine

## 2018-02-01 ENCOUNTER — Other Ambulatory Visit: Payer: BLUE CROSS/BLUE SHIELD

## 2018-02-01 DIAGNOSIS — K51519 Left sided colitis with unspecified complications: Secondary | ICD-10-CM

## 2018-02-02 NOTE — Telephone Encounter (Signed)
How many refills Sir?

## 2018-02-04 NOTE — Telephone Encounter (Signed)
Refill x 1 year

## 2018-02-06 LAB — ADALIMUMAB+AB (SERIAL MONITOR)
Adalimumab Drug Level: 8 ug/mL
Anti-Adalimumab Antibody: 32 ng/mL

## 2018-02-06 LAB — SERIAL MONITORING

## 2018-02-10 NOTE — Progress Notes (Signed)
Levels ok My Chart message and will call later

## 2018-02-12 ENCOUNTER — Encounter: Payer: Self-pay | Admitting: Internal Medicine

## 2018-02-24 NOTE — Progress Notes (Signed)
Spoke to her She is doing well w/ formed stools Reviewed options of staying w/ same Tx, weekly Humira, adding 6 MP  We have decided to stay same Tx and reassess w/ OV in May/June - she will call for appt  Call back sooner prn

## 2018-03-02 DIAGNOSIS — N3010 Interstitial cystitis (chronic) without hematuria: Secondary | ICD-10-CM | POA: Diagnosis not present

## 2018-03-02 DIAGNOSIS — R350 Frequency of micturition: Secondary | ICD-10-CM | POA: Diagnosis not present

## 2018-03-02 DIAGNOSIS — L700 Acne vulgaris: Secondary | ICD-10-CM | POA: Diagnosis not present

## 2018-03-02 DIAGNOSIS — R300 Dysuria: Secondary | ICD-10-CM | POA: Diagnosis not present

## 2018-03-10 DIAGNOSIS — M542 Cervicalgia: Secondary | ICD-10-CM | POA: Diagnosis not present

## 2018-03-10 DIAGNOSIS — M791 Myalgia, unspecified site: Secondary | ICD-10-CM | POA: Diagnosis not present

## 2018-03-10 DIAGNOSIS — G518 Other disorders of facial nerve: Secondary | ICD-10-CM | POA: Diagnosis not present

## 2018-03-10 DIAGNOSIS — G43719 Chronic migraine without aura, intractable, without status migrainosus: Secondary | ICD-10-CM | POA: Diagnosis not present

## 2018-04-01 ENCOUNTER — Encounter: Payer: Self-pay | Admitting: Internal Medicine

## 2018-04-05 ENCOUNTER — Other Ambulatory Visit: Payer: Self-pay | Admitting: Obstetrics & Gynecology

## 2018-04-05 DIAGNOSIS — Z1231 Encounter for screening mammogram for malignant neoplasm of breast: Secondary | ICD-10-CM

## 2018-04-06 DIAGNOSIS — D485 Neoplasm of uncertain behavior of skin: Secondary | ICD-10-CM | POA: Diagnosis not present

## 2018-04-06 DIAGNOSIS — L929 Granulomatous disorder of the skin and subcutaneous tissue, unspecified: Secondary | ICD-10-CM | POA: Diagnosis not present

## 2018-04-20 DIAGNOSIS — R51 Headache: Secondary | ICD-10-CM | POA: Diagnosis not present

## 2018-04-20 DIAGNOSIS — G43719 Chronic migraine without aura, intractable, without status migrainosus: Secondary | ICD-10-CM | POA: Diagnosis not present

## 2018-04-20 DIAGNOSIS — G518 Other disorders of facial nerve: Secondary | ICD-10-CM | POA: Diagnosis not present

## 2018-04-20 DIAGNOSIS — M791 Myalgia, unspecified site: Secondary | ICD-10-CM | POA: Diagnosis not present

## 2018-04-20 DIAGNOSIS — M542 Cervicalgia: Secondary | ICD-10-CM | POA: Diagnosis not present

## 2018-04-22 DIAGNOSIS — N3010 Interstitial cystitis (chronic) without hematuria: Secondary | ICD-10-CM | POA: Diagnosis not present

## 2018-04-27 ENCOUNTER — Ambulatory Visit
Admission: RE | Admit: 2018-04-27 | Discharge: 2018-04-27 | Disposition: A | Discharge status: Home / Self Care | Payer: BLUE CROSS/BLUE SHIELD | Source: Ambulatory Visit | Attending: Obstetrics & Gynecology | Admitting: Obstetrics & Gynecology

## 2018-04-27 DIAGNOSIS — Z1231 Encounter for screening mammogram for malignant neoplasm of breast: Secondary | ICD-10-CM

## 2018-04-29 ENCOUNTER — Other Ambulatory Visit: Payer: Self-pay | Admitting: Obstetrics & Gynecology

## 2018-04-29 DIAGNOSIS — R928 Other abnormal and inconclusive findings on diagnostic imaging of breast: Secondary | ICD-10-CM

## 2018-04-30 ENCOUNTER — Ambulatory Visit
Admission: RE | Admit: 2018-04-30 | Discharge: 2018-04-30 | Disposition: A | Discharge status: Home / Self Care | Payer: BLUE CROSS/BLUE SHIELD | Source: Ambulatory Visit | Attending: Obstetrics & Gynecology | Admitting: Obstetrics & Gynecology

## 2018-04-30 ENCOUNTER — Other Ambulatory Visit: Payer: Self-pay | Admitting: Obstetrics & Gynecology

## 2018-04-30 DIAGNOSIS — N6321 Unspecified lump in the left breast, upper outer quadrant: Secondary | ICD-10-CM | POA: Diagnosis not present

## 2018-04-30 DIAGNOSIS — N6311 Unspecified lump in the right breast, upper outer quadrant: Secondary | ICD-10-CM | POA: Diagnosis not present

## 2018-04-30 DIAGNOSIS — R928 Other abnormal and inconclusive findings on diagnostic imaging of breast: Secondary | ICD-10-CM

## 2018-04-30 DIAGNOSIS — R922 Inconclusive mammogram: Secondary | ICD-10-CM | POA: Diagnosis not present

## 2018-04-30 DIAGNOSIS — N63 Unspecified lump in unspecified breast: Secondary | ICD-10-CM

## 2018-05-03 ENCOUNTER — Encounter: Payer: Self-pay | Admitting: Obstetrics & Gynecology

## 2018-05-03 ENCOUNTER — Encounter (INDEPENDENT_AMBULATORY_CARE_PROVIDER_SITE_OTHER): Payer: Self-pay

## 2018-05-03 DIAGNOSIS — R928 Other abnormal and inconclusive findings on diagnostic imaging of breast: Secondary | ICD-10-CM | POA: Insufficient documentation

## 2018-05-11 ENCOUNTER — Ambulatory Visit (INDEPENDENT_AMBULATORY_CARE_PROVIDER_SITE_OTHER): Payer: BLUE CROSS/BLUE SHIELD | Admitting: Obstetrics & Gynecology

## 2018-05-11 ENCOUNTER — Encounter: Payer: Self-pay | Admitting: Obstetrics & Gynecology

## 2018-05-11 VITALS — BP 108/72 | HR 75 | Ht 64.0 in | Wt 115.0 lb

## 2018-05-11 DIAGNOSIS — Z124 Encounter for screening for malignant neoplasm of cervix: Secondary | ICD-10-CM

## 2018-05-11 DIAGNOSIS — Z01419 Encounter for gynecological examination (general) (routine) without abnormal findings: Secondary | ICD-10-CM | POA: Diagnosis not present

## 2018-05-11 DIAGNOSIS — N951 Menopausal and female climacteric states: Secondary | ICD-10-CM | POA: Diagnosis not present

## 2018-05-11 DIAGNOSIS — Z1151 Encounter for screening for human papillomavirus (HPV): Secondary | ICD-10-CM

## 2018-05-11 MED ORDER — DULOXETINE HCL 30 MG PO CPEP: 30.0000 mg | ORAL_CAPSULE | Freq: Every day | ORAL | 12 refills | Status: DC

## 2018-05-12 LAB — FOLLICLE STIMULATING HORMONE: FSH: 26.1 m[IU]/mL

## 2018-05-12 NOTE — Progress Notes (Signed)
Subjective:     Shelly Wilkinson is a lovely 47 y.o. female here for a routine exam.  Current complaints: needs pap smear.  She is also having decreased in intensity of orgasm after starting Cymbalta.  Pt on 60 mg and interested in decreasing dose to see if it manages symptoms with less side effects.  Pt curious if she is approaching menopause.  She has staining occasionally (had ablation many years ago).   Gynecologic History No LMP recorded. Patient has had an ablation. Last Pap: 2016. Results were: normal Last mammogram: 2019; needed further views and needs 6 month follow up.    Obstetric History OB History  Gravida Para Term Preterm AB Living  3 2 2   1 2   SAB TAB Ectopic Multiple Live Births  1            # Outcome Date GA Lbr Len/2nd Weight Sex Delivery Anes PTL Lv  3 SAB           2 Term           1 Term              The following portions of the patient's history were reviewed and updated as appropriate: allergies, current medications, past family history, past medical history, past social history, past surgical history and problem list.  Review of Systems Pertinent items noted in HPI and remainder of comprehensive ROS otherwise negative.    Objective:      Vitals:   05/11/18 1618  BP: 108/72  Pulse: 75  Weight: 115 lb (52.2 kg)  Height: 5' 4"  (1.626 m)   Vitals:  WNL General appearance: alert, cooperative and no distress  HEENT: Normocephalic, without obvious abnormality, atraumatic Eyes: negative Throat: lips, mucosa, and tongue normal; teeth and gums normal  Respiratory: Clear to auscultation bilaterally  CV: Regular rate and rhythm  Breasts:  Normal appearance, no masses or tenderness, no nipple retraction or dimpling  GI: Soft, non-tender; bowel sounds normal; no masses,  no organomegaly  GU: External Genitalia:  Tanner V, no lesion Urethra:  No prolapse   Vagina: Pink, normal rugae, no blood or discharge; stable 1 cm mass in right vaginal apex (biospied  in 2016 and benign -- results in Epic)  Cervix: No CMT, no lesion  Uterus:  Normal size and contour, non tender  Adnexa: Normal, no masses, non tender  Musculoskeletal: No edema, redness or tenderness in the calves or thighs  Skin: No lesions or rash  Lymphatic: Axillary adenopathy: none     Psychiatric: Normal mood and behavior        Assessment:    Healthy female exam.    Plan:    Pap with cotesting FSH Decrease Cymbalta to 30 mg to see if improves side effects (decreaesd orgasm intensity) while still having good anxiety / bowel symptoms.   Mammograms as directed by breast center. Keep menstrual calendar to track bleeding--even if scant. Follow up with GI and PCP for routine care.

## 2018-05-13 LAB — CYTOLOGY - PAP
Adequacy: ABSENT
Diagnosis: NEGATIVE
HPV: NOT DETECTED

## 2018-06-08 ENCOUNTER — Encounter: Payer: Self-pay | Admitting: Obstetrics & Gynecology

## 2018-06-09 ENCOUNTER — Other Ambulatory Visit: Payer: Self-pay

## 2018-06-09 ENCOUNTER — Encounter: Payer: Self-pay | Admitting: Internal Medicine

## 2018-06-09 ENCOUNTER — Encounter (INDEPENDENT_AMBULATORY_CARE_PROVIDER_SITE_OTHER): Payer: Self-pay

## 2018-06-09 ENCOUNTER — Other Ambulatory Visit: Payer: Self-pay | Admitting: Internal Medicine

## 2018-06-09 DIAGNOSIS — G43719 Chronic migraine without aura, intractable, without status migrainosus: Secondary | ICD-10-CM | POA: Diagnosis not present

## 2018-06-09 DIAGNOSIS — G518 Other disorders of facial nerve: Secondary | ICD-10-CM | POA: Diagnosis not present

## 2018-06-09 DIAGNOSIS — E039 Hypothyroidism, unspecified: Secondary | ICD-10-CM | POA: Diagnosis not present

## 2018-06-09 DIAGNOSIS — Z79899 Other long term (current) drug therapy: Secondary | ICD-10-CM

## 2018-06-09 DIAGNOSIS — M791 Myalgia, unspecified site: Secondary | ICD-10-CM | POA: Diagnosis not present

## 2018-06-09 DIAGNOSIS — K51519 Left sided colitis with unspecified complications: Secondary | ICD-10-CM

## 2018-06-09 DIAGNOSIS — K5010 Crohn's disease of large intestine without complications: Secondary | ICD-10-CM

## 2018-06-09 DIAGNOSIS — R232 Flushing: Secondary | ICD-10-CM | POA: Diagnosis not present

## 2018-06-09 DIAGNOSIS — E559 Vitamin D deficiency, unspecified: Secondary | ICD-10-CM | POA: Diagnosis not present

## 2018-06-09 DIAGNOSIS — Z7960 Long term (current) use of unspecified immunomodulators and immunosuppressants: Secondary | ICD-10-CM

## 2018-06-09 DIAGNOSIS — M542 Cervicalgia: Secondary | ICD-10-CM | POA: Diagnosis not present

## 2018-06-09 NOTE — Progress Notes (Signed)
Quant

## 2018-06-11 DIAGNOSIS — R232 Flushing: Secondary | ICD-10-CM | POA: Diagnosis not present

## 2018-06-11 DIAGNOSIS — E559 Vitamin D deficiency, unspecified: Secondary | ICD-10-CM | POA: Diagnosis not present

## 2018-06-11 DIAGNOSIS — E039 Hypothyroidism, unspecified: Secondary | ICD-10-CM | POA: Diagnosis not present

## 2018-06-16 ENCOUNTER — Other Ambulatory Visit: Payer: BLUE CROSS/BLUE SHIELD

## 2018-06-16 DIAGNOSIS — K5010 Crohn's disease of large intestine without complications: Secondary | ICD-10-CM

## 2018-06-16 DIAGNOSIS — K51519 Left sided colitis with unspecified complications: Secondary | ICD-10-CM

## 2018-06-16 DIAGNOSIS — Z7960 Long term (current) use of unspecified immunomodulators and immunosuppressants: Secondary | ICD-10-CM

## 2018-06-16 DIAGNOSIS — Z79899 Other long term (current) drug therapy: Secondary | ICD-10-CM

## 2018-06-18 LAB — QUANTIFERON-TB GOLD PLUS
Mitogen-NIL: >10 IU/mL
NIL: 0.02 IU/mL
QuantiFERON-TB Gold Plus: NEGATIVE
TB1-NIL: 0 IU/mL
TB2-NIL: 0 IU/mL

## 2018-06-19 NOTE — Progress Notes (Signed)
Negative for TB

## 2018-07-02 DIAGNOSIS — H35363 Drusen (degenerative) of macula, bilateral: Secondary | ICD-10-CM | POA: Diagnosis not present

## 2018-07-02 DIAGNOSIS — H3554 Dystrophies primarily involving the retinal pigment epithelium: Secondary | ICD-10-CM | POA: Diagnosis not present

## 2018-07-02 DIAGNOSIS — H2513 Age-related nuclear cataract, bilateral: Secondary | ICD-10-CM | POA: Diagnosis not present

## 2018-07-02 DIAGNOSIS — D3131 Benign neoplasm of right choroid: Secondary | ICD-10-CM | POA: Diagnosis not present

## 2018-07-07 ENCOUNTER — Encounter (INDEPENDENT_AMBULATORY_CARE_PROVIDER_SITE_OTHER): Payer: BLUE CROSS/BLUE SHIELD | Admitting: Ophthalmology

## 2018-07-26 DIAGNOSIS — M791 Myalgia, unspecified site: Secondary | ICD-10-CM | POA: Diagnosis not present

## 2018-07-26 DIAGNOSIS — R51 Headache: Secondary | ICD-10-CM | POA: Diagnosis not present

## 2018-07-26 DIAGNOSIS — M542 Cervicalgia: Secondary | ICD-10-CM | POA: Diagnosis not present

## 2018-07-26 DIAGNOSIS — G518 Other disorders of facial nerve: Secondary | ICD-10-CM | POA: Diagnosis not present

## 2018-07-26 DIAGNOSIS — G43719 Chronic migraine without aura, intractable, without status migrainosus: Secondary | ICD-10-CM | POA: Diagnosis not present

## 2018-07-28 DIAGNOSIS — E039 Hypothyroidism, unspecified: Secondary | ICD-10-CM | POA: Diagnosis not present

## 2018-09-01 ENCOUNTER — Ambulatory Visit (INDEPENDENT_AMBULATORY_CARE_PROVIDER_SITE_OTHER): Payer: BLUE CROSS/BLUE SHIELD | Admitting: Internal Medicine

## 2018-09-01 ENCOUNTER — Encounter: Payer: Self-pay | Admitting: Internal Medicine

## 2018-09-01 ENCOUNTER — Other Ambulatory Visit (INDEPENDENT_AMBULATORY_CARE_PROVIDER_SITE_OTHER): Payer: BLUE CROSS/BLUE SHIELD

## 2018-09-01 DIAGNOSIS — Z79899 Other long term (current) drug therapy: Secondary | ICD-10-CM

## 2018-09-01 DIAGNOSIS — Z23 Encounter for immunization: Secondary | ICD-10-CM

## 2018-09-01 DIAGNOSIS — Z7960 Long term (current) use of unspecified immunomodulators and immunosuppressants: Secondary | ICD-10-CM

## 2018-09-01 DIAGNOSIS — D500 Iron deficiency anemia secondary to blood loss (chronic): Secondary | ICD-10-CM | POA: Diagnosis not present

## 2018-09-01 DIAGNOSIS — K642 Third degree hemorrhoids: Secondary | ICD-10-CM

## 2018-09-01 DIAGNOSIS — K51519 Left sided colitis with unspecified complications: Secondary | ICD-10-CM

## 2018-09-01 LAB — CBC WITH DIFFERENTIAL/PLATELET
Basophils Absolute: 0.1 10*3/uL (ref 0.0–0.1)
Basophils Relative: 1 % (ref 0.0–3.0)
Eosinophils Absolute: 0.2 10*3/uL (ref 0.0–0.7)
Eosinophils Relative: 4 % (ref 0.0–5.0)
HCT: 34.1 % — ABNORMAL LOW (ref 36.0–46.0)
Hemoglobin: 11.4 g/dL — ABNORMAL LOW (ref 12.0–15.0)
Lymphocytes Relative: 53.7 % — ABNORMAL HIGH (ref 12.0–46.0)
Lymphs Abs: 3.2 10*3/uL (ref 0.7–4.0)
MCHC: 33.4 g/dL (ref 30.0–36.0)
MCV: 86.9 fl (ref 78.0–100.0)
Monocytes Absolute: 0.7 10*3/uL (ref 0.1–1.0)
Monocytes Relative: 11.7 % (ref 3.0–12.0)
Neutro Abs: 1.8 10*3/uL (ref 1.4–7.7)
Neutrophils Relative %: 29.6 % — ABNORMAL LOW (ref 43.0–77.0)
Platelets: 209 10*3/uL (ref 150.0–400.0)
RBC: 3.93 Mil/uL (ref 3.87–5.11)
RDW: 13.2 % (ref 11.5–15.5)
WBC: 5.9 10*3/uL (ref 4.0–10.5)

## 2018-09-01 LAB — COMPREHENSIVE METABOLIC PANEL
ALT: 15 U/L (ref 0–35)
AST: 15 U/L (ref 0–37)
Albumin: 4.3 g/dL (ref 3.5–5.2)
Alkaline Phosphatase: 61 U/L (ref 39–117)
BUN: 14 mg/dL (ref 6–23)
CO2: 32 mEq/L (ref 19–32)
Calcium: 9.9 mg/dL (ref 8.4–10.5)
Chloride: 101 mEq/L (ref 96–112)
Creatinine, Ser: 0.72 mg/dL (ref 0.40–1.20)
GFR: 91.79 mL/min (ref >60.00)
Glucose, Bld: 84 mg/dL (ref 70–99)
Potassium: 4.2 mEq/L (ref 3.5–5.1)
Sodium: 137 mEq/L (ref 135–145)
Total Bilirubin: 0.8 mg/dL (ref 0.2–1.2)
Total Protein: 7.7 g/dL (ref 6.0–8.3)

## 2018-09-01 NOTE — Assessment & Plan Note (Signed)
Vaccines HAV? HBV Prevnar today

## 2018-09-01 NOTE — Assessment & Plan Note (Signed)
CBC

## 2018-09-01 NOTE — Patient Instructions (Signed)
Your provider has requested that you go to the basement level for lab work before leaving today. Press "B" on the elevator. The lab is located at the first door on the left as you exit the elevator.  Please take 1 teaspoon of benefiber daily. Try to work up to 1 tablespoon daily.   Please let us know when you get your flu shot.   Please follow up with Dr Carlean Purl in 6 months.   Today you have been given a Prevnar 13 vaccine today along with the information paper.   I appreciate the opportunity to care for you. Silvano Rusk, MD, [facility]

## 2018-09-01 NOTE — Assessment & Plan Note (Signed)
Doing well Labs today

## 2018-09-01 NOTE — Assessment & Plan Note (Signed)
Better Benefiber 1 tsp and tritrate up

## 2018-09-01 NOTE — Progress Notes (Signed)
Shelly Wilkinson 47 y.o. 1971-01-29 [id number]  Assessment & Plan:   Left sided ulcerative colitis (? Crohn's) with complication ([name]) Doing well Labs today  Long-term use of immunosuppressant medication - Humira Vaccines HAV? HBV Prevnar today  Anemia, iron deficiency CBC  Prolapsed internal hemorrhoids, grade 3 Better Benefiber 1 tsp and tritrate up  I appreciate the opportunity to care for this patient. CC: Kelton Pillar, MD    Subjective:   Chief Complaint: Follow-up of left-sided inflammatory bowel disease Crohn's versus UC  HPI Shelly Wilkinson is here for follow-up of inflammatory bowel disease and reports she is doing quite well.  I last saw her in early 2019 at that time a colonoscopy demonstrated some persistent inflammation, patchy in the left colon and her hemorrhoids.  She has continued on Humira 40 mg every other week and actually over time has gotten much better gained some weight and is feeling better.  She is able to liberalize her diet a little bit.  1 or 2 stools a day normal sometimes they are a little hard to start she is trying to avoid straining.  She reports that she stopped eating oatmeal "that they say is healthy for you" and feels better.  She is teaching school again. Allergies  Allergen Reactions  . Sulfa Antibiotics Hives, Nausea And Vomiting and Other (See Comments)    And fever   Current Meds  Medication Sig  . BOTOX 100 UNITS SOLR injection   . cholecalciferol (VITAMIN D) 1000 units tablet Take 1,000 Units by mouth daily.  Marland Kitchen dihydroergotamine (MIGRANAL) 4 MG/ML nasal spray Place 1 spray into the nose as needed.   . DULoxetine (CYMBALTA) 60 MG capsule Take 1 capsule by mouth daily.  Marland Kitchen gabapentin (NEURONTIN) 300 MG capsule Take 300 mg by mouth 2 (two) times daily. Take 341m in the morning and 6097mat night  . HUMIRA PEN 40 MG/0.8ML PNKT INJECT 40 MG (0.8 ML) UNDER THE SKIN EVERY 14 DAYS  . ibuprofen (ADVIL,MOTRIN) 200 MG tablet Take 200 mg by  mouth every 6 (six) hours as needed.  . pentosan polysulfate (ELMIRON) 100 MG capsule Take 200 mg by mouth 2 (two) times daily.   . Marland KitchenYNTHROID 75 MCG tablet Take 1 tablet by mouth daily.  . tazarotene (TAZORAC) 0.05 % cream Apply topically at bedtime.   Past Medical History:  Diagnosis Date  . Allergy   . Anemia, iron deficiency 08/25/2016  . Anxiety   . Arthritis   . Chronic anal fissure   . Complication of anesthesia    headaches/   06-22-2014 surg. had urinary retention  . Constipation due to outlet dysfunction   . Dyssynergic constipation 10/01/2016   Anorectal mano  . GERD (gastroesophageal reflux disease)   . Hyperlipidemia   . Hypothyroidism   . IBS (irritable bowel syndrome)   . IC (interstitial cystitis)   . Indeterminate colitis 11/18/2016  . Macular degeneration   . Migraines   . Thrombosed hemorrhoids   . Vitamin D deficiency 11/19/2016  . Wears glasses    Past Surgical History:  Procedure Laterality Date  . ANAL RECTAL MANOMETRY N/A 09/17/2016   Procedure: ANO RECTAL MANOMETRY;  Surgeon: CaGatha MayerMD;  [facility];  Service: Endoscopy;  Laterality: N/A;  . CHEILECTOMY Right 12/15/2013   Procedure: [place] Surgeon: JoWylene SimmerMD;  [facility] Service: Orthopedics;  Laterality: Right;  . CHEILECTOMY Left 02-12-2006   great toe  . COLONOSCOPY  10-02-2010  . CYSTO/  HYDRODISTENTION/  INSTILLATION THERAPY  03-19-2010  . D & C HYSTEROSCOPY /  HYDROTHERMAL ENDOMETRIAL ABLATION/  LEEP  05-29-2011  . DILATION AND EVACUATION  2002  . EVALUATION UNDER ANESTHESIA WITH ANAL FISTULECTOMY N/A 12/26/2014   Procedure: EXAM UNDER ANESTHESIA ;  Surgeon: Leighton Ruff, MD;  Location: [facility];  Service: General;  Laterality: N/A;  . HEMORRHOID BANDING  02-16-2014  . REPAIR LACERATED RADIAL DIGITAL NERVE LEFT RING FINGER  08-25-2005  . SPHINCTEROTOMY N/A 12/26/2014   Procedure: CHEMICAL SPHINCTEROTOMY  (BOTOX);  Surgeon: Leighton Ruff, MD;  Location: [facility];  Service: General;  Laterality: N/A;  . TRANSANAL HEMORRHOIDAL DEARTERIALIZATION  06-22-2014   Social History   Social History Narrative   Married, 3 children   Third grade teacher   family history includes Colitis in her father; Colon cancer (age of onset: 6) in her maternal aunt; Diabetes in her father; Heart attack in her father and paternal grandmother; Heart disease in her father; Hypertension in her father; Lung cancer in her paternal grandfather; Rheum arthritis in her maternal grandmother; Stroke in her father.   Review of Systems See HPI  Objective:   Physical Exam @BP  (!) 82/50   Pulse 88   Ht 5' 1"  (1.549 m)   Wt 116 lb 9.6 oz (52.9 kg)   BMI 22.03 kg/m @  General:  NAD Eyes:   anicteric Lungs:  clear Heart::  S1S2 no rubs, murmurs or gallops Abdomen:  soft and nontender, BS+ Ext:   no edema, cyanosis or clubbing    Data Reviewed:    See HPI

## 2018-09-02 LAB — HEPATITIS A ANTIBODY, TOTAL: Hepatitis A AB,Total: NONREACTIVE

## 2018-09-02 NOTE — Progress Notes (Signed)
Labs are ok She is barely anemic -  She is not immune to Hep A (nor Hep B from prior testing)  Plan: 1) HepA/HepB injections for immunization 2) B12 and folate dx mild anemia - can do when she comes to start the hepatitis vaccines

## 2018-09-03 ENCOUNTER — Other Ambulatory Visit: Payer: Self-pay

## 2018-09-03 DIAGNOSIS — D649 Anemia, unspecified: Secondary | ICD-10-CM

## 2018-09-03 DIAGNOSIS — Z7960 Long term (current) use of unspecified immunomodulators and immunosuppressants: Secondary | ICD-10-CM

## 2018-09-03 DIAGNOSIS — Z79899 Other long term (current) drug therapy: Secondary | ICD-10-CM

## 2018-09-03 NOTE — Progress Notes (Signed)
b12

## 2018-09-07 ENCOUNTER — Other Ambulatory Visit (INDEPENDENT_AMBULATORY_CARE_PROVIDER_SITE_OTHER): Payer: BLUE CROSS/BLUE SHIELD

## 2018-09-07 ENCOUNTER — Ambulatory Visit (INDEPENDENT_AMBULATORY_CARE_PROVIDER_SITE_OTHER): Payer: BLUE CROSS/BLUE SHIELD | Admitting: Internal Medicine

## 2018-09-07 DIAGNOSIS — D649 Anemia, unspecified: Secondary | ICD-10-CM

## 2018-09-07 DIAGNOSIS — Z7960 Long term (current) use of unspecified immunomodulators and immunosuppressants: Secondary | ICD-10-CM

## 2018-09-07 DIAGNOSIS — Z79899 Other long term (current) drug therapy: Secondary | ICD-10-CM

## 2018-09-07 DIAGNOSIS — Z23 Encounter for immunization: Secondary | ICD-10-CM | POA: Diagnosis not present

## 2018-09-07 LAB — FOLATE: Folate: 8.3 ng/mL (ref >5.9)

## 2018-09-07 LAB — VITAMIN B12: Vitamin B-12: 1174 pg/mL — ABNORMAL HIGH (ref 211–911)

## 2018-09-08 DIAGNOSIS — M542 Cervicalgia: Secondary | ICD-10-CM | POA: Diagnosis not present

## 2018-09-08 DIAGNOSIS — G43719 Chronic migraine without aura, intractable, without status migrainosus: Secondary | ICD-10-CM | POA: Diagnosis not present

## 2018-09-08 DIAGNOSIS — M791 Myalgia, unspecified site: Secondary | ICD-10-CM | POA: Diagnosis not present

## 2018-09-08 DIAGNOSIS — G518 Other disorders of facial nerve: Secondary | ICD-10-CM | POA: Diagnosis not present

## 2018-09-10 NOTE — Progress Notes (Signed)
Let her know these labs are ok  My apologies to her but I did not ask for a ferritin and would like her to do that at her convenience - not urgent  Dx mild anemia

## 2018-09-13 ENCOUNTER — Other Ambulatory Visit: Payer: Self-pay

## 2018-09-13 DIAGNOSIS — D649 Anemia, unspecified: Secondary | ICD-10-CM

## 2018-09-13 NOTE — Progress Notes (Signed)
Ferritin

## 2018-09-15 ENCOUNTER — Other Ambulatory Visit: Payer: Self-pay

## 2018-09-22 DIAGNOSIS — D225 Melanocytic nevi of trunk: Secondary | ICD-10-CM | POA: Diagnosis not present

## 2018-09-22 DIAGNOSIS — D2239 Melanocytic nevi of other parts of face: Secondary | ICD-10-CM | POA: Diagnosis not present

## 2018-09-22 DIAGNOSIS — L821 Other seborrheic keratosis: Secondary | ICD-10-CM | POA: Diagnosis not present

## 2018-09-22 DIAGNOSIS — Z808 Family history of malignant neoplasm of other organs or systems: Secondary | ICD-10-CM | POA: Diagnosis not present

## 2018-10-06 ENCOUNTER — Telehealth: Payer: Self-pay | Admitting: Internal Medicine

## 2018-10-06 NOTE — Telephone Encounter (Signed)
Patient advised she is due for a ferritin.  She will come this Friday

## 2018-10-08 ENCOUNTER — Other Ambulatory Visit (INDEPENDENT_AMBULATORY_CARE_PROVIDER_SITE_OTHER): Payer: BLUE CROSS/BLUE SHIELD

## 2018-10-08 ENCOUNTER — Ambulatory Visit (INDEPENDENT_AMBULATORY_CARE_PROVIDER_SITE_OTHER): Payer: BLUE CROSS/BLUE SHIELD | Admitting: Internal Medicine

## 2018-10-08 DIAGNOSIS — Z23 Encounter for immunization: Secondary | ICD-10-CM | POA: Diagnosis not present

## 2018-10-08 DIAGNOSIS — D649 Anemia, unspecified: Secondary | ICD-10-CM

## 2018-10-08 DIAGNOSIS — Z79899 Other long term (current) drug therapy: Secondary | ICD-10-CM

## 2018-10-08 DIAGNOSIS — Z7960 Long term (current) use of unspecified immunomodulators and immunosuppressants: Secondary | ICD-10-CM

## 2018-10-08 LAB — FERRITIN: Ferritin: 14.3 ng/mL (ref 10.0–291.0)

## 2018-10-13 NOTE — Telephone Encounter (Signed)
Please review labs on her from Friday

## 2018-10-15 NOTE — Progress Notes (Signed)
Ferritin low NL MyChart Needs supplementation again

## 2018-10-18 ENCOUNTER — Other Ambulatory Visit: Payer: Self-pay

## 2018-10-18 ENCOUNTER — Telehealth: Payer: Self-pay

## 2018-10-18 DIAGNOSIS — D649 Anemia, unspecified: Secondary | ICD-10-CM

## 2018-10-18 DIAGNOSIS — K51519 Left sided colitis with unspecified complications: Secondary | ICD-10-CM

## 2018-10-18 DIAGNOSIS — D509 Iron deficiency anemia, unspecified: Secondary | ICD-10-CM

## 2018-10-18 NOTE — Telephone Encounter (Signed)
-----   Message from Gatha Mayer, MD sent at 10/18/2018  1:02 PM EDT ----- Regarding: feraheme x 2 She has low NL ferritin and hx chronic blood loss anemia  Please arrange feraheme x 2 with a CBC and ferritin 1 month after the second feraheme  Thanks

## 2018-10-18 NOTE — Telephone Encounter (Signed)
Patient is scheduled for 11/01/18 and 11/08/18 both at 1:00. Left message for patient to call back

## 2018-10-18 NOTE — Telephone Encounter (Signed)
Patient notified of the appt dates and times.

## 2018-10-21 DIAGNOSIS — M542 Cervicalgia: Secondary | ICD-10-CM | POA: Diagnosis not present

## 2018-10-21 DIAGNOSIS — G518 Other disorders of facial nerve: Secondary | ICD-10-CM | POA: Diagnosis not present

## 2018-10-21 DIAGNOSIS — G43719 Chronic migraine without aura, intractable, without status migrainosus: Secondary | ICD-10-CM | POA: Diagnosis not present

## 2018-10-21 DIAGNOSIS — M791 Myalgia, unspecified site: Secondary | ICD-10-CM | POA: Diagnosis not present

## 2018-10-21 DIAGNOSIS — R51 Headache: Secondary | ICD-10-CM | POA: Diagnosis not present

## 2018-10-25 ENCOUNTER — Encounter (HOSPITAL_COMMUNITY): Payer: BLUE CROSS/BLUE SHIELD

## 2018-11-01 ENCOUNTER — Encounter (HOSPITAL_COMMUNITY): Payer: BLUE CROSS/BLUE SHIELD

## 2018-11-02 ENCOUNTER — Ambulatory Visit
Admission: RE | Admit: 2018-11-02 | Discharge: 2018-11-02 | Disposition: A | Discharge status: Home / Self Care | Payer: BLUE CROSS/BLUE SHIELD | Source: Ambulatory Visit | Attending: Obstetrics & Gynecology | Admitting: Obstetrics & Gynecology

## 2018-11-02 ENCOUNTER — Other Ambulatory Visit: Payer: BLUE CROSS/BLUE SHIELD

## 2018-11-02 ENCOUNTER — Other Ambulatory Visit: Payer: Self-pay | Admitting: Obstetrics & Gynecology

## 2018-11-02 DIAGNOSIS — R922 Inconclusive mammogram: Secondary | ICD-10-CM | POA: Diagnosis not present

## 2018-11-02 DIAGNOSIS — N63 Unspecified lump in unspecified breast: Secondary | ICD-10-CM

## 2018-11-02 DIAGNOSIS — N6311 Unspecified lump in the right breast, upper outer quadrant: Secondary | ICD-10-CM | POA: Diagnosis not present

## 2018-11-04 ENCOUNTER — Ambulatory Visit (HOSPITAL_COMMUNITY)
Admission: RE | Admit: 2018-11-04 | Discharge: 2018-11-04 | Disposition: A | Discharge status: Home / Self Care | Payer: BLUE CROSS/BLUE SHIELD | Source: Ambulatory Visit | Attending: Internal Medicine | Admitting: Internal Medicine

## 2018-11-04 DIAGNOSIS — D649 Anemia, unspecified: Secondary | ICD-10-CM | POA: Diagnosis not present

## 2018-11-04 DIAGNOSIS — K51519 Left sided colitis with unspecified complications: Secondary | ICD-10-CM | POA: Diagnosis not present

## 2018-11-04 DIAGNOSIS — D509 Iron deficiency anemia, unspecified: Secondary | ICD-10-CM | POA: Diagnosis not present

## 2018-11-04 MED ORDER — SODIUM CHLORIDE 0.9 % IV SOLN
510.0000 mg | INTRAVENOUS | Status: DC
  Administered 2018-11-04: 510 mg via INTRAVENOUS
  Filled 2018-11-04: qty 17

## 2018-11-04 MED ORDER — SODIUM CHLORIDE 0.9 % IV SOLN
INTRAVENOUS | Status: DC | PRN
  Administered 2018-11-04: 250 mL via INTRAVENOUS

## 2018-11-04 NOTE — Discharge Instructions (Signed)

## 2018-11-04 NOTE — Progress Notes (Signed)
Patient received Feraheme  via PIV as ordered. Observed for at least 30 minutes post infusion.Tolerated well, vitals stable, discharge instructions given, verbalized understanding. Patient alert, oriented and ambulatory at the time of discharge.

## 2018-11-08 ENCOUNTER — Encounter (HOSPITAL_COMMUNITY): Payer: BLUE CROSS/BLUE SHIELD

## 2018-11-11 ENCOUNTER — Ambulatory Visit (HOSPITAL_COMMUNITY)
Admission: RE | Admit: 2018-11-11 | Discharge: 2018-11-11 | Disposition: A | Discharge status: Home / Self Care | Payer: BLUE CROSS/BLUE SHIELD | Source: Ambulatory Visit | Attending: Internal Medicine | Admitting: Internal Medicine

## 2018-11-11 DIAGNOSIS — D509 Iron deficiency anemia, unspecified: Secondary | ICD-10-CM | POA: Diagnosis not present

## 2018-11-11 DIAGNOSIS — K51519 Left sided colitis with unspecified complications: Secondary | ICD-10-CM | POA: Diagnosis not present

## 2018-11-11 DIAGNOSIS — D649 Anemia, unspecified: Secondary | ICD-10-CM | POA: Diagnosis not present

## 2018-11-11 MED ORDER — SODIUM CHLORIDE 0.9 % IV SOLN
INTRAVENOUS | Status: DC | PRN
  Administered 2018-11-11: 250 mL via INTRAVENOUS

## 2018-11-11 MED ORDER — SODIUM CHLORIDE 0.9 % IV SOLN
510.0000 mg | Freq: Once | INTRAVENOUS | Status: AC
  Administered 2018-11-11: 510 mg via INTRAVENOUS
  Filled 2018-11-11: qty 17

## 2018-11-11 NOTE — Discharge Instructions (Signed)

## 2018-11-11 NOTE — Progress Notes (Signed)
PATIENT CARE CENTER NOTE  Diagnosis: Iron Deficiency Anemia    Provider: Dr. Carlean Purl   Procedure: IV Feraheme    Note: Patient received Feraheme infusion. Monitored patient for 30 minutes post-infusion. Vital signs stable. Discharge instructions given. Patient alert, oriented and ambulatory at discharge.

## 2018-12-02 DIAGNOSIS — L249 Irritant contact dermatitis, unspecified cause: Secondary | ICD-10-CM | POA: Diagnosis not present

## 2018-12-08 DIAGNOSIS — G43719 Chronic migraine without aura, intractable, without status migrainosus: Secondary | ICD-10-CM | POA: Diagnosis not present

## 2018-12-08 DIAGNOSIS — M791 Myalgia, unspecified site: Secondary | ICD-10-CM | POA: Diagnosis not present

## 2018-12-08 DIAGNOSIS — G518 Other disorders of facial nerve: Secondary | ICD-10-CM | POA: Diagnosis not present

## 2018-12-08 DIAGNOSIS — M542 Cervicalgia: Secondary | ICD-10-CM | POA: Diagnosis not present

## 2018-12-14 ENCOUNTER — Other Ambulatory Visit: Payer: Self-pay | Admitting: Internal Medicine

## 2018-12-18 ENCOUNTER — Other Ambulatory Visit: Payer: Self-pay | Admitting: Internal Medicine

## 2019-01-07 ENCOUNTER — Other Ambulatory Visit: Payer: Self-pay

## 2019-01-07 MED ORDER — ADALIMUMAB 40 MG/0.4ML ~~LOC~~ AJKT: 40.0000 mg | AUTO-INJECTOR | SUBCUTANEOUS | 6 refills | Status: DC

## 2019-01-11 ENCOUNTER — Telehealth: Payer: Self-pay | Admitting: Internal Medicine

## 2019-01-11 NOTE — Telephone Encounter (Signed)
Patient has 2 accounts with [facility].  Pharmacist assures me that they have Humira ready to send to the patient.  I spoke with the patient and she is asked to call them back so they can ship it.

## 2019-01-18 DIAGNOSIS — E039 Hypothyroidism, unspecified: Secondary | ICD-10-CM | POA: Diagnosis not present

## 2019-01-20 DIAGNOSIS — G43719 Chronic migraine without aura, intractable, without status migrainosus: Secondary | ICD-10-CM | POA: Diagnosis not present

## 2019-01-20 DIAGNOSIS — G518 Other disorders of facial nerve: Secondary | ICD-10-CM | POA: Diagnosis not present

## 2019-01-20 DIAGNOSIS — M791 Myalgia, unspecified site: Secondary | ICD-10-CM | POA: Diagnosis not present

## 2019-01-20 DIAGNOSIS — M542 Cervicalgia: Secondary | ICD-10-CM | POA: Diagnosis not present

## 2019-01-27 DIAGNOSIS — R8271 Bacteriuria: Secondary | ICD-10-CM | POA: Diagnosis not present

## 2019-01-27 DIAGNOSIS — N3010 Interstitial cystitis (chronic) without hematuria: Secondary | ICD-10-CM | POA: Diagnosis not present

## 2019-02-01 ENCOUNTER — Other Ambulatory Visit: Payer: Self-pay | Admitting: Internal Medicine

## 2019-02-01 NOTE — Telephone Encounter (Signed)
How many refills Sir? Thank you.

## 2019-02-02 NOTE — Telephone Encounter (Signed)
1 year duloxetine refills

## 2019-02-09 ENCOUNTER — Other Ambulatory Visit: Payer: Self-pay | Admitting: Internal Medicine

## 2019-02-09 ENCOUNTER — Other Ambulatory Visit: Payer: Self-pay

## 2019-02-09 DIAGNOSIS — Z7960 Long term (current) use of unspecified immunomodulators and immunosuppressants: Secondary | ICD-10-CM

## 2019-02-09 DIAGNOSIS — D500 Iron deficiency anemia secondary to blood loss (chronic): Secondary | ICD-10-CM

## 2019-02-09 DIAGNOSIS — Z79899 Other long term (current) drug therapy: Secondary | ICD-10-CM

## 2019-02-09 DIAGNOSIS — K51519 Left sided colitis with unspecified complications: Secondary | ICD-10-CM

## 2019-02-09 MED ORDER — ADALIMUMAB 40 MG/0.4ML ~~LOC~~ AJKT: 40.0000 mg | AUTO-INJECTOR | SUBCUTANEOUS | 6 refills | Status: DC

## 2019-02-09 NOTE — Progress Notes (Signed)
quantiferon ordered for June 2020

## 2019-02-15 ENCOUNTER — Other Ambulatory Visit: Payer: Self-pay

## 2019-02-15 MED ORDER — ADALIMUMAB 40 MG/0.4ML ~~LOC~~ AJKT: 40.0000 mg | AUTO-INJECTOR | SUBCUTANEOUS | 6 refills | Status: DC

## 2019-02-21 ENCOUNTER — Telehealth: Payer: Self-pay

## 2019-02-21 NOTE — Telephone Encounter (Signed)
Left her a detailed message to call for a nurse appointment for her Twinrix.

## 2019-02-21 NOTE — Telephone Encounter (Signed)
-----   Message from Larina Bras, [place] sent at 10/08/2018  3:38 PM EDT ----- Patient will need to be scheduled for 3/3 standard twinrix injection around 03/08/19

## 2019-02-22 NOTE — Telephone Encounter (Signed)
Left another message to call me to set up Twinrix. Thought I might catch her at a different time of day.

## 2019-03-01 NOTE — Telephone Encounter (Signed)
I set her up to come 03/14/2019 at 3:00pm for her final Twinrix injection.  She had appointments the week prior to that and couldn't come.

## 2019-03-09 DIAGNOSIS — G518 Other disorders of facial nerve: Secondary | ICD-10-CM | POA: Diagnosis not present

## 2019-03-09 DIAGNOSIS — G43719 Chronic migraine without aura, intractable, without status migrainosus: Secondary | ICD-10-CM | POA: Diagnosis not present

## 2019-03-09 DIAGNOSIS — M791 Myalgia, unspecified site: Secondary | ICD-10-CM | POA: Diagnosis not present

## 2019-03-09 DIAGNOSIS — M542 Cervicalgia: Secondary | ICD-10-CM | POA: Diagnosis not present

## 2019-03-11 ENCOUNTER — Telehealth: Payer: Self-pay | Admitting: Internal Medicine

## 2019-03-11 NOTE — Telephone Encounter (Signed)
Is this okay Dr Carlean Purl?

## 2019-03-11 NOTE — Telephone Encounter (Signed)
I would space it out some wait a week or so after the Humira to do the Twinrix

## 2019-03-11 NOTE — Telephone Encounter (Signed)
Left her a detailed message that I am cancelling her appointment on Monday with [place] for her Twinrix injection. And I told her to call me back next week and we will set it back up.

## 2019-03-17 ENCOUNTER — Telehealth: Payer: Self-pay

## 2019-03-17 NOTE — Telephone Encounter (Signed)
Left Shelly Wilkinson a message for her to call me back and we'll try and see when she's available to get her Twinrix. Dr Carlean Purl said try and do it on a week she doesn't take her Humira. She had an appointment 03/14/2019 for her #3 Twinrix and we cancelled because she was getting her Humira that day.    [name] called back as I was typing this and we have put her on the books to come April 20th at 4:00pm for her #3 Twinrix. We will continue to watch the COVID-19 precautions and she's aware we may have to change this appointment.

## 2019-04-19 DIAGNOSIS — M542 Cervicalgia: Secondary | ICD-10-CM | POA: Diagnosis not present

## 2019-04-19 DIAGNOSIS — M791 Myalgia, unspecified site: Secondary | ICD-10-CM | POA: Diagnosis not present

## 2019-04-19 DIAGNOSIS — G43719 Chronic migraine without aura, intractable, without status migrainosus: Secondary | ICD-10-CM | POA: Diagnosis not present

## 2019-04-19 DIAGNOSIS — G518 Other disorders of facial nerve: Secondary | ICD-10-CM | POA: Diagnosis not present

## 2019-05-03 ENCOUNTER — Other Ambulatory Visit: Payer: BLUE CROSS/BLUE SHIELD

## 2019-05-16 ENCOUNTER — Other Ambulatory Visit: Payer: Self-pay

## 2019-05-16 ENCOUNTER — Ambulatory Visit
Admission: RE | Admit: 2019-05-16 | Discharge: 2019-05-16 | Disposition: A | Discharge status: Home / Self Care | Payer: BLUE CROSS/BLUE SHIELD | Source: Ambulatory Visit | Attending: Obstetrics & Gynecology | Admitting: Obstetrics & Gynecology

## 2019-05-16 ENCOUNTER — Other Ambulatory Visit: Payer: Self-pay | Admitting: Obstetrics & Gynecology

## 2019-05-16 DIAGNOSIS — N63 Unspecified lump in unspecified breast: Secondary | ICD-10-CM

## 2019-05-16 DIAGNOSIS — N6311 Unspecified lump in the right breast, upper outer quadrant: Secondary | ICD-10-CM | POA: Diagnosis not present

## 2019-05-16 DIAGNOSIS — N6002 Solitary cyst of left breast: Secondary | ICD-10-CM | POA: Diagnosis not present

## 2019-06-06 DIAGNOSIS — G43719 Chronic migraine without aura, intractable, without status migrainosus: Secondary | ICD-10-CM | POA: Diagnosis not present

## 2019-06-08 DIAGNOSIS — M542 Cervicalgia: Secondary | ICD-10-CM | POA: Diagnosis not present

## 2019-06-08 DIAGNOSIS — G43719 Chronic migraine without aura, intractable, without status migrainosus: Secondary | ICD-10-CM | POA: Diagnosis not present

## 2019-06-10 DIAGNOSIS — E559 Vitamin D deficiency, unspecified: Secondary | ICD-10-CM | POA: Diagnosis not present

## 2019-06-10 DIAGNOSIS — E039 Hypothyroidism, unspecified: Secondary | ICD-10-CM | POA: Diagnosis not present

## 2019-06-13 ENCOUNTER — Other Ambulatory Visit (INDEPENDENT_AMBULATORY_CARE_PROVIDER_SITE_OTHER): Payer: BC Managed Care – PPO

## 2019-06-13 ENCOUNTER — Ambulatory Visit (INDEPENDENT_AMBULATORY_CARE_PROVIDER_SITE_OTHER): Payer: BC Managed Care – PPO | Admitting: Internal Medicine

## 2019-06-13 ENCOUNTER — Other Ambulatory Visit: Payer: Self-pay

## 2019-06-13 DIAGNOSIS — Z79899 Other long term (current) drug therapy: Secondary | ICD-10-CM | POA: Diagnosis not present

## 2019-06-13 DIAGNOSIS — Z23 Encounter for immunization: Secondary | ICD-10-CM

## 2019-06-13 DIAGNOSIS — K51519 Left sided colitis with unspecified complications: Secondary | ICD-10-CM

## 2019-06-13 DIAGNOSIS — D500 Iron deficiency anemia secondary to blood loss (chronic): Secondary | ICD-10-CM

## 2019-06-13 DIAGNOSIS — Z7960 Long term (current) use of unspecified immunomodulators and immunosuppressants: Secondary | ICD-10-CM

## 2019-06-13 LAB — CBC WITH DIFFERENTIAL/PLATELET
Basophils Absolute: 0 10*3/uL (ref 0.0–0.1)
Basophils Relative: 0.8 % (ref 0.0–3.0)
Eosinophils Absolute: 0.2 10*3/uL (ref 0.0–0.7)
Eosinophils Relative: 5.9 % — ABNORMAL HIGH (ref 0.0–5.0)
HCT: 34.8 % — ABNORMAL LOW (ref 36.0–46.0)
Hemoglobin: 11.7 g/dL — ABNORMAL LOW (ref 12.0–15.0)
Lymphocytes Relative: 46.7 % — ABNORMAL HIGH (ref 12.0–46.0)
Lymphs Abs: 1.7 10*3/uL (ref 0.7–4.0)
MCHC: 33.6 g/dL (ref 30.0–36.0)
MCV: 92.8 fl (ref 78.0–100.0)
Monocytes Absolute: 0.4 10*3/uL (ref 0.1–1.0)
Monocytes Relative: 10.2 % (ref 3.0–12.0)
Neutro Abs: 1.3 10*3/uL — ABNORMAL LOW (ref 1.4–7.7)
Neutrophils Relative %: 36.4 % — ABNORMAL LOW (ref 43.0–77.0)
Platelets: 238 10*3/uL (ref 150.0–400.0)
RBC: 3.75 Mil/uL — ABNORMAL LOW (ref 3.87–5.11)
RDW: 13.3 % (ref 11.5–15.5)
WBC: 3.7 10*3/uL — ABNORMAL LOW (ref 4.0–10.5)

## 2019-06-13 LAB — FERRITIN: Ferritin: 68 ng/mL (ref 10.0–291.0)

## 2019-06-14 ENCOUNTER — Ambulatory Visit: Payer: BC Managed Care – PPO | Admitting: Internal Medicine

## 2019-06-14 NOTE — Progress Notes (Signed)
[place],  Labs so far are okay.  Just some mild abnormalities on the complete blood count and your iron levels are good.  Waiting on the QuantiFERON tuberculosis test and I will follow-up some more.  Warm regards,  CEG

## 2019-06-15 LAB — QUANTIFERON-TB GOLD PLUS
Mitogen-NIL: 7.52 IU/mL
NIL: 0.03 IU/mL
QuantiFERON-TB Gold Plus: NEGATIVE
TB1-NIL: 0 IU/mL
TB2-NIL: 0 IU/mL

## 2019-06-20 ENCOUNTER — Other Ambulatory Visit: Payer: Self-pay

## 2019-06-20 ENCOUNTER — Ambulatory Visit (INDEPENDENT_AMBULATORY_CARE_PROVIDER_SITE_OTHER)
Admission: RE | Admit: 2019-06-20 | Discharge: 2019-06-20 | Disposition: A | Discharge status: Home / Self Care | Payer: BC Managed Care – PPO | Source: Ambulatory Visit | Attending: Internal Medicine | Admitting: Internal Medicine

## 2019-06-20 ENCOUNTER — Telehealth: Payer: Self-pay | Admitting: Internal Medicine

## 2019-06-20 DIAGNOSIS — K5641 Fecal impaction: Secondary | ICD-10-CM | POA: Diagnosis not present

## 2019-06-20 DIAGNOSIS — R1084 Generalized abdominal pain: Secondary | ICD-10-CM | POA: Diagnosis not present

## 2019-06-20 NOTE — Telephone Encounter (Signed)
Spoke to the patient who reports no bowel movement since 6/12 or 6/13. The patient is wondering if the amitriptyline has worsened constipation. On 6/18 the patient started (per Dr. Zadie Rhine message) taking one daily capful MiraLax which resulted in no bowel movement so the patient in addition to taking the MiraLax added bisacodyl sodium stimulant laxative, 3 gel caps daily (6/20, 6/21, and 6/22). She has also continued taking daily Benefiber. The patient reports no abd pain, endorses "gurgling" and noticeable abd distention and firmness. Please advise.

## 2019-06-20 NOTE — Telephone Encounter (Signed)
Abdominal film shows some mildly dilated colon but no bowel obstruction  I think she needs a purge and recommend that she do the following   Try a fleet enema (saline) can purchase in drug store 1 or 2 times and if that gives adequate relief then stop there  If it does not help enough then drink 6 doses of MiraLax over about 2 hours or less  Ask her to let us/me know how this works out regardless - she can call or My Chart essage back

## 2019-06-20 NOTE — Telephone Encounter (Signed)
Spoke to patient  Abdomen is somewhat protuberant - not much pain.  Not hungry, some heartburn, no vomiting  Plan:  DG abdomen ordered for today  Then will decide what is next

## 2019-06-20 NOTE — Telephone Encounter (Signed)
Patient wanted to speak with  Nurse. She stated that she has not had a bowel movement in a week. She has tried otc and the doctors advice as well. She also stated that her stomach is hurting and gurgling.

## 2019-06-20 NOTE — Telephone Encounter (Signed)
Spoke to the patient and relayed Dr. Celesta Aver recommendations. Patient verbalized understanding. Patient will report results tomorrow. Nothing further at the end of the call.

## 2019-06-21 DIAGNOSIS — E559 Vitamin D deficiency, unspecified: Secondary | ICD-10-CM | POA: Diagnosis not present

## 2019-06-21 DIAGNOSIS — E039 Hypothyroidism, unspecified: Secondary | ICD-10-CM | POA: Diagnosis not present

## 2019-06-30 ENCOUNTER — Encounter

## 2019-06-30 ENCOUNTER — Ambulatory Visit (INDEPENDENT_AMBULATORY_CARE_PROVIDER_SITE_OTHER): Payer: BC Managed Care – PPO | Admitting: Internal Medicine

## 2019-06-30 ENCOUNTER — Encounter: Payer: Self-pay | Admitting: Internal Medicine

## 2019-06-30 VITALS — Ht 62.0 in | Wt 121.0 lb

## 2019-06-30 DIAGNOSIS — R194 Change in bowel habit: Secondary | ICD-10-CM | POA: Diagnosis not present

## 2019-06-30 DIAGNOSIS — Z79899 Other long term (current) drug therapy: Secondary | ICD-10-CM

## 2019-06-30 DIAGNOSIS — Z7960 Long term (current) use of unspecified immunomodulators and immunosuppressants: Secondary | ICD-10-CM

## 2019-06-30 DIAGNOSIS — K51519 Left sided colitis with unspecified complications: Secondary | ICD-10-CM | POA: Diagnosis not present

## 2019-06-30 NOTE — Progress Notes (Signed)
TELEHEALTH ENCOUNTER IN SETTING OF COVID-19 PANDEMIC - REQUESTED BY PATIENT SERVICE PROVIDED BY TELEMEDECINE - TYPE:Doxim[employer] AV PATIENT LOCATION: Home PATIENT HAS CONSENTED TO TELEHEALTH VISIT PROVIDER LOCATION: OFFICE REFERRING PROVIDER:N/A PARTICIPANTS OTHER THAN PATIENT:none TIME SPENT ON CALL: 16 minutes    Shelly Wilkinson 48 y.o. [birth year] [id number]  Assessment & Plan:  Change in bowel habits - new constipation ? Due to medication changes ? If related to IBD - stricture,  Neoplasm? Continue daily MiraLax and fiber She will message me next week If not normalizing need to do colonoscopy  Left sided ulcerative colitis (? Crohn's) with complication (Shelly Wilkinson) ? If bowel habit changes are related  Observe a bit more and possible colonoscopy  Long-term use of immunosuppressant medication - Humira Quantiferon UTD and negative   [id number], Shelly Sheffield, MD    Subjective:   Chief Complaint: constipation  HPI Shelly Wilkinson is been struggling with constipation, she was started on amitriptyline because [place] on was not covered on her formulary anymore, and then became severely constipated.  Not very uncomfortable necessarily but was going for days up to a week or so without defecation unless she took something.  I had her use some enemas and that helped and then she had more problems and took a MiraLAX purge and has had bowel movements the last couple of days but not today, she finished that purge on Tuesday.  No significant bleeding other than her hemorrhoidal bleeding.  Her colitis has been under good symptomatic control otherwise.  Interestingly she was diagnosed with macular degeneration and she has read that Shelly Wilkinson is associated with that so she is reluctant to restart that even if she is able to using a prior authorization.  She is currently off the amitriptyline for about a week or 2 but does not feel like her constipation is improved.  TSH was normal she tells me, she follows with Dr.  Buddy Wilkinson. Allergies  Allergen Reactions   Sulfa Antibiotics Hives, Nausea And Vomiting and Other (See Comments)    And fever   Current Meds  Medication Sig   Adalimumab (HUMIRA PEN) 40 MG/0.4ML PNKT Inject 40 mg into the skin every 14 (fourteen) days.   BOTOX 100 UNITS SOLR injection    cholecalciferol (VITAMIN D) 1000 units tablet Take 1,000 Units by mouth daily.   dihydroergotamine (MIGRANAL) 4 MG/ML nasal spray Place 1 spray into the nose as needed.    DULoxetine (CYMBALTA) 60 MG capsule TAKE 1 CAPSULE(60 MG) BY MOUTH DAILY   gabapentin (NEURONTIN) 300 MG capsule Take 300 mg by mouth 2 (two) times daily. Take 333m in the morning and 6032mat night   ibuprofen (ADVIL,MOTRIN) 200 MG tablet Take 200 mg by mouth every 6 (six) hours as needed.   SYNTHROID 75 MCG tablet Take 1 tablet by mouth daily.   tazarotene (TAZORAC) 0.05 % cream Apply topically at bedtime.   Past Medical History:  Diagnosis Date   Allergy    Anemia, iron deficiency 08/25/2016   Anxiety    Arthritis    Chronic anal fissure    Complication of anesthesia    headaches/   06-22-2014 surg. 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   Macular degeneration    Migraines  Thrombosed hemorrhoids    Vitamin D deficiency 11/19/2016   Wears glasses    Past Surgical History:  Procedure Laterality Date   ANAL RECTAL MANOMETRY N/A 09/17/2016   Procedure: ANO RECTAL MANOMETRY;  Surgeon: Gatha Mayer, MD;  [facility];  Service: Endoscopy;  Laterality: N/A;   CHEILECTOMY Right 12/15/2013   Procedure: RIGHT HALLUX CHEILECTOMY;  Surgeon: Wylene Simmer, MD;  Location: [place];  Service: Orthopedics;  Laterality: Right;    CHEILECTOMY Left 02-12-2006   great toe   COLONOSCOPY  10-02-2010   CYSTO/  HYDRODISTENTION/  INSTILLATION THERAPY  03-19-2010   D & C HYSTEROSCOPY /  HYDROTHERMAL ENDOMETRIAL ABLATION/  LEEP  05-29-2011   DILATION AND EVACUATION  2002   EVALUATION UNDER ANESTHESIA WITH ANAL FISTULECTOMY N/A 12/26/2014   Procedure: EXAM UNDER ANESTHESIA ;  Surgeon: Leighton Ruff, MD;  Location[facility];  Service: General;  Laterality: N/A;   HEMORRHOID BANDING  02-16-2014   REPAIR LACERATED RADIAL DIGITAL NERVE LEFT RING FINGER  08-25-2005   SPHINCTEROTOMY N/A 12/26/2014   Procedure: CHEMICAL SPHINCTEROTOMY (BOTOX);  Surgeon: Leighton Ruff, MD;  Location: [facility];  Service: General;  Laterality: N/A;   TRANSANAL HEMORRHOIDAL DEARTERIALIZATION  06-22-2014   Social History   Social History Narrative   Married, 2 children   Third grade teacher   No drugs some alcohol never smoker      family history includes Colitis in her father; Colon cancer (age of onset: 65) in her maternal aunt; Diabetes in her father; Heart attack in her father and paternal grandmother; Heart disease in her father; Hypertension in her father; Lung cancer in her paternal grandfather; Rheum arthritis in her maternal grandmother; Stroke in her father.   Review of Systems As above

## 2019-06-30 NOTE — Assessment & Plan Note (Signed)
Quantiferon UTD and negative

## 2019-06-30 NOTE — Assessment & Plan Note (Signed)
?   Due to medication changes ? If related to IBD - stricture,  Neoplasm? Continue daily MiraLax and fiber She will message me next week If not normalizing need to do colonoscopy

## 2019-06-30 NOTE — Assessment & Plan Note (Signed)
?   If bowel habit changes are related  Observe a bit more and possible colonoscopy

## 2019-07-02 NOTE — Patient Instructions (Signed)
Glad the constipation is better but we need to see if regular MiraLax will take care of things.  Please message me early next week with an update as may need to investigate with a colonoscopy.  I appreciate the opportunity to care for you. Gatha Mayer, MD, [facility]

## 2019-07-05 ENCOUNTER — Other Ambulatory Visit: Payer: Self-pay | Admitting: Internal Medicine

## 2019-07-05 DIAGNOSIS — D3131 Benign neoplasm of right choroid: Secondary | ICD-10-CM | POA: Diagnosis not present

## 2019-07-05 DIAGNOSIS — H2513 Age-related nuclear cataract, bilateral: Secondary | ICD-10-CM | POA: Diagnosis not present

## 2019-07-05 DIAGNOSIS — H3554 Dystrophies primarily involving the retinal pigment epithelium: Secondary | ICD-10-CM | POA: Diagnosis not present

## 2019-07-05 MED ORDER — ESOMEPRAZOLE MAGNESIUM 40 MG PO CPDR: 40.0000 mg | DELAYED_RELEASE_CAPSULE | Freq: Every day | ORAL | 0 refills | Status: DC

## 2019-07-20 DIAGNOSIS — M542 Cervicalgia: Secondary | ICD-10-CM | POA: Diagnosis not present

## 2019-07-20 DIAGNOSIS — G43719 Chronic migraine without aura, intractable, without status migrainosus: Secondary | ICD-10-CM | POA: Diagnosis not present

## 2019-08-09 ENCOUNTER — Ambulatory Visit: Payer: BC Managed Care – PPO | Admitting: Internal Medicine

## 2019-08-09 ENCOUNTER — Other Ambulatory Visit: Payer: Self-pay

## 2019-08-09 ENCOUNTER — Encounter: Payer: Self-pay | Admitting: Internal Medicine

## 2019-08-09 VITALS — BP 90/66 | HR 91 | Temp 98.1°F | Ht 62.5 in | Wt 125.0 lb

## 2019-08-09 DIAGNOSIS — K51519 Left sided colitis with unspecified complications: Secondary | ICD-10-CM | POA: Diagnosis not present

## 2019-08-09 DIAGNOSIS — Z79899 Other long term (current) drug therapy: Secondary | ICD-10-CM

## 2019-08-09 DIAGNOSIS — K642 Third degree hemorrhoids: Secondary | ICD-10-CM

## 2019-08-09 DIAGNOSIS — Z7960 Long term (current) use of unspecified immunomodulators and immunosuppressants: Secondary | ICD-10-CM

## 2019-08-09 NOTE — Progress Notes (Signed)
Shelly Wilkinson 48 y.o. 1971/01/01 [id number]  Assessment & Plan:   Left sided ulcerative colitis (? Crohn's) with complication ([place]) Seems ok and under control. Has bearing upon treatment of anorectal sxs and signs which I think are hemorrhoids as opposed to rectal prolapse.  Long-term use of immunosuppressant medication - Humira No problems - reviewed Covid-19 precautions and she is following  Prolapsed internal hemorrhoids, grade 3 I believe these are her problem and not rectal prolapse. The ? Is what to do especially considering IBD Could need flex sig prior to any intervention. At last colonoscopy 2019 her rectum looked ok re: IBD  [id number], Shelly Sheffield, MD Leighton Ruff, MD   Subjective:   Chief Complaint: ? Rectal prolapse  HPI Shelly Wilkinson on My Chart the other day concerned that she may have rectal prolapse. She has intermittent prolapse sxs- anal protrusion that is circumferential - off and on x months at least. IBD sxs under control, no diarrhea and no sig straining/constipation and no sig bleeding.  She has a long hx hemorrhoids and had surgical excision prior to dx IBS  Intermittently she has prolapsed painful hemorrhoids that do not spontaneously reduce and has dealt with that for a while    Allergies  Allergen Reactions  . Sulfa Antibiotics Hives, Nausea And Vomiting and Other (See Comments)    And fever   Current Meds  Medication Sig  . Adalimumab (HUMIRA PEN) 40 MG/0.4ML PNKT Inject 40 mg into the skin every 14 (fourteen) days.  Marland Kitchen BOTOX 100 UNITS SOLR injection   . cholecalciferol (VITAMIN D) 1000 units tablet Take 1,000 Units by mouth daily.  Marland Kitchen dihydroergotamine (MIGRANAL) 4 MG/ML nasal spray Place 1 spray into the nose as needed.   . DULoxetine (CYMBALTA) 60 MG capsule TAKE 1 CAPSULE(60 MG) BY MOUTH DAILY  . esomeprazole (NEXIUM) 40 MG capsule Take 1 capsule (40 mg total) by mouth daily before breakfast.  . gabapentin (NEURONTIN) 300 MG capsule  Take 300 mg by mouth 2 (two) times daily. Take 335m in the morning and 6036mat night  . ibuprofen (ADVIL,MOTRIN) 200 MG tablet Take 200 mg by mouth every 6 (six) hours as needed.  . Marland KitchenYNTHROID 75 MCG tablet Take 1 tablet by mouth daily.  . tazarotene (TAZORAC) 0.05 % cream Apply topically at bedtime.   Past Medical History:  Diagnosis Date  . Allergy   . Anemia, iron deficiency 08/25/2016  . Anxiety   . Arthritis   . Chronic anal fissure   . Complication of anesthesia    headaches/   06-22-2014 surg. had urinary retention  . Constipation due to outlet dysfunction   . Dyssynergic constipation 10/01/2016   Anorectal mano  . GERD (gastroesophageal reflux disease)   . Hyperlipidemia   . Hypothyroidism   . IBS (irritable bowel syndrome)   . IC (interstitial cystitis)   . Indeterminate colitis 11/18/2016  . Macular degeneration   . Migraines   . Thrombosed hemorrhoids   . Vitamin D deficiency 11/19/2016  . Wears glasses    Past Surgical History:  Procedure Laterality Date  . ANAL RECTAL MANOMETRY N/A 09/17/2016   Procedure: ANO RECTAL MANOMETRY;  Surgeon: CaGatha MayerMD;  [facility];  Service: Endoscopy;  Laterality: N/A;  . CHEILECTOMY Right 12/15/2013   Procedure: [place] Surgeon: JoWylene SimmerMD;  [facility] Service: Orthopedics;  Laterality: Right;  . CHEILECTOMY Left 02-12-2006   great toe  . COLONOSCOPY  10-02-2010  .  CYSTO/  HYDRODISTENTION/  INSTILLATION THERAPY  03-19-2010  . D & C HYSTEROSCOPY /  HYDROTHERMAL ENDOMETRIAL ABLATION/  LEEP  05-29-2011  . DILATION AND EVACUATION  2002  . EVALUATION UNDER ANESTHESIA WITH ANAL FISTULECTOMY N/A 12/26/2014   Procedure: EXAM UNDER ANESTHESIA ;  Surgeon: Leighton Ruff, MD;  Location[facility];  Service: General;  Laterality: N/A;  . HEMORRHOID BANDING  02-16-2014  . REPAIR LACERATED RADIAL DIGITAL NERVE LEFT RING FINGER  08-25-2005  . SPHINCTEROTOMY  N/A 12/26/2014   Procedure: CHEMICAL SPHINCTEROTOMY (BOTOX);  Surgeon: Leighton Ruff, MD;  Location: [facility];  Service: General;  Laterality: N/A;  . TRANSANAL HEMORRHOIDAL DEARTERIALIZATION  06-22-2014   Social History   Social History Narrative   Married, 2 children   Third grade teacher   No drugs some alcohol never smoker      family history includes Colitis in her father; Colon cancer (age of onset: 54) in her maternal aunt; Diabetes in her father; Heart attack in her father and paternal grandmother; Heart disease in her father; Hypertension in her father; Lung cancer in her paternal grandfather; Rheum arthritis in her maternal grandmother; Stroke in her father.   Review of Systems As above  Objective:   Physical Exam BP 90/66   Pulse 91   Temp 98.1 F (36.7 C) (Temporal)   Ht 5' 2.5" (1.588 m)   Wt 125 lb (56.7 kg)   BMI 22.50 kg/m  NAD  Shelly Wilkinson, CMA present  Rectal  Minimal anal tags DRE mild anal stenosis, non-tender and no mass W/Valsalva there is circumferential bulge and descent but no hemorrhoid or rectal prolapse observed   Anoscopy - Gr 2 internal hemorrhoids w/ inflammatory changes all 3 positions

## 2019-08-09 NOTE — Patient Instructions (Addendum)
You have been scheduled for an appointment with Dr Marcello Moores at [facility]. Your appointment is on ____________ at ________________. Please arrive at __________________ for registration. Make certain to bring a list of current medications, including any over the counter medications or vitamins. Also bring your co-pay if you have one as well as your insurance cards. [facility] is located at [address], [address]. Should you need to reschedule your appointment, please contact them at [phone].    I appreciate the opportunity to care for you. Silvano Rusk, MD, [facility]

## 2019-08-09 NOTE — Assessment & Plan Note (Signed)
No problems - reviewed Covid-19 precautions and she is following

## 2019-08-09 NOTE — Assessment & Plan Note (Signed)
I believe these are her problem and not rectal prolapse. The ? Is what to do especially considering IBD Could need flex sig prior to any intervention. At last colonoscopy 2019 her rectum looked ok re: IBD

## 2019-08-09 NOTE — Assessment & Plan Note (Addendum)
Seems ok and under control. Has bearing upon treatment of anorectal sxs and signs which I think are hemorrhoids as opposed to rectal prolapse.

## 2019-08-29 DIAGNOSIS — G43719 Chronic migraine without aura, intractable, without status migrainosus: Secondary | ICD-10-CM | POA: Diagnosis not present

## 2019-08-29 DIAGNOSIS — K642 Third degree hemorrhoids: Secondary | ICD-10-CM | POA: Diagnosis not present

## 2019-09-07 DIAGNOSIS — G43719 Chronic migraine without aura, intractable, without status migrainosus: Secondary | ICD-10-CM | POA: Diagnosis not present

## 2019-09-07 DIAGNOSIS — M542 Cervicalgia: Secondary | ICD-10-CM | POA: Diagnosis not present

## 2019-10-03 DIAGNOSIS — K641 Second degree hemorrhoids: Secondary | ICD-10-CM | POA: Diagnosis not present

## 2019-10-19 DIAGNOSIS — G43719 Chronic migraine without aura, intractable, without status migrainosus: Secondary | ICD-10-CM | POA: Diagnosis not present

## 2019-10-19 DIAGNOSIS — M542 Cervicalgia: Secondary | ICD-10-CM | POA: Diagnosis not present

## 2019-11-12 IMAGING — MG DIGITAL DIAGNOSTIC BILATERAL MAMMOGRAM WITH TOMO AND CAD
6 of 12 series · 6 of 36 positions shown · non-contrast
Comparison: Previous exam(s).

CLINICAL DATA: Follow-up for probably benign mass within the
upper-outer quadrant of the RIGHT breast.

EXAM:
DIGITAL DIAGNOSTIC BILATERAL MAMMOGRAM WITH CAD AND TOMO
ULTRASOUND BILATERAL BREAST

[L MLO synth-2D]
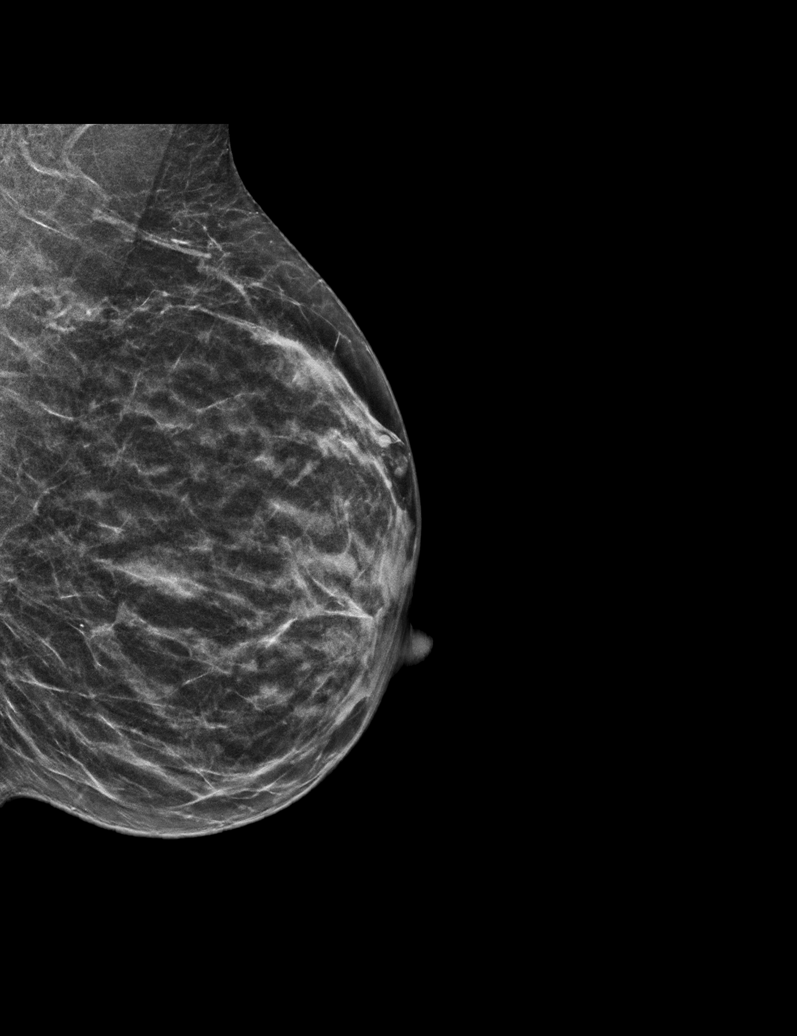

[L CC synth-2D (1 of 2)]
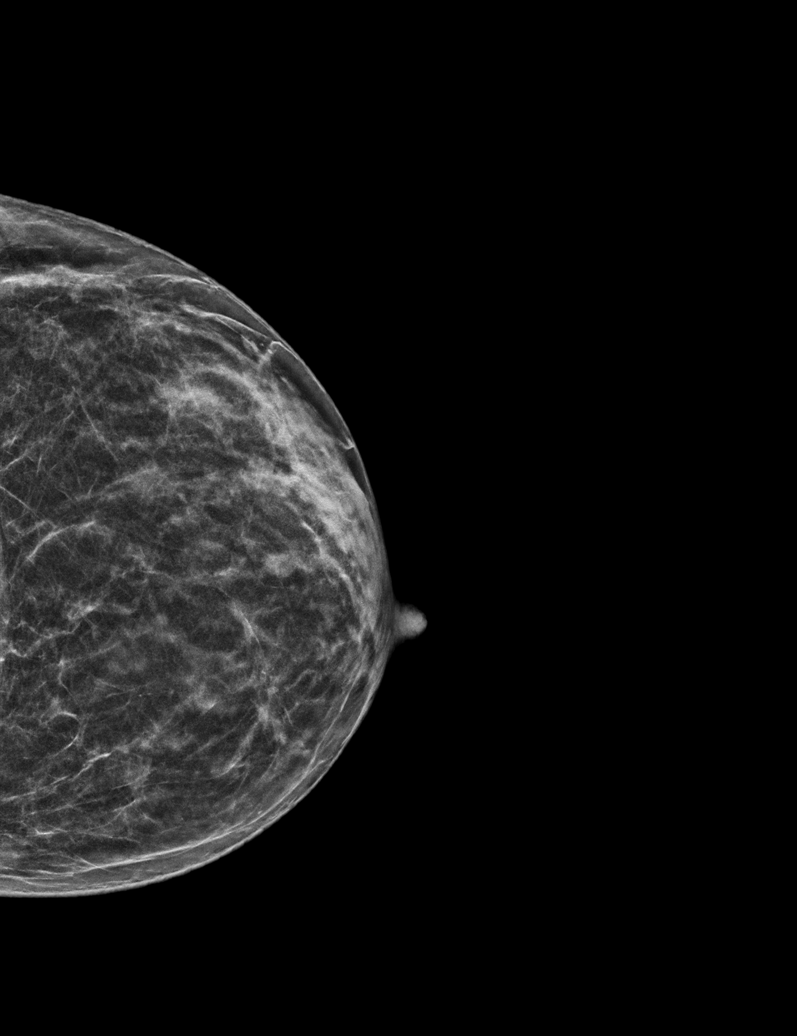

[L CC synth-2D (2 of 2)]
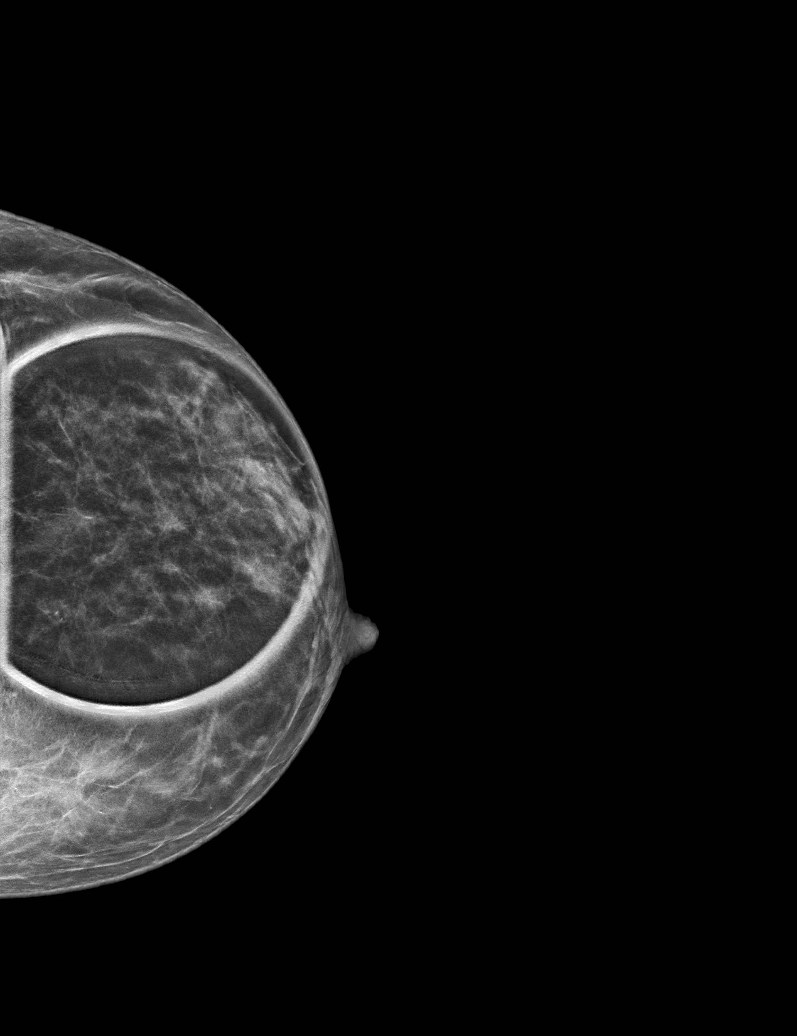

[R CC synth-2D]
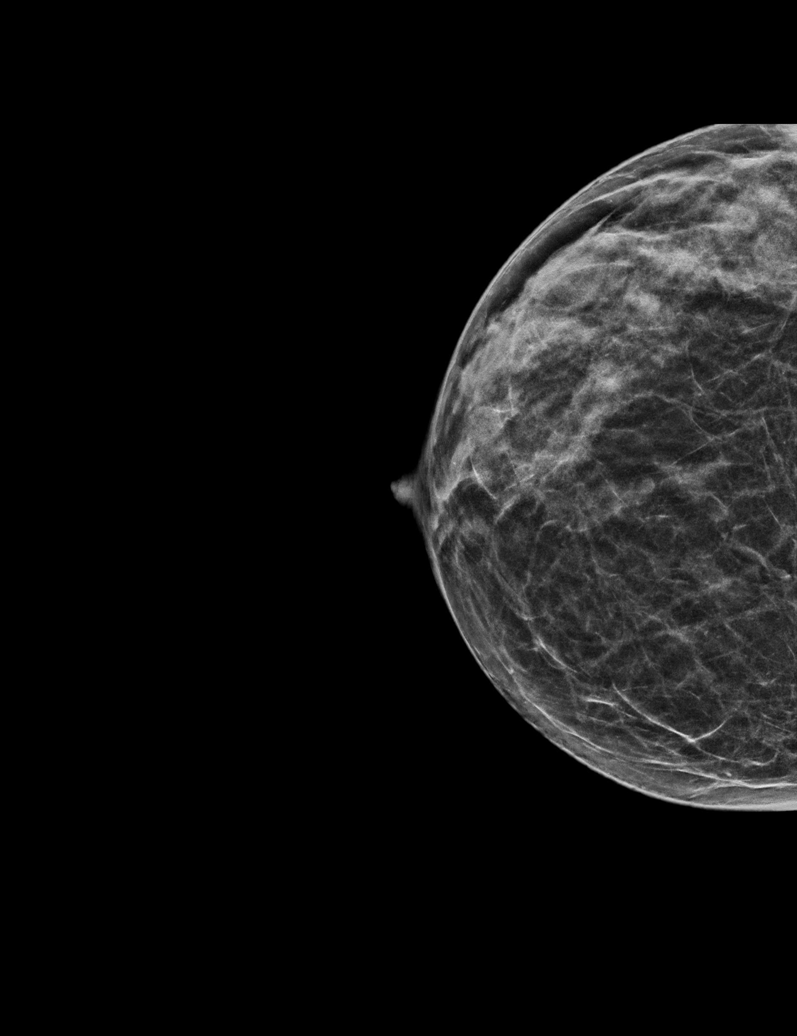

[L ML synth-2D]
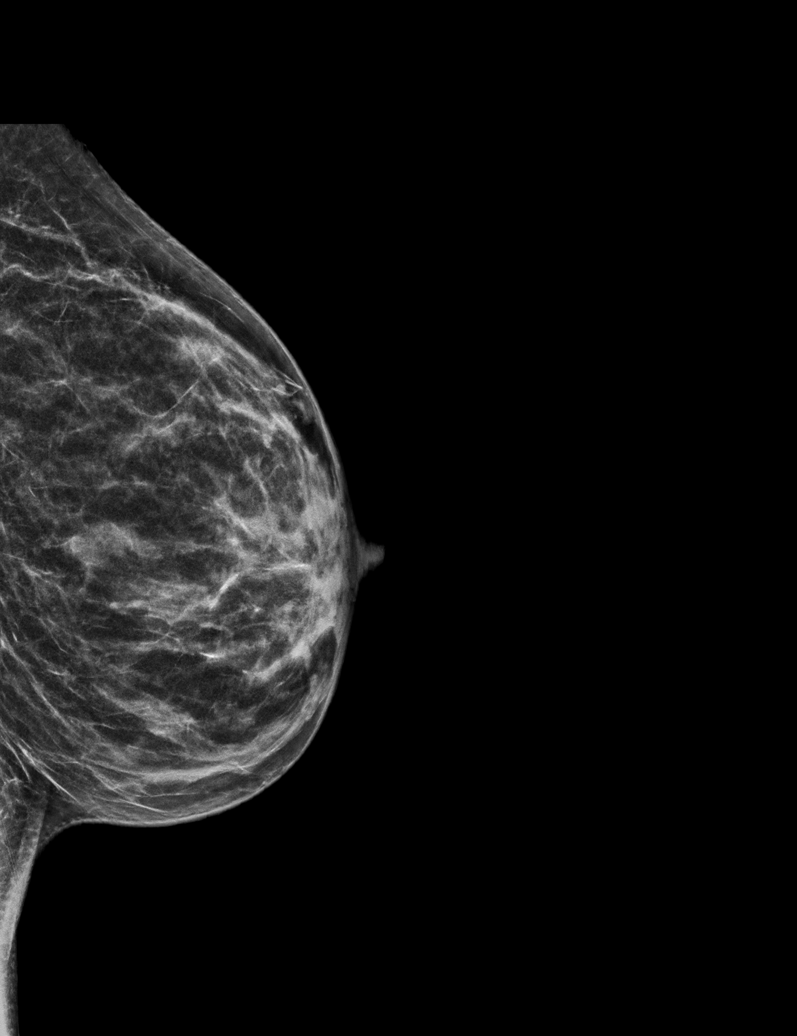

[R MLO synth-2D]
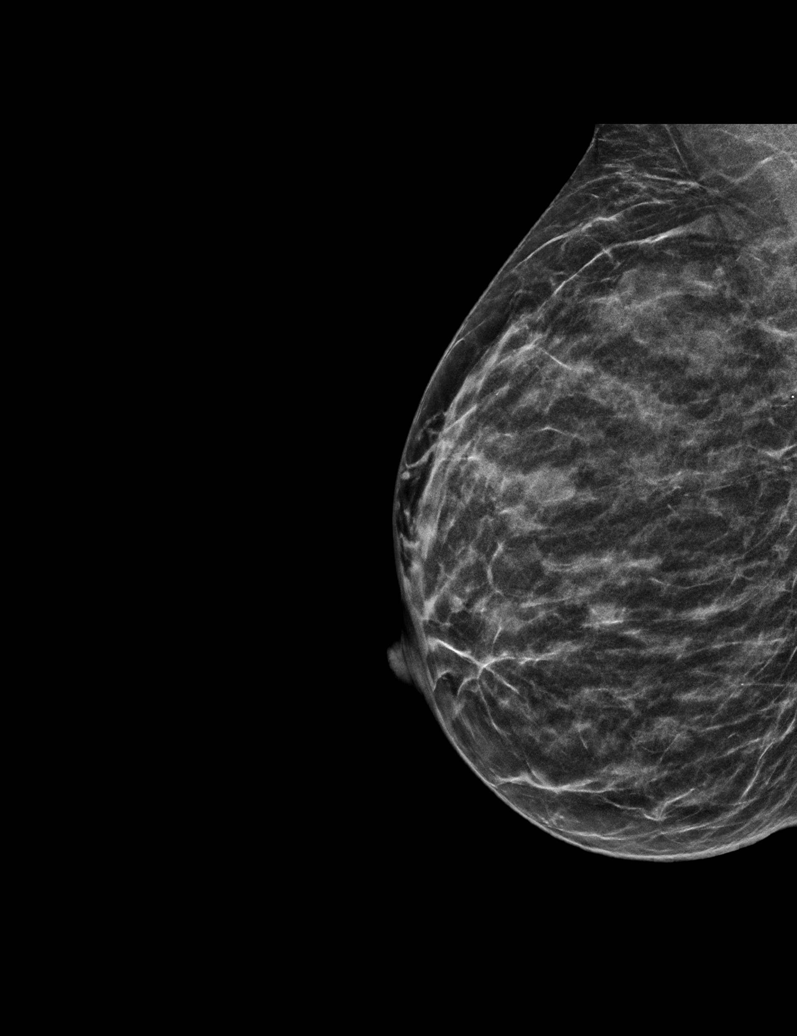

[6 of 36 positions shown; findings below may reference images not displayed]

ACR Breast Density Category c: The breast tissue is heterogeneously
dense, which may obscure small masses.
FINDINGS: The partially obscured mass within the upper-outer quadrant of the
RIGHT breast appears stable. There is an oval circumscribed mass
identified within the LEFT breast, 12 o'clock axis region at
anterior depth, measuring approximately 8 mm greatest dimension.

Mammographic images were processed with CAD.

RIGHT breast: Targeted ultrasound is performed, showing an oval
circumscribed hypoechoic mass at the 10 o'clock axis, 5 cm from the
nipple, measuring 1 x 0.3 x 0.9 cm, not significantly changed in the
interval. There are additional benign cysts within the upper-outer
quadrant of the RIGHT breast, corresponding as incidental findings.

LEFT breast: There are multiple benign cysts within the upper LEFT
breast, some simple and some complicated/clustered, 1 of which
corresponds to the mammographic finding. There is no suspicious
solid or cystic mass identified within the upper LEFT breast.
IMPRESSION: 1. Probably benign mass within the RIGHT breast at the 10 o'clock
axis, 5 cm from the nipple, measuring 1 cm, not significantly
changed. Recommend additional follow-up diagnostic exam in 12 months
to ensure 2 year stability.
2. Multiple benign cysts within each breast, corresponding as
incidental findings.

RECOMMENDATION:
Bilateral diagnostic mammogram, and RIGHT breast ultrasound, in 12
months.

I have discussed the findings and recommendations with the patient.
Results were also provided in writing at the conclusion of the
visit. If applicable, a reminder letter will be sent to the patient
regarding the next appointment.

BI-RADS CATEGORY  3: Probably benign.

## 2019-11-18 DIAGNOSIS — E039 Hypothyroidism, unspecified: Secondary | ICD-10-CM | POA: Diagnosis not present

## 2019-11-29 DIAGNOSIS — G43719 Chronic migraine without aura, intractable, without status migrainosus: Secondary | ICD-10-CM | POA: Diagnosis not present

## 2019-12-07 DIAGNOSIS — M542 Cervicalgia: Secondary | ICD-10-CM | POA: Diagnosis not present

## 2019-12-07 DIAGNOSIS — G43719 Chronic migraine without aura, intractable, without status migrainosus: Secondary | ICD-10-CM | POA: Diagnosis not present

## 2020-01-10 DIAGNOSIS — H2513 Age-related nuclear cataract, bilateral: Secondary | ICD-10-CM | POA: Diagnosis not present

## 2020-01-10 DIAGNOSIS — D3131 Benign neoplasm of right choroid: Secondary | ICD-10-CM | POA: Diagnosis not present

## 2020-01-10 DIAGNOSIS — H3554 Dystrophies primarily involving the retinal pigment epithelium: Secondary | ICD-10-CM | POA: Diagnosis not present

## 2020-01-18 DIAGNOSIS — M542 Cervicalgia: Secondary | ICD-10-CM | POA: Diagnosis not present

## 2020-01-18 DIAGNOSIS — G43719 Chronic migraine without aura, intractable, without status migrainosus: Secondary | ICD-10-CM | POA: Diagnosis not present

## 2020-01-24 ENCOUNTER — Other Ambulatory Visit: Payer: Self-pay | Admitting: Obstetrics & Gynecology

## 2020-01-24 DIAGNOSIS — E039 Hypothyroidism, unspecified: Secondary | ICD-10-CM | POA: Diagnosis not present

## 2020-01-24 DIAGNOSIS — N923 Ovulation bleeding: Secondary | ICD-10-CM

## 2020-01-24 NOTE — Progress Notes (Signed)
Can you call Shelly Wilkinson and give her instructions on getting drawn in [place] at [facility].

## 2020-01-29 ENCOUNTER — Other Ambulatory Visit: Payer: Self-pay | Admitting: Internal Medicine

## 2020-01-30 NOTE — Telephone Encounter (Signed)
May I refill for another year Sir?

## 2020-02-02 ENCOUNTER — Other Ambulatory Visit: Payer: Self-pay | Admitting: *Deleted

## 2020-02-02 DIAGNOSIS — R232 Flushing: Secondary | ICD-10-CM

## 2020-02-02 NOTE — Progress Notes (Signed)
Pt called requesting [place] drawn

## 2020-02-08 DIAGNOSIS — R232 Flushing: Secondary | ICD-10-CM | POA: Diagnosis not present

## 2020-02-08 LAB — FOLLICLE STIMULATING HORMONE: FSH: 5.4 m[IU]/mL

## 2020-02-17 ENCOUNTER — Other Ambulatory Visit: Payer: Self-pay

## 2020-02-17 MED ORDER — HUMIRA (2 PEN) 40 MG/0.4ML ~~LOC~~ AJKT: 40.0000 mg | AUTO-INJECTOR | SUBCUTANEOUS | 6 refills | Status: DC

## 2020-02-23 ENCOUNTER — Ambulatory Visit: Payer: BC Managed Care – PPO | Attending: Internal Medicine

## 2020-02-23 DIAGNOSIS — Z23 Encounter for immunization: Secondary | ICD-10-CM | POA: Insufficient documentation

## 2020-02-23 NOTE — Progress Notes (Signed)
   Covid-19 Vaccination Clinic  Name:  Shelly Wilkinson    MRN: [id number] DOB: 08-Mar-1971  02/23/2020  [name] was observed post Covid-19 immunization for 15 minutes without incidence. She was provided with Vaccine Information Sheet and instruction to access the V-Safe system.   [name] was instructed to call 911 with any severe reactions post vaccine: Marland Kitchen Difficulty breathing  . Swelling of your face and throat  . A fast heartbeat  . A bad rash all over your body  . Dizziness and weakness    Immunizations Administered    Name Date Dose VIS Date Route   Pfizer COVID-19 Vaccine 02/23/2020 11:55 AM 0.3 mL 12/09/2019 Intramuscular   Manufacturer: [place]   Lot: [id number]   [place]: [address]-6

## 2020-02-29 DIAGNOSIS — G43719 Chronic migraine without aura, intractable, without status migrainosus: Secondary | ICD-10-CM | POA: Diagnosis not present

## 2020-03-07 DIAGNOSIS — M542 Cervicalgia: Secondary | ICD-10-CM | POA: Diagnosis not present

## 2020-03-07 DIAGNOSIS — G43719 Chronic migraine without aura, intractable, without status migrainosus: Secondary | ICD-10-CM | POA: Diagnosis not present

## 2020-03-14 ENCOUNTER — Ambulatory Visit: Payer: BC Managed Care – PPO | Attending: Internal Medicine

## 2020-03-14 DIAGNOSIS — Z23 Encounter for immunization: Secondary | ICD-10-CM

## 2020-03-14 NOTE — Progress Notes (Signed)
   Covid-19 Vaccination Clinic  Name:  Shelly Wilkinson    MRN: [id number] DOB: [birth year]  03/14/2020  Shelly Wilkinson was observed post Covid-19 immunization for 15 minutes without incident. She was provided with Vaccine Information Sheet and instruction to access the V-Safe system.   Shelly Wilkinson was instructed to call 911 with any severe reactions post vaccine: Marland Kitchen Difficulty breathing  . Swelling of face and throat  . A fast heartbeat  . A bad rash all over body  . Dizziness and weakness   Immunizations Administered    Name Date Dose VIS Date Route   Pfizer COVID-19 Vaccine 03/14/2020  1:14 PM 0.3 mL 12/09/2019 Intramuscular   Manufacturer: [place]   Lot: [id number]   [place]: [address]-7

## 2020-04-10 DIAGNOSIS — M545 Low back pain: Secondary | ICD-10-CM | POA: Diagnosis not present

## 2020-04-18 DIAGNOSIS — M542 Cervicalgia: Secondary | ICD-10-CM | POA: Diagnosis not present

## 2020-04-18 DIAGNOSIS — G43719 Chronic migraine without aura, intractable, without status migrainosus: Secondary | ICD-10-CM | POA: Diagnosis not present

## 2020-05-14 ENCOUNTER — Other Ambulatory Visit: Payer: Self-pay | Admitting: Obstetrics & Gynecology

## 2020-05-14 DIAGNOSIS — N6310 Unspecified lump in the right breast, unspecified quadrant: Secondary | ICD-10-CM

## 2020-06-01 ENCOUNTER — Other Ambulatory Visit: Payer: BC Managed Care – PPO

## 2020-06-05 DIAGNOSIS — G43719 Chronic migraine without aura, intractable, without status migrainosus: Secondary | ICD-10-CM | POA: Diagnosis not present

## 2020-06-06 DIAGNOSIS — G43719 Chronic migraine without aura, intractable, without status migrainosus: Secondary | ICD-10-CM | POA: Diagnosis not present

## 2020-06-06 DIAGNOSIS — M542 Cervicalgia: Secondary | ICD-10-CM | POA: Diagnosis not present

## 2020-06-15 ENCOUNTER — Ambulatory Visit
Admission: RE | Admit: 2020-06-15 | Discharge: 2020-06-15 | Disposition: A | Discharge status: Home / Self Care | Payer: BC Managed Care – PPO | Source: Ambulatory Visit | Attending: Obstetrics & Gynecology | Admitting: Obstetrics & Gynecology

## 2020-06-15 ENCOUNTER — Other Ambulatory Visit: Payer: Self-pay | Admitting: Obstetrics & Gynecology

## 2020-06-15 ENCOUNTER — Other Ambulatory Visit: Payer: Self-pay

## 2020-06-15 DIAGNOSIS — N6311 Unspecified lump in the right breast, upper outer quadrant: Secondary | ICD-10-CM | POA: Diagnosis not present

## 2020-06-15 DIAGNOSIS — N6310 Unspecified lump in the right breast, unspecified quadrant: Secondary | ICD-10-CM | POA: Diagnosis not present

## 2020-06-15 DIAGNOSIS — R922 Inconclusive mammogram: Secondary | ICD-10-CM | POA: Diagnosis not present

## 2020-06-15 DIAGNOSIS — N6002 Solitary cyst of left breast: Secondary | ICD-10-CM | POA: Diagnosis not present

## 2020-06-15 DIAGNOSIS — N6320 Unspecified lump in the left breast, unspecified quadrant: Secondary | ICD-10-CM

## 2020-06-18 DIAGNOSIS — E039 Hypothyroidism, unspecified: Secondary | ICD-10-CM | POA: Diagnosis not present

## 2020-06-29 DIAGNOSIS — E559 Vitamin D deficiency, unspecified: Secondary | ICD-10-CM | POA: Diagnosis not present

## 2020-06-29 DIAGNOSIS — E039 Hypothyroidism, unspecified: Secondary | ICD-10-CM | POA: Diagnosis not present

## 2020-07-18 DIAGNOSIS — M542 Cervicalgia: Secondary | ICD-10-CM | POA: Diagnosis not present

## 2020-07-18 DIAGNOSIS — G43719 Chronic migraine without aura, intractable, without status migrainosus: Secondary | ICD-10-CM | POA: Diagnosis not present

## 2020-07-23 ENCOUNTER — Other Ambulatory Visit: Payer: Self-pay

## 2020-07-23 ENCOUNTER — Ambulatory Visit (INDEPENDENT_AMBULATORY_CARE_PROVIDER_SITE_OTHER): Payer: BC Managed Care – PPO | Admitting: Obstetrics & Gynecology

## 2020-07-23 ENCOUNTER — Encounter: Payer: Self-pay | Admitting: Obstetrics & Gynecology

## 2020-07-23 VITALS — BP 101/65 | HR 78 | Resp 16 | Ht 64.0 in | Wt 123.0 lb

## 2020-07-23 DIAGNOSIS — H0288B Meibomian gland dysfunction left eye, upper and lower eyelids: Secondary | ICD-10-CM | POA: Diagnosis not present

## 2020-07-23 DIAGNOSIS — N951 Menopausal and female climacteric states: Secondary | ICD-10-CM | POA: Diagnosis not present

## 2020-07-23 DIAGNOSIS — N3010 Interstitial cystitis (chronic) without hematuria: Secondary | ICD-10-CM

## 2020-07-23 DIAGNOSIS — H16223 Keratoconjunctivitis sicca, not specified as Sjogren's, bilateral: Secondary | ICD-10-CM | POA: Diagnosis not present

## 2020-07-23 DIAGNOSIS — Z01419 Encounter for gynecological examination (general) (routine) without abnormal findings: Secondary | ICD-10-CM

## 2020-07-23 DIAGNOSIS — H0288A Meibomian gland dysfunction right eye, upper and lower eyelids: Secondary | ICD-10-CM | POA: Diagnosis not present

## 2020-07-23 NOTE — Progress Notes (Signed)
Subjective:     Shelly Wilkinson is a 49 y.o. female here for a routine exam.  Current complaints: hot flashes; has some spotting every month (none since Feb)  Yulee at the time was 5.  Pt is perimenopausal.    Gynecologic History No LMP recorded. Patient has had an ablation. Contraception: vasectomy Last Pap: 2019. Results were: normal (had diagnostic--RTC in 1 year) Last mammogram: 2021. Results Birads 2 (had diagnostic)  Obstetric History OB History  Gravida Para Term Preterm AB Living  3 2 2   1 2   SAB TAB Ectopic Multiple Live Births  1            # Outcome Date GA Lbr Len/2nd Weight Sex Delivery Anes PTL Lv  3 SAB           2 Term           1 Term              The following portions of the patient's history were reviewed and updated as appropriate: allergies, current medications, past family history, past medical history, past social history, past surgical history and problem list.  Review of Systems Pertinent items noted in HPI and remainder of comprehensive ROS otherwise negative.    Objective:      Vitals:   07/23/20 1059  BP: 101/65  Pulse: 78  Resp: 16  Weight: 123 lb (55.8 kg)  Height: 5' 4"  (1.626 m)   Vitals:  WNL General appearance: alert, cooperative and no distress  HEENT: Normocephalic, without obvious abnormality, atraumatic Eyes: negative Throat: lips, mucosa, and tongue normal; teeth and gums normal  Respiratory: Clear to auscultation bilaterally  CV: Regular rate and rhythm  Breasts:  Normal appearance, no masses or tenderness, no nipple retraction or dimpling  GI: Soft, non-tender; bowel sounds normal; no masses,  no organomegaly  GU: External Genitalia:  Tanner V, no lesion Urethra:  No prolapse   Vagina: Pink, normal rugae, no blood or discharge; 1.5 cm mass neat apex of vagina behind cervix (negative biopsy several years ago)  Cervix: No CMT, no lesion  Uterus:  Normal size and contour, non tender  Adnexa: Normal, no masses, non tender   Musculoskeletal: No edema, redness or tenderness in the calves or thighs  Skin: No lesions or rash  Lymphatic: Axillary adenopathy: none     Psychiatric: Normal mood and behavior        Assessment:    Healthy female exam.    Plan:   Pap smear due in 2022 Stable vaginal mass  Yearly mammograms Sees GI for Inflam. Bowel

## 2020-08-27 DIAGNOSIS — R05 Cough: Secondary | ICD-10-CM | POA: Diagnosis not present

## 2020-08-27 DIAGNOSIS — G43719 Chronic migraine without aura, intractable, without status migrainosus: Secondary | ICD-10-CM | POA: Diagnosis not present

## 2020-08-27 DIAGNOSIS — Z20828 Contact with and (suspected) exposure to other viral communicable diseases: Secondary | ICD-10-CM | POA: Diagnosis not present

## 2020-08-28 DIAGNOSIS — R05 Cough: Secondary | ICD-10-CM | POA: Diagnosis not present

## 2020-08-28 DIAGNOSIS — Z20828 Contact with and (suspected) exposure to other viral communicable diseases: Secondary | ICD-10-CM | POA: Diagnosis not present

## 2020-08-29 ENCOUNTER — Other Ambulatory Visit: Payer: Self-pay | Admitting: Internal Medicine

## 2020-08-29 NOTE — Telephone Encounter (Signed)
Patient needs office visit for additional refills

## 2020-08-29 NOTE — Telephone Encounter (Signed)
Appointment made for November 4th at 3:30pm.

## 2020-08-30 DIAGNOSIS — E559 Vitamin D deficiency, unspecified: Secondary | ICD-10-CM | POA: Diagnosis not present

## 2020-08-30 DIAGNOSIS — E039 Hypothyroidism, unspecified: Secondary | ICD-10-CM | POA: Diagnosis not present

## 2020-09-12 DIAGNOSIS — G43719 Chronic migraine without aura, intractable, without status migrainosus: Secondary | ICD-10-CM | POA: Diagnosis not present

## 2020-09-12 DIAGNOSIS — M542 Cervicalgia: Secondary | ICD-10-CM | POA: Diagnosis not present

## 2020-09-18 DIAGNOSIS — H00012 Hordeolum externum right lower eyelid: Secondary | ICD-10-CM | POA: Diagnosis not present

## 2020-10-24 DIAGNOSIS — G43719 Chronic migraine without aura, intractable, without status migrainosus: Secondary | ICD-10-CM | POA: Diagnosis not present

## 2020-10-24 DIAGNOSIS — M542 Cervicalgia: Secondary | ICD-10-CM | POA: Diagnosis not present

## 2020-10-28 ENCOUNTER — Other Ambulatory Visit: Payer: Self-pay | Admitting: Internal Medicine

## 2020-10-29 ENCOUNTER — Other Ambulatory Visit: Payer: Self-pay | Admitting: Obstetrics & Gynecology

## 2020-10-29 DIAGNOSIS — G43709 Chronic migraine without aura, not intractable, without status migrainosus: Secondary | ICD-10-CM

## 2020-11-01 ENCOUNTER — Other Ambulatory Visit (INDEPENDENT_AMBULATORY_CARE_PROVIDER_SITE_OTHER): Payer: BC Managed Care – PPO

## 2020-11-01 ENCOUNTER — Encounter: Payer: Self-pay | Admitting: Internal Medicine

## 2020-11-01 ENCOUNTER — Ambulatory Visit: Payer: BC Managed Care – PPO | Admitting: Internal Medicine

## 2020-11-01 VITALS — BP 100/66 | HR 65 | Ht 62.5 in | Wt 125.0 lb

## 2020-11-01 DIAGNOSIS — D500 Iron deficiency anemia secondary to blood loss (chronic): Secondary | ICD-10-CM

## 2020-11-01 DIAGNOSIS — Z79899 Other long term (current) drug therapy: Secondary | ICD-10-CM

## 2020-11-01 DIAGNOSIS — K51519 Left sided colitis with unspecified complications: Secondary | ICD-10-CM

## 2020-11-01 DIAGNOSIS — Z7960 Long term (current) use of unspecified immunomodulators and immunosuppressants: Secondary | ICD-10-CM

## 2020-11-01 DIAGNOSIS — K642 Third degree hemorrhoids: Secondary | ICD-10-CM

## 2020-11-01 LAB — COMPREHENSIVE METABOLIC PANEL
ALT: 22 U/L (ref 0–35)
AST: 18 U/L (ref 0–37)
Albumin: 4.3 g/dL (ref 3.5–5.2)
Alkaline Phosphatase: 94 U/L (ref 39–117)
BUN: 8 mg/dL (ref 6–23)
CO2: 28 mEq/L (ref 19–32)
Calcium: 9.6 mg/dL (ref 8.4–10.5)
Chloride: 100 mEq/L (ref 96–112)
Creatinine, Ser: 0.51 mg/dL (ref 0.40–1.20)
GFR: 108.85 mL/min (ref >60.00)
Glucose, Bld: 79 mg/dL (ref 70–99)
Potassium: 3.9 mEq/L (ref 3.5–5.1)
Sodium: 134 mEq/L — ABNORMAL LOW (ref 135–145)
Total Bilirubin: 1.7 mg/dL — ABNORMAL HIGH (ref 0.2–1.2)
Total Protein: 7.4 g/dL (ref 6.0–8.3)

## 2020-11-01 LAB — CBC WITH DIFFERENTIAL/PLATELET
Basophils Absolute: 0.1 10*3/uL (ref 0.0–0.1)
Basophils Relative: 1 % (ref 0.0–3.0)
Eosinophils Absolute: 0.2 10*3/uL (ref 0.0–0.7)
Eosinophils Relative: 2.4 % (ref 0.0–5.0)
HCT: 39.5 % (ref 36.0–46.0)
Hemoglobin: 13.2 g/dL (ref 12.0–15.0)
Lymphocytes Relative: 35.3 % (ref 12.0–46.0)
Lymphs Abs: 2.7 10*3/uL (ref 0.7–4.0)
MCHC: 33.5 g/dL (ref 30.0–36.0)
MCV: 89.3 fl (ref 78.0–100.0)
Monocytes Absolute: 0.6 10*3/uL (ref 0.1–1.0)
Monocytes Relative: 8.3 % (ref 3.0–12.0)
Neutro Abs: 4.1 10*3/uL (ref 1.4–7.7)
Neutrophils Relative %: 53 % (ref 43.0–77.0)
Platelets: 208 10*3/uL (ref 150.0–400.0)
RBC: 4.42 Mil/uL (ref 3.87–5.11)
RDW: 13.6 % (ref 11.5–15.5)
WBC: 7.7 10*3/uL (ref 4.0–10.5)

## 2020-11-01 LAB — FERRITIN: Ferritin: 20.1 ng/mL (ref 10.0–291.0)

## 2020-11-01 MED ORDER — PNEUMOVAX 23 25 MCG/0.5ML IJ INJ: 0.5000 mL | INJECTION | Freq: Once | INTRAMUSCULAR | 0 refills | Status: AC

## 2020-11-01 NOTE — Assessment & Plan Note (Signed)
She is managing with these. Dr. Marcello Moores reluctant to operate again given IBD and quality of life is acceptable. Tertiary referral if persistent issues.

## 2020-11-01 NOTE — Assessment & Plan Note (Addendum)
In clinical remission. Evaluate with labs as below. Continue Humira. Previously had considered colonoscopy to document remission but we will see what the calprotectin tells [place]. Diagnosed in 2017 does not necessarily need routine repeat colonoscopy until 2025-ish. Return in 6 to 12 months.  Note that 2019 colonoscopy demonstrated quiesced sent colitis on biopsies throughout though the only inflammatory changes seen endoscopically were in the left colon.  So maybe she has a universal colitis. Orders Placed This Encounter  Procedures  . Calprotectin, Fecal  . CBC with Differential/Platelet  . Comprehensive metabolic panel  . QuantiFERON-TB Gold Plus

## 2020-11-01 NOTE — Patient Instructions (Signed)
Your provider has requested that you go to the basement level for lab work before leaving today. Press "B" on the elevator. The lab is located at the first door on the left as you exit the elevator.  Due to recent changes in healthcare laws, you may see the results of your imaging and laboratory studies on MyChart before your provider has had a chance to review them.  We understand that in some cases there may be results that are confusing or concerning to you. Not all laboratory results come back in the same time frame and the provider may be waiting for multiple results in order to interpret others.  Please give [place] 48 hours in order for your provider to thoroughly review all the results before contacting the office for clarification of your results.   Normal BMI (Body Mass Index- based on height and weight) is between 19 and 25. Your BMI today is Body mass index is 21.46 kg/m. Marland Kitchen Please consider follow up  regarding your BMI with your Primary Care Provider.   We are providing you with printed rx for the pneumovax 23 vaccine to take to your pharmacy.   You may also get the Shingles vaccine, no rx needed.   I appreciate the opportunity to care for you. Silvano Rusk, MD, [facility]

## 2020-11-01 NOTE — Assessment & Plan Note (Signed)
CBC ferritin today

## 2020-11-01 NOTE — Assessment & Plan Note (Signed)
Check QuantiFERON today. Pneumovax recommended printed prescription given also recommend Shingrix vaccine.

## 2020-11-01 NOTE — Progress Notes (Signed)
Shelly Wilkinson 49 y.o. August 19, 1971 [id number]  Assessment & Plan:   Left sided ulcerative colitis (? Crohn's) with complication ([place]) In clinical remission. Evaluate with labs as below. Continue Humira. Previously had considered colonoscopy to document remission but we will see what the calprotectin tells [place]. Diagnosed in 2017 does not necessarily need routine repeat colonoscopy until 2025-ish. Return in 6 to 12 months.  Note that 2019 colonoscopy demonstrated quiesced sent colitis on biopsies throughout though the only inflammatory changes seen endoscopically were in the left colon.  So maybe she has a universal colitis. Orders Placed This Encounter  Procedures  . Calprotectin, Fecal  . CBC with Differential/Platelet  . Comprehensive metabolic panel  . QuantiFERON-TB Gold Plus      Long-term use of immunosuppressant medication - Humira Check QuantiFERON today. Pneumovax recommended printed prescription given also recommend Shingrix vaccine.  Anemia, iron deficiency CBC ferritin today  Prolapsed internal hemorrhoids, grade 3 She is managing with these. Dr. Marcello Wilkinson reluctant to operate again given IBD and quality of life is acceptable. Tertiary referral if persistent issues.  I appreciate the opportunity to care for her.  HQ:PRFFMBW, Shelly Sheffield, MD   Subjective:   Chief Complaint: Follow-up of inflammatory bowel disease, left-sided colitis, on Humira  HPI Shelly Wilkinson is a 49 year old woman with left-sided inflammatory bowel disease probably indeterminate and symptomatic hemorrhoids. Originally thought to have that IBS turned out to have inflammatory bowel disease and is now doing very well on Humira. 40 mg every other week since May 2018.She reports that she is doing so well is almost like she never had inflammatory bowel disease.  Formed stools.  Hemorrhoids do bother her some but Dr. Marcello Wilkinson told her she was reluctant to operate again and that if it was a real problem would consider  going to [place].   Last colonoscopy January 2019 mild inflammatory changes rectum sigmoid and descending colon with normal appearing mucosa otherwise.  Quiescent colitis diagnosed right and left colon on pathology.  Last seen in last labs in 2020.  CBC normal ferritin was above normal.  Due for QuantiFERON. Allergies  Allergen Reactions  . Sulfa Antibiotics Hives, Nausea And Vomiting and Other (See Comments)    And fever   Current Meds  Medication Sig  . BOTOX 100 UNITS SOLR injection   . cholecalciferol (VITAMIN D) 1000 units tablet Take 1,000 Units by mouth daily.  . DULoxetine (CYMBALTA) 60 MG capsule TAKE 1 CAPSULE(60 MG) BY MOUTH DAILY  . gabapentin (NEURONTIN) 300 MG capsule Take 300 mg by mouth 2 (two) times daily. Take 357m in the morning and 60106mat night  . HUMIRA PEN 40 MG/0.4ML PNKT INJECT 40 MG UNDER THE SKIN (SUBCUTANEOUS INJECTION) EVERY 14 DAYS  . ibuprofen (ADVIL,MOTRIN) 200 MG tablet Take 200 mg by mouth every 6 (six) hours as needed. Patient takes as needed  . SYNTHROID 88 MCG tablet Take by mouth.  . tazarotene (TAZORAC) 0.05 % cream Apply topically at bedtime.  . Marland KitchenBRELVY 50 MG TABS Take 1 tablet by mouth 2 (two) times daily as needed. Patient takes as needed   Past Medical History:  Diagnosis Date  . Allergy   . Anemia, iron deficiency 08/25/2016  . Anxiety   . Arthritis   . Chronic anal fissure   . Complication of anesthesia    headaches/   06-22-2014 surg. had urinary retention  . Constipation due to outlet dysfunction   . Dyssynergic constipation 10/01/2016   Anorectal mano  . GERD (gastroesophageal reflux disease)   .  Hyperlipidemia   . Hypothyroidism   . IBS (irritable bowel syndrome)   . IC (interstitial cystitis)   . Indeterminate colitis 11/18/2016  . Macular degeneration   . Migraines   . Thrombosed hemorrhoids   . Vitamin D deficiency 11/19/2016  . Wears glasses    Past Surgical History:  Procedure Laterality Date  . ANAL RECTAL MANOMETRY  N/A 09/17/2016   Procedure: ANO RECTAL MANOMETRY;  Surgeon: Shelly Mayer, MD;  Location: [facility];  Service: Endoscopy;  Laterality: N/A;  . CHEILECTOMY Right 12/15/2013   Procedure: [place];  Surgeon: Shelly Simmer, MD;  Location: [place] [facility];  Service: Orthopedics;  Laterality: Right;  . CHEILECTOMY Left 02-12-2006   great toe  . COLONOSCOPY  10-02-2010  . CYSTO/  HYDRODISTENTION/  INSTILLATION THERAPY  03-19-2010  . D & C HYSTEROSCOPY /  HYDROTHERMAL ENDOMETRIAL ABLATION/  LEEP  05-29-2011  . DILATION AND EVACUATION  2002  . EVALUATION UNDER ANESTHESIA WITH ANAL FISTULECTOMY N/A 12/26/2014   Procedure: EXAM UNDER ANESTHESIA ;  Surgeon: Shelly Ruff, MD;  Location: [facility];  Service: General;  Laterality: N/A;  . HEMORRHOID BANDING  02-16-2014  . REPAIR LACERATED RADIAL DIGITAL NERVE LEFT RING FINGER  08-25-2005  . SPHINCTEROTOMY N/A 12/26/2014   Procedure: CHEMICAL SPHINCTEROTOMY (BOTOX);  Surgeon: Shelly Ruff, MD;  Location: [facility];  Service: General;  Laterality: N/A;  . 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   Review of Systems As above  Objective:   Physical Exam @BP  100/66   Pulse 65   Ht 5' 2.5" (1.588 m)   Wt 125 lb (56.7 kg)   BMI 22.50 kg/m @  General:  NAD Eyes:   anicteric Lungs:  clear Heart::  S1S2 no rubs, murmurs or gallops Abdomen:  soft and nontender, BS+ Ext:   no  edema, cyanosis or clubbing

## 2020-11-03 LAB — QUANTIFERON-TB GOLD PLUS
Mitogen-NIL: >10 IU/mL
NIL: 0.01 IU/mL
QuantiFERON-TB Gold Plus: NEGATIVE
TB1-NIL: 0.01 IU/mL
TB2-NIL: 0.01 IU/mL

## 2020-11-06 ENCOUNTER — Encounter: Payer: Self-pay | Admitting: Radiology

## 2020-11-21 ENCOUNTER — Other Ambulatory Visit: Payer: Self-pay | Admitting: *Deleted

## 2020-11-21 ENCOUNTER — Telehealth: Payer: BC Managed Care – PPO | Admitting: Physician Assistant

## 2020-11-21 MED ORDER — BOTOX 200 UNITS IJ SOLR: 200.0000 [IU] | Freq: Once | INTRAMUSCULAR | 0 refills | Status: AC

## 2020-11-28 DIAGNOSIS — G43909 Migraine, unspecified, not intractable, without status migrainosus: Secondary | ICD-10-CM | POA: Diagnosis not present

## 2020-11-30 ENCOUNTER — Ambulatory Visit (INDEPENDENT_AMBULATORY_CARE_PROVIDER_SITE_OTHER): Payer: BC Managed Care – PPO | Admitting: Physician Assistant

## 2020-11-30 ENCOUNTER — Encounter: Payer: Self-pay | Admitting: Physician Assistant

## 2020-11-30 ENCOUNTER — Other Ambulatory Visit: Payer: Self-pay

## 2020-11-30 VITALS — BP 116/76 | HR 96 | Wt 127.0 lb

## 2020-11-30 DIAGNOSIS — M62838 Other muscle spasm: Secondary | ICD-10-CM

## 2020-11-30 DIAGNOSIS — G43009 Migraine without aura, not intractable, without status migrainosus: Secondary | ICD-10-CM | POA: Diagnosis not present

## 2020-11-30 MED ORDER — ONABOTULINUMTOXINA 100 UNITS IJ SOLR
200.0000 [IU] | Freq: Once | INTRAMUSCULAR | Status: AC
Start: 2020-11-30 — End: 2020-11-30
  Administered 2020-11-30: 200 [IU] via INTRAMUSCULAR

## 2020-11-30 MED ORDER — CYCLOBENZAPRINE HCL 10 MG PO TABS: 10.0000 mg | ORAL_TABLET | Freq: Three times a day (TID) | ORAL | 1 refills | Status: DC | PRN

## 2020-11-30 MED ORDER — NURTEC 75 MG PO TBDP: 75.0000 mg | ORAL_TABLET | ORAL | 11 refills | Status: DC

## 2020-11-30 NOTE — Patient Instructions (Addendum)
Flexeril/Cyclobenzaprine: Use 1/2 to 1 tab in the evening to help with headache and/or muscle spasm.  If it does not cause too much sedation, it can be utilized during the day for headache and/or muscle spasm.  Botulinum Toxin Injection Botulinum toxin injection is a shot that is given to soften lines and wrinkles in the face. The medicine works by weakening the muscles in the face. When muscles become weak, wrinkles are reduced. Tell a doctor about:  Any allergies you have. It is important to tell the doctor if you are allergic to eggs.  All the medicines you take. This includes: ? Antibiotics. ? Vitamins. ? Herbs. ? Eye drops. ? Creams. ? Over-the-counter medicines.  Any problems you or your family have had with the use of anesthetics.  Any blood problems you have.  Any surgeries you have had.  Any medical conditions you have.  If you are pregnant or may be pregnant.  If you are breastfeeding. What are the risks? Generally, this is a safe procedure, but there may be problems. Common problems include:  Pain.  Swelling.  Bruising. Other problems include:  Pain in the jaw.  Pain in the neck.  Allergic reaction to the shot.  Damage to nearby structures or organs.  Drooping eyebrow or eyelid.  Double vision.  Blurred vision.  Changes in voice or speech.  Inability to close one or both eyes.  Trouble swallowing.  Infection. What happens before the procedure?  Do not drink any alcohol as told by your doctor.  Ask your doctor about: ? Changing or stopping your regular medicines. This is important if you take diabetes medicines or blood thinners. ? Taking medicines such as aspirin and ibuprofen. These medicines can thin your blood. Do not take these medicines before the procedure unless your doctor tells you to take them. ? Taking over-the-counter medicines, vitamins, herbs, and supplements. What happens during the procedure?   The area to be treated may  be: ? Chilled with ice. ? Numbed with medicine (local anesthetic).  Your doctor will give you some shots of the toxin. How many shots you will get depends on: ? Your condition. ? Which area is being treated. The procedure may vary among doctors and hospitals. What can I expect after the procedure? After these shots, it is common to have:  Pain, soreness, swelling, itching, or redness on your face.  Small bruises from the needle.  Bumps or marks from the needle.  Loss of feeling in areas where the needle entered the body.  Headache. This is rare. It may take up to 14 days for the treatment to work. It may work sooner for some people. Results will last for about 3-4 months. Follow-up treatment will make this last longer. Follow these instructions at home: Relieving pain and discomfort   Take over-the-counter and prescription medicines only as told by your doctor.  If told, put ice to the injected area: ? Put ice in a plastic bag. ? Place a towel between your skin and the bag. ? Leave the ice on for 20 minutes, 2-3 times per day. Activity Do not do any activities that take a lot of effort. Avoid these activities for 2 hours after the procedure or for as long as told by your doctor. This includes:  Lifting heavy items.  Working out.  Doing activities that make your heart beat faster. General instructions  Do not lie down for 4 hours after treatment or as told by your doctor.  Do not  rub the area where you got the shot. This can spread the toxin.  Depending on where the shot was given: ? Your doctor may ask you not to move your muscles in that area. Follow what you are told. ? Your doctor may tell you to frown or squint regularly. You may need to do these exercises every 15 minutes for 1 hour after the shots. Follow what the doctor tells you. ? Do not get laser treatments, facials, or facial massages for 1-2 weeks after the procedure or for as long as told by your doctor.  ? Keep all follow-up visits with your doctor. This is important. Contact a doctor if:  You have a headache that gets worse.  You have a fever. Get help right away if:  You have signs of an allergic reaction. These include: ? An itchy rash or welts. ? Wheezing or shortness of breath. ? Dizziness.  Your eyelids start to get droopy or swollen.  You have trouble speaking.  You feel short of breath or have trouble breathing.  You start to have trouble swallowing. Summary  Botulinum toxin injection is a shot that is given to soften lines and wrinkles in the face.  This is a safe procedure. However, some problems may occur, including infection, drooping eyelid, double or blurred vision, and trouble swallowing.  Follow your doctor's instructions before the procedure. These may include stopping or changing medicines or stopping alcohol use.  You will be told how to care for yourself after the procedure.  Get help right away if you have chest pain, fever, or an allergic reaction. Also, get help right away if you have problems with vision, trouble speaking, or you feel short of breath, become hoarse, or have trouble swallowing. This information is not intended to replace advice given to you by your health care provider. Make sure you discuss any questions you have with your health care provider. Document Revised: 08/05/2018 Document Reviewed: 08/05/2018 Elsevier Patient Education  [place].

## 2020-11-30 NOTE — Progress Notes (Signed)
S: Pt in office today for Botox injections. This is our first encounter together but patient has been receiving Botox injections from Dr. Domingo Cocking at the [facility] for years.  I have reviewed her records from the [facility] and noted her most recent Botox to have been done on 09/12/2020 with 200 units utilized.  She did not want to get off schedule of her Botox and was just now able to get an appointment. Of note, she was also receiving regularly scheduled nerve blocks and trigger point injections.  Records from Dr. Domingo Cocking were reviewed prior to today's visit.    She regularly sees Dr. Gala Romney of our practice.   Patient reports her migraines are significantly improved on Botox and she does not wish to make changes to this treatment strategy.  She does however want to discontinue the gabapentin.  She has been on it for years , noting minimal benefit and she continues to feel it makes her tired.   Her migraines are described as moderate to severe in intensity, located on the right side: over the eye and them moving back to the entire right side of head.  There is throbbing as well as pressure.  The headache lasts all day to multiple days without adequate treatment.  There is nausea and both lights and noises bother her.  Physical activity makes it worse.  No aura is described.     Number of headache days in the last 4 weeks: Severe: 2 Moderate: 4 Mild: 4 No Headache 18  O: BP 116/76   Pulse 96   Wt 127 lb (57.6 kg)   BMI 22.86 kg/m    Botox Procedure Note 200 unit Vial of Botox was :Supplied by Patient Lot # [id number] Exp 08/2023  Botox Dosing by [employer] for Chronic Migraine   Injection Sites for Migraines  Botox 200 units was injected using the dosage in the table above in the pattern shown above. The additional units were injected into the muscles of the back noted to be in spasm.  A: Migraine  Muscle spasm   P: Botox 200 units injected today. None wasted. Rx given for  Cyclobenzaprine.  Pt may utilize this medication to aide with muscle spasm and headache at home.   Samples and rx provided for Nurtec.  Pt may use every other day for prevention of migraine.   Taper and discontinue Gabapentin, eliminating one daily tablet each week until off.  Should she not tolerate discontinuation, she is to contact the office.  RTC 3 Months.

## 2020-12-12 ENCOUNTER — Encounter: Payer: Self-pay | Admitting: *Deleted

## 2021-01-02 ENCOUNTER — Other Ambulatory Visit: Payer: Self-pay

## 2021-01-02 MED ORDER — HUMIRA (2 PEN) 40 MG/0.4ML ~~LOC~~ AJKT: 40.0000 mg | AUTO-INJECTOR | SUBCUTANEOUS | 1 refills | Status: DC

## 2021-01-03 ENCOUNTER — Telehealth: Payer: Self-pay | Admitting: Internal Medicine

## 2021-01-03 NOTE — Telephone Encounter (Signed)
Inbound call from [facility] from pharmacy stating Humira medication requires prior authorization please.  Paperwork was faxed to [phone] for it.  Can also call it in; reference#:[id number].

## 2021-01-03 NOTE — Telephone Encounter (Signed)
Patient has new insurance and her rx has been sent to [facility].  Prior auth that was sent by [facility] has been faxed.

## 2021-01-11 ENCOUNTER — Other Ambulatory Visit: Payer: Self-pay | Admitting: Physician Assistant

## 2021-01-11 MED ORDER — BOTOX 200 UNITS IJ SOLR: 200.0000 [IU] | INTRAMUSCULAR | 3 refills | Status: DC

## 2021-01-23 ENCOUNTER — Other Ambulatory Visit: Payer: No Typology Code available for payment source

## 2021-01-23 DIAGNOSIS — K51519 Left sided colitis with unspecified complications: Secondary | ICD-10-CM

## 2021-01-24 ENCOUNTER — Other Ambulatory Visit: Payer: Self-pay | Admitting: Internal Medicine

## 2021-01-24 NOTE — Telephone Encounter (Signed)
Please advise Sir, thank you. 

## 2021-01-26 LAB — CALPROTECTIN, FECAL: Calprotectin, Fecal: <16 ug/g (ref 0–120)

## 2021-03-22 ENCOUNTER — Encounter: Payer: Self-pay | Admitting: Physician Assistant

## 2021-03-22 ENCOUNTER — Other Ambulatory Visit: Payer: Self-pay

## 2021-03-22 ENCOUNTER — Ambulatory Visit (INDEPENDENT_AMBULATORY_CARE_PROVIDER_SITE_OTHER): Payer: No Typology Code available for payment source | Admitting: Physician Assistant

## 2021-03-22 VITALS — BP 116/76 | HR 85 | Wt 129.0 lb

## 2021-03-22 DIAGNOSIS — G43009 Migraine without aura, not intractable, without status migrainosus: Secondary | ICD-10-CM

## 2021-03-22 DIAGNOSIS — M62838 Other muscle spasm: Secondary | ICD-10-CM

## 2021-03-22 MED ORDER — ONABOTULINUMTOXINA 100 UNITS IJ SOLR
200.0000 [IU] | Freq: Once | INTRAMUSCULAR | Status: AC
Start: 2021-03-22 — End: 2021-03-22
  Administered 2021-03-22: 200 [IU] via INTRAMUSCULAR

## 2021-03-22 NOTE — Progress Notes (Signed)
S: Pt in office today for Botox injections. Her Botox has been working very well for migraine prevention. Additionally she started Nurtec every other day for prevention after last visit.  She has noted significant improvement with this addition - even with the sudden and unexpected passing of her father shortly after starting the nurtec.  She notes only 1 moderate headache and 5 mild headaches in the prior 28 days.  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 20 units wasted  A: Migraine  Muscle spasm   P: Botox 180 units injected today for migraine prevention.  Also continue nurtec  QOD for prevention.  RTC roughly 5 Months as we will see if the botox is still necessary.

## 2021-06-11 ENCOUNTER — Other Ambulatory Visit: Payer: Self-pay

## 2021-06-11 DIAGNOSIS — Z23 Encounter for immunization: Secondary | ICD-10-CM

## 2021-06-20 ENCOUNTER — Other Ambulatory Visit: Payer: Self-pay | Admitting: Internal Medicine

## 2021-06-21 ENCOUNTER — Other Ambulatory Visit: Payer: Self-pay | Admitting: Internal Medicine

## 2021-06-21 MED ORDER — PREDNISONE 10 MG PO TABS: ORAL_TABLET | ORAL | 0 refills | Status: DC

## 2021-07-19 ENCOUNTER — Other Ambulatory Visit: Payer: Self-pay | Admitting: Obstetrics & Gynecology

## 2021-07-19 DIAGNOSIS — Z1231 Encounter for screening mammogram for malignant neoplasm of breast: Secondary | ICD-10-CM

## 2021-07-24 ENCOUNTER — Ambulatory Visit
Admission: RE | Admit: 2021-07-24 | Discharge: 2021-07-24 | Disposition: A | Discharge status: Home / Self Care | Payer: No Typology Code available for payment source | Source: Ambulatory Visit | Attending: Obstetrics & Gynecology | Admitting: Obstetrics & Gynecology

## 2021-07-24 ENCOUNTER — Other Ambulatory Visit: Payer: Self-pay

## 2021-07-24 DIAGNOSIS — Z1231 Encounter for screening mammogram for malignant neoplasm of breast: Secondary | ICD-10-CM

## 2021-08-16 ENCOUNTER — Ambulatory Visit (INDEPENDENT_AMBULATORY_CARE_PROVIDER_SITE_OTHER): Payer: BC Managed Care – PPO | Admitting: Physician Assistant

## 2021-08-16 ENCOUNTER — Encounter: Payer: Self-pay | Admitting: Physician Assistant

## 2021-08-16 ENCOUNTER — Other Ambulatory Visit: Payer: Self-pay

## 2021-08-16 VITALS — BP 110/75 | HR 76

## 2021-08-16 DIAGNOSIS — G43009 Migraine without aura, not intractable, without status migrainosus: Secondary | ICD-10-CM

## 2021-08-16 MED ORDER — NURTEC 75 MG PO TBDP: 75.0000 mg | ORAL_TABLET | ORAL | 11 refills | Status: DC

## 2021-08-16 MED ORDER — CYCLOBENZAPRINE HCL 10 MG PO TABS: 10.0000 mg | ORAL_TABLET | Freq: Three times a day (TID) | ORAL | 1 refills | Status: DC | PRN

## 2021-08-16 NOTE — Patient Instructions (Signed)
Migraine Headache A migraine headache is an intense, throbbing pain on one side or both sides of the head. Migraine headaches may also cause other symptoms, such as nausea, vomiting, and sensitivity to light and noise. A migraine headache can last from 4 hours to 3 days. Talk with your doctor about what things may bring on (trigger) your migraine headaches. What are the causes? The exact cause of this condition is not known. However, a migraine may be caused when nerves in the brain become irritated and release chemicals that cause inflammation of blood vessels. This inflammation causes pain. This condition may be triggered or caused by: Drinking alcohol. Smoking. Taking medicines, such as: Medicine used to treat chest pain (nitroglycerin). Birth control pills. Estrogen. Certain blood pressure medicines. Eating or drinking products that contain nitrates, glutamate, aspartame, or tyramine. Aged cheeses, chocolate, or caffeine may also be triggers. Doing physical activity. Other things that may trigger a migraine headache include: Menstruation. Pregnancy. Hunger. Stress. Lack of sleep or too much sleep. Weather changes. Fatigue. What increases the risk? The following factors may make you more likely to experience migraine headaches: Being a certain age. This condition is more common in people who are 28-11 years old. Being female. Having a family history of migraine headaches. Being Caucasian. Having a mental health condition, such as depression or anxiety. Being obese. What are the signs or symptoms? The main symptom of this condition is pulsating or throbbing pain. This pain may: Happen in any area of the head, such as on one side or both sides. Interfere with daily activities. Get worse with physical activity. Get worse with exposure to bright lights or loud noises. Other symptoms may include: Nausea. Vomiting. Dizziness. General sensitivity to bright lights, loud noises, or  smells. Before you get a migraine headache, you may get warning signs (an aura). An aura may include: Seeing flashing lights or having blind spots. Seeing bright spots, halos, or zigzag lines. Having tunnel vision or blurred vision. Having numbness or a tingling feeling. Having trouble talking. Having muscle weakness. Some people have symptoms after a migraine headache (postdromal phase), such as: Feeling tired. Difficulty concentrating. How is this diagnosed? A migraine headache can be diagnosed based on: Your symptoms. A physical exam. Tests, such as: CT scan or an MRI of the head. These imaging tests can help rule out other causes of headaches. Taking fluid from the spine (lumbar puncture) and analyzing it (cerebrospinal fluid analysis, or CSF analysis). How is this treated? This condition may be treated with medicines that: Relieve pain. Relieve nausea. Prevent migraine headaches. Treatment for this condition may also include: Acupuncture. Lifestyle changes like avoiding foods that trigger migraine headaches. Biofeedback. Cognitive behavioral therapy. Follow these instructions at home: Medicines Take over-the-counter and prescription medicines only as told by your health care provider. Ask your health care provider if the medicine prescribed to you: Requires you to avoid driving or using heavy machinery. Can cause constipation. You may need to take these actions to prevent or treat constipation: Drink enough fluid to keep your urine pale yellow. Take over-the-counter or prescription medicines. Eat foods that are high in fiber, such as beans, whole grains, and fresh fruits and vegetables. Limit foods that are high in fat and processed sugars, such as fried or sweet foods. Lifestyle Do not drink alcohol. Do not use any products that contain nicotine or tobacco, such as cigarettes, e-cigarettes, and chewing tobacco. If you need help quitting, ask your health care  provider. Get at least 8  hours of sleep every night. Find ways to manage stress, such as meditation, deep breathing, or yoga. General instructions     Keep a journal to find out what may trigger your migraine headaches. For example, write down: What you eat and drink. How much sleep you get. Any change to your diet or medicines. If you have a migraine headache: Avoid things that make your symptoms worse, such as bright lights. It may help to lie down in a dark, quiet room. Do not drive or use heavy machinery. Ask your health care provider what activities are safe for you while you are experiencing symptoms. Keep all follow-up visits as told by your health care provider. This is important. Contact a health care provider if: You develop symptoms that are different or more severe than your usual migraine headache symptoms. You have more than 15 headache days in one month. Get help right away if: Your migraine headache becomes severe. Your migraine headache lasts longer than 72 hours. You have a fever. You have a stiff neck. You have vision loss. Your muscles feel weak or like you cannot control them. You start to lose your balance often. You have trouble walking. You faint. You have a seizure. Summary A migraine headache is an intense, throbbing pain on one side or both sides of the head. Migraines may also cause other symptoms, such as nausea, vomiting, and sensitivity to light and noise. This condition may be treated with medicines and lifestyle changes. You may also need to avoid certain things that trigger a migraine headache. Keep a journal to find out what may trigger your migraine headaches. Contact your health care provider if you have more than 15 headache days in a month or you develop symptoms that are different or more severe than your usual migraine headache symptoms. This information is not intended to replace advice given to you by your health care provider. Make sure  you discuss any questions you have with your healthcare provider. Document Revised: 04/08/2019 Document Reviewed: 01/27/2019 Elsevier Patient Education  [place].

## 2021-08-16 NOTE — Progress Notes (Signed)
History:  Shelly Wilkinson is a 50 y.o. [id number] who presents to clinic today for headache followup.  She is doing well on Nurtec.  There are weeks she needs it daily but most of the time she uses it every other day.  She has had a few headaches that responded to ibuprofen.  She was on a vacation and was worried the higher altitude would increase her headaches but the Nurtec worked well even in this higher risk environment.  She is happy and optimistic about her headaches.   School is starting next week and she has been having workdays this week.  She anticipates a good year ahead.  Her boys are both out of the house for school.  All is well.     HIT6:46 Number of days in the last 4 weeks with:  Severe headache: 0 Moderate headache: 0 Mild headache: 7  No headache: 21   Past Medical History:  Diagnosis Date   Allergy    Anemia, iron deficiency 08/25/2016   Anxiety    Arthritis    Chronic anal fissure    Complication of anesthesia    headaches/   06-22-2014 surg. 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   Macular degeneration    Migraines    Thrombosed hemorrhoids    Vitamin D deficiency 11/19/2016   Wears glasses     Social History   Socioeconomic History   Marital status: Married    Spouse name: Not on file   Number of children: 2   Years of education: Not on file   Highest education level: Not on file  Occupational History   Occupation: Associate Professor: [employer]  Tobacco Use   Smoking status: Never   Smokeless tobacco: Never  Vaping Use   Vaping Use: Never used  Substance and Sexual Activity   Alcohol use: Yes    Comment: rarely   Drug use: No   Sexual activity: Yes    Partners: Male     Birth control/protection: Surgical    Comment: ablation  Other Topics Concern   Not on file  Social History Narrative   Married, 2 children - sons one [place] one [place]  grade teacher   No drugs some alcohol never smoker      Social Determinants of Radio broadcast assistant Strain: Not on file  Food Insecurity: Not on file  Transportation Needs: Not on file  Physical Activity: Not on file  Stress: Not on file  Social Connections: Not on file  Intimate Partner Violence: Not on file    Family History  Problem Relation Age of Onset   Diabetes Father    Heart disease Father    Heart attack Father    Hypertension Father    Colitis Father    Stroke Father    Colon cancer Maternal Aunt 65   Lung cancer Paternal Grandfather    Heart attack Paternal Grandmother    Rheum arthritis Maternal Grandmother     Allergies  Allergen Reactions   Sulfa Antibiotics Hives, Nausea And Vomiting and Other (See Comments)    And fever    Current Outpatient Medications on File Prior  to Visit  Medication Sig Dispense Refill   cholecalciferol (VITAMIN D) 1000 units tablet Take 1,000 Units by mouth daily.     cyclobenzaprine (FLEXERIL) 10 MG tablet Take 1 tablet (10 mg total) by mouth every 8 (eight) hours as needed for muscle spasms. 40 tablet 1   DULoxetine (CYMBALTA) 60 MG capsule TAKE 1 CAPSULE(60 MG) BY MOUTH DAILY 90 capsule 3   Rimegepant Sulfate (NURTEC) 75 MG TBDP Take 75 mg by mouth every other day. 16 tablet 11   SYNTHROID 88 MCG tablet Take by mouth.     cycloSPORINE (RESTASIS) 0.05 % ophthalmic emulsion 1 drop 2 (two) times daily.     HUMIRA PEN 40 MG/0.4ML PNKT INJECT 40 MG UNDER THE SKIN EVERY 14 DAYS (Patient not taking: Reported on 08/16/2021) 6 each 12   ibuprofen (ADVIL,MOTRIN) 200 MG tablet Take 200 mg by mouth every 6 (six) hours as needed. Patient takes as needed     predniSONE (DELTASONE) 10 MG tablet Take as directed (Patient not taking: Reported on 08/16/2021)  100 tablet 0   tazarotene (TAZORAC) 0.05 % cream Apply topically at bedtime.     No current facility-administered medications on file prior to visit.     Review of Systems:  All pertinent positive/negative included in HPI, all other review of systems are negative   Objective:  Physical Exam BP 110/75   Pulse 76  CONSTITUTIONAL: Well-developed, well-nourished female in no acute distress.  EYES: EOM intact ENT: Normocephalic CARDIOVASCULAR: Regular rate  RESPIRATORY: Normal rate.  MUSCULOSKELETAL: Normal ROM SKIN: Warm, dry without erythema  NEUROLOGICAL: Alert, oriented, CN II-XII grossly intact, Appropriate balance PSYCH: Normal behavior, mood   Assessment & Plan:  Assessment: Migraines - improved and stable  Plan: Continue with current plan.  Nurtec working great for prevention and acute therapy. Continue.  Samples provided. Cyclobenzaprine PRN.  Follow-up in 12 months or sooner PRN  Lacie Draft 08/16/2021 9:16 AM  has experienced a condition that typically results in 1-2 months of physical disability but may result in mental disability that may continue for an indeterminate amount of time.

## 2021-08-29 ENCOUNTER — Telehealth: Payer: Self-pay

## 2021-08-29 DIAGNOSIS — H15109 Unspecified episcleritis, unspecified eye: Secondary | ICD-10-CM

## 2021-08-29 HISTORY — DX: Unspecified episcleritis, unspecified eye: H15.109

## 2021-08-29 MED ORDER — HUMIRA-CD/UC/HS STARTER 80 MG/0.8ML ~~LOC~~ AJKT: 160.0000 mg | AUTO-INJECTOR | Freq: Once | SUBCUTANEOUS | 0 refills | Status: DC

## 2021-08-29 MED ORDER — HUMIRA (2 PEN) 40 MG/0.4ML ~~LOC~~ AJKT: 40.0000 mg | AUTO-INJECTOR | SUBCUTANEOUS | 6 refills | Status: DC

## 2021-08-29 NOTE — Telephone Encounter (Signed)
I left a message for the patient to call to discuss restarting Humira.  Rx sent to specialty pharmacy.  Will require new prior auth.  Current for every 14 days.

## 2021-08-29 NOTE — Telephone Encounter (Signed)
-----   Message from Gatha Mayer, MD sent at 08/28/2021  4:44 PM EDT ----- Regarding: restart Humira Please see the My Chart messaging - she needs to restart Humira with loading doses first 2 injections then 40 mg weekly  Her insurance has changed and is spelled out in My Chart messages  Have her make a November f/u also please  CEG

## 2021-09-03 ENCOUNTER — Other Ambulatory Visit: Payer: Self-pay

## 2021-09-03 DIAGNOSIS — D3131 Benign neoplasm of right choroid: Secondary | ICD-10-CM | POA: Diagnosis not present

## 2021-09-03 DIAGNOSIS — H35363 Drusen (degenerative) of macula, bilateral: Secondary | ICD-10-CM | POA: Diagnosis not present

## 2021-09-03 DIAGNOSIS — H2513 Age-related nuclear cataract, bilateral: Secondary | ICD-10-CM | POA: Diagnosis not present

## 2021-09-03 DIAGNOSIS — H3554 Dystrophies primarily involving the retinal pigment epithelium: Secondary | ICD-10-CM | POA: Diagnosis not present

## 2021-09-03 MED ORDER — HUMIRA (2 PEN) 40 MG/0.4ML ~~LOC~~ AJKT: 40.0000 mg | AUTO-INJECTOR | SUBCUTANEOUS | 6 refills | Status: DC

## 2021-09-03 MED ORDER — HUMIRA-CD/UC/HS STARTER 80 MG/0.8ML ~~LOC~~ AJKT: 160.0000 mg | AUTO-INJECTOR | Freq: Once | SUBCUTANEOUS | 0 refills | Status: DC

## 2021-09-03 NOTE — Telephone Encounter (Signed)
Received a fax from [facility] asking that I forward RX to [facility].  I sent insurance information to [facility] and left a message for the patient to contact [facility] with the new insurance cards.

## 2021-09-06 ENCOUNTER — Other Ambulatory Visit: Payer: Self-pay

## 2021-09-06 MED ORDER — HUMIRA (2 PEN) 40 MG/0.4ML ~~LOC~~ AJKT: 40.0000 mg | AUTO-INJECTOR | SUBCUTANEOUS | 6 refills | Status: DC

## 2021-09-06 MED ORDER — HUMIRA-CD/UC/HS STARTER 80 MG/0.8ML ~~LOC~~ AJKT: 160.0000 mg | AUTO-INJECTOR | Freq: Once | SUBCUTANEOUS | 0 refills | Status: DC

## 2021-09-06 NOTE — Telephone Encounter (Signed)
Message sent to patient inquiring if they sent her meds.  No prior auth information has been recieved

## 2021-09-12 NOTE — Telephone Encounter (Signed)
I spoke with [facility] to inquire about status.  "Orders were scanned into their system on 9/14".  She will send prior auth when they have processed the rx.

## 2021-09-17 MED ORDER — HUMIRA-CD/UC/HS STARTER 80 MG/0.8ML ~~LOC~~ AJKT: 160.0000 mg | AUTO-INJECTOR | Freq: Once | SUBCUTANEOUS | 0 refills | Status: DC

## 2021-09-17 MED ORDER — HUMIRA (2 PEN) 40 MG/0.4ML ~~LOC~~ AJKT: 40.0000 mg | AUTO-INJECTOR | SUBCUTANEOUS | 6 refills | Status: DC

## 2021-09-25 DIAGNOSIS — Z23 Encounter for immunization: Secondary | ICD-10-CM | POA: Diagnosis not present

## 2021-10-04 ENCOUNTER — Encounter: Payer: Self-pay | Admitting: Internal Medicine

## 2021-10-18 ENCOUNTER — Other Ambulatory Visit: Payer: Self-pay | Admitting: Physician Assistant

## 2021-10-18 ENCOUNTER — Telehealth: Payer: Self-pay | Admitting: Radiology

## 2021-10-18 MED ORDER — NURTEC 75 MG PO TBDP: 75.0000 mg | ORAL_TABLET | ORAL | 11 refills | Status: DC

## 2021-10-18 NOTE — Telephone Encounter (Signed)
Called and left voicemail for patient with Headache f/u with [facility] on 12/13/21 @ 9:30

## 2021-10-31 DIAGNOSIS — R61 Generalized hyperhidrosis: Secondary | ICD-10-CM | POA: Diagnosis not present

## 2021-10-31 DIAGNOSIS — R632 Polyphagia: Secondary | ICD-10-CM | POA: Diagnosis not present

## 2021-10-31 DIAGNOSIS — E559 Vitamin D deficiency, unspecified: Secondary | ICD-10-CM | POA: Diagnosis not present

## 2021-10-31 DIAGNOSIS — E039 Hypothyroidism, unspecified: Secondary | ICD-10-CM | POA: Diagnosis not present

## 2021-11-05 ENCOUNTER — Other Ambulatory Visit: Payer: Self-pay

## 2021-11-05 DIAGNOSIS — K51519 Left sided colitis with unspecified complications: Secondary | ICD-10-CM

## 2021-11-05 MED ORDER — HUMIRA (2 PEN) 40 MG/0.4ML ~~LOC~~ AJKT: 40.0000 mg | AUTO-INJECTOR | SUBCUTANEOUS | 6 refills | Status: DC

## 2021-11-05 MED ORDER — HUMIRA 40 MG/0.8ML ~~LOC~~ PSKT: PREFILLED_SYRINGE | SUBCUTANEOUS | 11 refills | Status: DC

## 2021-11-05 NOTE — Telephone Encounter (Signed)
Spoke with pt in regard to the Pt Advise request that was sent. Pt stated that she has already taken the Starter Dose of the Humira. Pt states that she took a shot two weeks ago and will take her last shot that she has today.  [facility] had sent a PA to our office requesting that a new prescription be sent for the maintenance dose. Prescription faxed to [facility].

## 2021-11-05 NOTE — Telephone Encounter (Signed)
Left message for pt to call back  °

## 2021-11-06 ENCOUNTER — Telehealth: Payer: Self-pay

## 2021-11-06 NOTE — Telephone Encounter (Signed)
Information faxed to [facility] after speaking with pt. Spoke with [facility]. [facility] states that everything is good. Only thing to do now is for pt to call and schedule the medication. Detailed Voice Message left on pt cell phone.

## 2021-11-08 DIAGNOSIS — R61 Generalized hyperhidrosis: Secondary | ICD-10-CM | POA: Diagnosis not present

## 2021-11-08 DIAGNOSIS — E161 Other hypoglycemia: Secondary | ICD-10-CM | POA: Diagnosis not present

## 2021-11-08 DIAGNOSIS — R632 Polyphagia: Secondary | ICD-10-CM | POA: Diagnosis not present

## 2021-11-08 DIAGNOSIS — E039 Hypothyroidism, unspecified: Secondary | ICD-10-CM | POA: Diagnosis not present

## 2021-11-28 ENCOUNTER — Other Ambulatory Visit: Payer: Self-pay | Admitting: Internal Medicine

## 2021-11-28 DIAGNOSIS — R61 Generalized hyperhidrosis: Secondary | ICD-10-CM | POA: Diagnosis not present

## 2021-11-28 DIAGNOSIS — E161 Other hypoglycemia: Secondary | ICD-10-CM

## 2021-11-28 DIAGNOSIS — R632 Polyphagia: Secondary | ICD-10-CM | POA: Diagnosis not present

## 2021-11-28 DIAGNOSIS — E039 Hypothyroidism, unspecified: Secondary | ICD-10-CM | POA: Diagnosis not present

## 2021-12-03 ENCOUNTER — Other Ambulatory Visit: Payer: BC Managed Care – PPO

## 2021-12-03 DIAGNOSIS — D3131 Benign neoplasm of right choroid: Secondary | ICD-10-CM | POA: Diagnosis not present

## 2021-12-03 DIAGNOSIS — H2513 Age-related nuclear cataract, bilateral: Secondary | ICD-10-CM | POA: Diagnosis not present

## 2021-12-03 DIAGNOSIS — H3554 Dystrophies primarily involving the retinal pigment epithelium: Secondary | ICD-10-CM | POA: Diagnosis not present

## 2021-12-10 ENCOUNTER — Encounter: Payer: Self-pay | Admitting: Internal Medicine

## 2021-12-10 DIAGNOSIS — E162 Hypoglycemia, unspecified: Secondary | ICD-10-CM

## 2021-12-10 HISTORY — DX: Hypoglycemia, unspecified: E16.2

## 2021-12-11 ENCOUNTER — Other Ambulatory Visit (HOSPITAL_COMMUNITY): Payer: Self-pay | Admitting: Internal Medicine

## 2021-12-11 ENCOUNTER — Other Ambulatory Visit: Payer: BC Managed Care – PPO

## 2021-12-11 DIAGNOSIS — E161 Other hypoglycemia: Secondary | ICD-10-CM

## 2021-12-13 ENCOUNTER — Encounter: Payer: Self-pay | Admitting: Physician Assistant

## 2021-12-13 ENCOUNTER — Other Ambulatory Visit: Payer: Self-pay

## 2021-12-13 ENCOUNTER — Telehealth (INDEPENDENT_AMBULATORY_CARE_PROVIDER_SITE_OTHER): Payer: BC Managed Care – PPO | Admitting: Physician Assistant

## 2021-12-13 DIAGNOSIS — G43009 Migraine without aura, not intractable, without status migrainosus: Secondary | ICD-10-CM

## 2021-12-13 DIAGNOSIS — M62838 Other muscle spasm: Secondary | ICD-10-CM

## 2021-12-13 NOTE — Progress Notes (Signed)
Virtual Visit to F/U on HA's  Pt states she has been doing better.

## 2021-12-13 NOTE — Progress Notes (Signed)
[facility] HEADACHE VISIT ENCOUNTER NOTE  Provider location: [facility] [place] [facility]   Patient location: Home  I connected with Maanasa Aderhold Alper on 12/13/21 at  9:30 AM EST by MyChart video and verified that I am speaking with the correct person using two identifiers.    I discussed the limitations, risks, security and privacy concerns of performing an evaluation and management service by telephone and the availability of in person appointments. I also discussed with the patient that there may be a patient responsible charge related to this service. The patient expressed understanding and agreed to proceed.   History:  Makella Buckingham is a 50 y.o. [phone] female being evaluated today for headache.. She had a tough period during the fall with an increase headache.  This my have been related to the start of the school year OR weather realted changes.  She was needing to use the nurtec nearly every day for several weeks.  The nurtec works to take the headache away within 2 hours but it would come back the following day.  The quality has not changed.  She developed covid last week but her headaches have maintained without significant change. She wants to continue current regimen.  She sometimes uses a portion of the cyclobenzaprine and finds it helpful. She is having a CT of her pancreas after noticing changes in her blood sugar.  Dr. Buddy Duty follows this.        Past Medical History:  Diagnosis Date   Allergy    Anemia, iron deficiency 08/25/2016   Anxiety    Arthritis    Choroidal nevus    Chronic anal fissure    Complication of anesthesia    headaches/   06-22-2014 surg. 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   Macular degeneration    Migraines    Thrombosed hemorrhoids    Vitamin D deficiency 11/19/2016   Wears glasses    Past Surgical History:  Procedure Laterality Date   ANAL RECTAL MANOMETRY N/A 09/17/2016   Procedure: ANO RECTAL MANOMETRY;  Surgeon: Gatha Mayer, MD;  Location: [facility];  Service: Endoscopy;  Laterality: N/A;   CHEILECTOMY Right 12/15/2013   Procedure: RIGHT HALLUX CHEILECTOMY;  Surgeon: Wylene Simmer, MD;  Location: [facility];  Service: Orthopedics;  Laterality: Right;   CHEILECTOMY Left 02-12-2006   great toe   COLONOSCOPY  10-02-2010   CYSTO/  HYDRODISTENTION/  INSTILLATION THERAPY  03-19-2010   D & C HYSTEROSCOPY /  HYDROTHERMAL ENDOMETRIAL ABLATION/  LEEP  05-29-2011   DILATION AND EVACUATION  2002   EVALUATION UNDER ANESTHESIA WITH ANAL FISTULECTOMY N/A 12/26/2014   Procedure: EXAM UNDER ANESTHESIA ;  Surgeon: Leighton Ruff, MD;  Location: [facility];  Service: General;  Laterality: N/A;   HEMORRHOID BANDING  02-16-2014   REPAIR LACERATED RADIAL DIGITAL NERVE LEFT RING FINGER  08-25-2005   SPHINCTEROTOMY N/A 12/26/2014   Procedure: CHEMICAL SPHINCTEROTOMY (BOTOX);  Surgeon: Leighton Ruff, MD;  Location: [facility];  Service: General;  Laterality: N/A;   TRANSANAL HEMORRHOIDAL DEARTERIALIZATION  06-22-2014   The following portions of the patient's history were reviewed and updated as appropriate: allergies, current medications,  past family history, past medical history, past social history, past surgical history and problem list.   Review of Systems:  Pertinent items noted in HPI and remainder of comprehensive ROS otherwise negative.  Physical Exam:   General:  Alert, oriented and cooperative.   Mental Status: Normal mood and affect perceived. Normal judgment and thought content.  Physical exam deferred due to nature of the encounter  Labs and Imaging No results found for  this or any previous visit (from the past 336 hour(s)). No results found.    Assessment and Plan:     1. Muscle spasm   2. Migraine without aura and without status migrainosus, not intractable       Migraine significantly improved since initiation of nurtec!   I discussed the assessment and treatment plan with the patient. The patient was provided an opportunity to ask questions and all were answered. The patient agreed with the plan and demonstrated an understanding of the instructions.   The patient was advised to call back or seek an in-person evaluation/go to the ED if the symptoms worsen or if the condition fails to improve as anticipated.  I provided 32 minutes of virtual face-to-face time during this encounter.   Paticia Stack, PA-C Center for [facility], [place]

## 2021-12-16 ENCOUNTER — Encounter (HOSPITAL_BASED_OUTPATIENT_CLINIC_OR_DEPARTMENT_OTHER): Payer: Self-pay

## 2021-12-16 ENCOUNTER — Ambulatory Visit (HOSPITAL_BASED_OUTPATIENT_CLINIC_OR_DEPARTMENT_OTHER)
Admission: RE | Admit: 2021-12-16 | Discharge: 2021-12-16 | Disposition: A | Discharge status: Home / Self Care | Payer: BC Managed Care – PPO | Source: Ambulatory Visit | Attending: Internal Medicine | Admitting: Internal Medicine

## 2021-12-16 ENCOUNTER — Other Ambulatory Visit: Payer: Self-pay

## 2021-12-16 DIAGNOSIS — E161 Other hypoglycemia: Secondary | ICD-10-CM

## 2021-12-16 DIAGNOSIS — K5900 Constipation, unspecified: Secondary | ICD-10-CM | POA: Diagnosis not present

## 2021-12-16 MED ORDER — IOHEXOL 300 MG/ML  SOLN
80.0000 mL | Freq: Once | INTRAMUSCULAR | Status: AC | PRN
  Administered 2021-12-16: 16:00:00 80 mL via INTRAVENOUS

## 2021-12-19 ENCOUNTER — Ambulatory Visit (HOSPITAL_BASED_OUTPATIENT_CLINIC_OR_DEPARTMENT_OTHER): Admission: RE | Admit: 2021-12-19 | Payer: BC Managed Care – PPO | Source: Ambulatory Visit

## 2022-01-02 ENCOUNTER — Other Ambulatory Visit: Payer: BC Managed Care – PPO

## 2022-01-07 DIAGNOSIS — E039 Hypothyroidism, unspecified: Secondary | ICD-10-CM | POA: Diagnosis not present

## 2022-01-12 ENCOUNTER — Other Ambulatory Visit: Payer: Self-pay | Admitting: Internal Medicine

## 2022-01-28 DIAGNOSIS — E039 Hypothyroidism, unspecified: Secondary | ICD-10-CM | POA: Diagnosis not present

## 2022-01-28 DIAGNOSIS — R61 Generalized hyperhidrosis: Secondary | ICD-10-CM | POA: Diagnosis not present

## 2022-01-28 DIAGNOSIS — E161 Other hypoglycemia: Secondary | ICD-10-CM | POA: Diagnosis not present

## 2022-02-12 ENCOUNTER — Telehealth: Payer: Self-pay | Admitting: Internal Medicine

## 2022-02-12 NOTE — Telephone Encounter (Signed)
Inbound call from patient checking to see if Dr. Carlean Purl not the information from Dr. Buddy Duty about him wanting her to have a Endo done. Please advise.

## 2022-02-13 NOTE — Telephone Encounter (Signed)
Left Message for pt to call Back

## 2022-02-13 NOTE — Telephone Encounter (Signed)
Please see note below and Advise:  I do think the note was supposed to read Got the Information from Dr. Buddy Duty:

## 2022-02-13 NOTE — Telephone Encounter (Signed)
I have not seen notes but I see that her stomach looked abnormal on a CT scan and suspect that is the indication for EGD   So we can set up EGD direct for abnormal stomach on CT scan if she agrees

## 2022-02-14 NOTE — Telephone Encounter (Signed)
Pt made aware of Dr. Recommendations. Pt questioned if Dr. Nanetta Batty could do an EGD with Ultrasound: Pt was set up for an Office Visit to discuss EGD options for the Pt and treatment plan. Pt was scheduled for 2/21/20123 at 10:10: Pt made aware :  Pt verbalized understanding with all questions answered.

## 2022-02-14 NOTE — Telephone Encounter (Signed)
Left message for pt to call back  °

## 2022-02-14 NOTE — Telephone Encounter (Signed)
Hi Dr. Carlean Purl, we received a referral from Dr. Buddy Duty foe EUS. Pt is already scheduled for ov with you on 2/21 at 10:10 am. I will send you records over. Thank you.

## 2022-02-14 NOTE — Telephone Encounter (Signed)
Patient returning nurses call

## 2022-02-18 ENCOUNTER — Other Ambulatory Visit (INDEPENDENT_AMBULATORY_CARE_PROVIDER_SITE_OTHER): Payer: BC Managed Care – PPO

## 2022-02-18 ENCOUNTER — Telehealth: Payer: Self-pay

## 2022-02-18 ENCOUNTER — Ambulatory Visit: Payer: BC Managed Care – PPO | Admitting: Internal Medicine

## 2022-02-18 ENCOUNTER — Encounter: Payer: Self-pay | Admitting: Internal Medicine

## 2022-02-18 VITALS — BP 122/78 | HR 76 | Ht 62.5 in | Wt 128.0 lb

## 2022-02-18 DIAGNOSIS — Z7960 Long term (current) use of unspecified immunomodulators and immunosuppressants: Secondary | ICD-10-CM | POA: Diagnosis not present

## 2022-02-18 DIAGNOSIS — E161 Other hypoglycemia: Secondary | ICD-10-CM

## 2022-02-18 DIAGNOSIS — K51519 Left sided colitis with unspecified complications: Secondary | ICD-10-CM

## 2022-02-18 DIAGNOSIS — R933 Abnormal findings on diagnostic imaging of other parts of digestive tract: Secondary | ICD-10-CM

## 2022-02-18 DIAGNOSIS — Z111 Encounter for screening for respiratory tuberculosis: Secondary | ICD-10-CM | POA: Diagnosis not present

## 2022-02-18 LAB — CBC WITH DIFFERENTIAL/PLATELET
Basophils Absolute: 0.1 10*3/uL (ref 0.0–0.1)
Basophils Relative: 1 % (ref 0.0–3.0)
Eosinophils Absolute: 0.2 10*3/uL (ref 0.0–0.7)
Eosinophils Relative: 2.8 % (ref 0.0–5.0)
HCT: 37.3 % (ref 36.0–46.0)
Hemoglobin: 12.5 g/dL (ref 12.0–15.0)
Lymphocytes Relative: 51.4 % — ABNORMAL HIGH (ref 12.0–46.0)
Lymphs Abs: 3.1 10*3/uL (ref 0.7–4.0)
MCHC: 33.4 g/dL (ref 30.0–36.0)
MCV: 89.8 fl (ref 78.0–100.0)
Monocytes Absolute: 0.6 10*3/uL (ref 0.1–1.0)
Monocytes Relative: 10.3 % (ref 3.0–12.0)
Neutro Abs: 2.1 10*3/uL (ref 1.4–7.7)
Neutrophils Relative %: 34.5 % — ABNORMAL LOW (ref 43.0–77.0)
Platelets: 197 10*3/uL (ref 150.0–400.0)
RBC: 4.16 Mil/uL (ref 3.87–5.11)
RDW: 13.5 % (ref 11.5–15.5)
WBC: 6.1 10*3/uL (ref 4.0–10.5)

## 2022-02-18 NOTE — Patient Instructions (Addendum)
Dr. Carlean Purl will be in contact with you regarding scheduling EUS in the next few days after he discusses case with his fellow physicians. If you have not heard from the office in the next few days you may call and ask for Dr Celesta Aver nurse -Remo Lipps RN   Your provider has requested that you go to the basement level for lab work before leaving today. Press "B" on the elevator. The lab is located at the first door on the left as you exit the elevator.  If you are age 50 or older, your body mass index should be between 23-30. Your Body mass index is 23.04 kg/m. If this is out of the aforementioned range listed, please consider follow up with your Primary Care Provider.  If you are age 30 or younger, your body mass index should be between 19-25. Your Body mass index is 23.04 kg/m. If this is out of the aformentioned range listed, please consider follow up with your Primary Care Provider.   ________________________________________________________  The [facility] providers would like to encourage you to use [facility] to communicate with providers for non-urgent requests or questions.  Due to long hold times on the telephone, sending your provider a message by [facility] may be a faster and more efficient way to get a response.  Please allow 48 business hours for a response.  Please remember that this is for non-urgent requests.  _______________________________________________________  Thank you for choosing me and [facility].  Dr. Carlean Purl

## 2022-02-18 NOTE — Telephone Encounter (Signed)
-----   Message from Milus Banister, MD sent at 02/18/2022  2:50 PM EST ----- Regarding: RE: EUS request please Sounds reasonable.  I think [facility]Spot is another option but may be hard to get it PA'd unless tumor is proven already.  Feliciano Wynter, She needs first available EUS with myself or Gabe for ?insulinoma, abnormal stomach  thanks ----- Message ----- From: Gatha Mayer, MD Sent: 02/18/2022   2:33 PM EST To: Milus Banister, MD, # Subject: EUS request please                             Gentlemen:  This very nice lady is a patient of mine with IBD Crohn's versus UC.  She is doing well regarding that.  She has had some hypoglycemia issues and is seeing Dagmar Hait of [facility] endocrinology.  Based upon testing there is concern for an insulinoma.  CT abdomen and pelvis was not revealing though the antrum was a little thickened possibly.  Merry Proud is asking for an EUS of the pancreas to look for possible insulinoma.  Seems reasonable to me.  I appreciate your evaluation and recommendations.  Thanks as always.  Glendell Docker

## 2022-02-18 NOTE — Progress Notes (Signed)
Shelly Wilkinson 51 y.o. [birth year] [id number]  Assessment & Plan:   Encounter Diagnoses  Name Primary?   Hyperinsulinemic hypoglycemia Yes   Left sided ulcerative colitis (? Crohn's) with complication (HCC)    Long-term use of immunosuppressant medication - Humira    Abnormal CT scan, stomach     Her IBD is doing well and we will continue the Humira.  She has not had any further ocular issues.  She needs a QuantiFERON and a CBC today. Plan for routine colonoscopy in 2025 and follow-up with me in a year sooner as needed.   Regarding the hypoglycemia issues it seems reasonable to me for her to have an EUS and I explained this to her and that my partners do that.  I will message them and request that they schedule the procedure if they agree.  I would bet that the abnormal antrum on CT scan is a nonissue but that we will be able to be evaluated at EUS as well.  I appreciate the opportunity to care for this patient. CC: Kelton Pillar, MD Delrae Rend, MD   Subjective:   Chief Complaint: Hypoglycemia question insulinoma, schedule EUS  HPI 51 year old white woman with left-sided colitis issue Crohn's versus UC and complicated hemorrhoids in the past who has been doing well on every other week Humira for a number of years now and continues to do well with respect to her inflammatory bowel disease.  She was last seen about a year ago and a fecal calprotectin was normal.  She would be due for a routine colonoscopy in 2025.  She needs another QuantiFERON test to stay in compliance and labs.  She did come off Humira temporarily during a trip [place] and was contemplating not restarting but developed a uveitis epi scleritis I suppose.  I have reviewed the note but that is described but no formal diagnosis indicated in the assessment and plan though her chronic ocular issues were listed.  Her ophthalmologist diagnosed that and treated it and recommend she get back on Humira and she did  so last year.  She had been having problems with hypoglycemia and saw Dr. Buddy Duty of [facility] endocrinology.  She has hyperinsulinemic hypoglycemia and had a CT scan to evaluate for possible insulinoma.  That study was negative.  There was possible circumferential thickening of the antrum.  She had an abnormal mixed meal test and given the negative CT Dr. Buddy Duty is asking for an EUS of the pancreas to rule out insulinoma.  Social history she continues to work as a Pharmacist, hospital, her oldest son graduated from [employer] in December with an Engineer, production degree Allergies  Allergen Reactions   Sulfa Antibiotics Hives, Nausea And Vomiting and Other (See Comments)    And fever   Current Meds  Medication Sig   Adalimumab (HUMIRA) 40 MG/0.8ML PSKT Inject 40 mg subcutaneous every 14 days   cholecalciferol (VITAMIN D) 1000 units tablet Take 1,000 Units by mouth daily.   cyclobenzaprine (FLEXERIL) 10 MG tablet Take 1 tablet (10 mg total) by mouth every 8 (eight) hours as needed for muscle spasms.   cycloSPORINE (RESTASIS) 0.05 % ophthalmic emulsion 1 drop 2 (two) times daily.   DULoxetine (CYMBALTA) 60 MG capsule TAKE 1 CAPSULE(60 MG) BY MOUTH DAILY   ibuprofen (ADVIL,MOTRIN) 200 MG tablet Take 200 mg by mouth every 6 (six) hours as needed. Patient takes as needed   levothyroxine (SYNTHROID) 75 MCG tablet Take 75 mcg by mouth daily before breakfast.   Rimegepant Sulfate (  NURTEC) 75 MG TBDP Take 75 mg by mouth every other day.   tazarotene (TAZORAC) 0.05 % cream Apply topically at bedtime.   Past Medical History:  Diagnosis Date   Allergy    Anemia, iron deficiency 08/25/2016   Anxiety    Arthritis    Choroidal nevus    Chronic anal fissure    Complication of anesthesia    headaches/   06-22-2014 surg. 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   Macular degeneration    Migraines    Thrombosed hemorrhoids    Vitamin D deficiency 11/19/2016   Wears glasses    Past Surgical History:  Procedure Laterality Date   ANAL RECTAL MANOMETRY N/A 09/17/2016   Procedure: ANO RECTAL MANOMETRY;  Surgeon: Gatha Mayer, MD;  Location: [facility];  Service: Endoscopy;  Laterality: N/A;   CHEILECTOMY Right 12/15/2013   Procedure: RIGHT HALLUX CHEILECTOMY;  Surgeon: Wylene Simmer, MD;  Location: [facility];  Service: Orthopedics;  Laterality: Right;   CHEILECTOMY Left 02-12-2006   great toe   COLONOSCOPY  10-02-2010   CYSTO/  HYDRODISTENTION/  INSTILLATION THERAPY  03-19-2010   D & C HYSTEROSCOPY /  HYDROTHERMAL ENDOMETRIAL ABLATION/  LEEP  05-29-2011   DILATION AND EVACUATION  2002   EVALUATION UNDER ANESTHESIA WITH ANAL FISTULECTOMY N/A 12/26/2014   Procedure: EXAM UNDER ANESTHESIA ;  Surgeon: Leighton Ruff, MD;  Location: [facility];  Service: General;  Laterality: N/A;   HEMORRHOID BANDING  02-16-2014   REPAIR LACERATED RADIAL DIGITAL NERVE LEFT RING FINGER  08-25-2005   SPHINCTEROTOMY N/A 12/26/2014   Procedure: CHEMICAL SPHINCTEROTOMY (BOTOX);  Surgeon: Leighton Ruff, MD;  Location: [facility];  Service: General;  Laterality: N/A;   TRANSANAL HEMORRHOIDAL DEARTERIALIZATION  06-22-2014   Social History   Social History Narrative   Married, 2 children - sons 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   Review of Systems As above  Objective:   Physical Exam @BP  122/78    Pulse 76    Ht 5' 2.5" (1.588 m)    Wt 128 lb (58.1 kg)    SpO2 97%    BMI 23.04 kg/m @  General:  NAD Eyes:   anicteric Lungs:  clear Heart::  S1S2 no rubs, murmurs or gallops Abdomen:  soft and nontender, BS+ Ext:   no edema, cyanosis or clubbing    Data Reviewed:  See above I have reviewed [facility]ology notes and labs from the last several months and the CT scan as well

## 2022-02-21 ENCOUNTER — Other Ambulatory Visit: Payer: Self-pay

## 2022-02-21 DIAGNOSIS — R933 Abnormal findings on diagnostic imaging of other parts of digestive tract: Secondary | ICD-10-CM

## 2022-02-21 LAB — QUANTIFERON-TB GOLD PLUS
Mitogen-NIL: >10 IU/mL
NIL: 0.04 IU/mL
QuantiFERON-TB Gold Plus: NEGATIVE
TB1-NIL: 0.01 IU/mL
TB2-NIL: 0.01 IU/mL

## 2022-02-21 NOTE — Telephone Encounter (Signed)
EUS scheduled, pt instructed and medications reviewed.  Patient instructions mailed to home and sent to My Chart .  Patient to call with any questions or concerns.  

## 2022-02-21 NOTE — Telephone Encounter (Signed)
EUS has been scheduled for 04/17/22 with DJ at [facility] at 845 am Left message on machine to call back

## 2022-04-09 ENCOUNTER — Encounter: Payer: Self-pay | Admitting: Internal Medicine

## 2022-04-10 ENCOUNTER — Encounter (HOSPITAL_COMMUNITY): Payer: Self-pay | Admitting: Gastroenterology

## 2022-04-16 NOTE — Anesthesia Preprocedure Evaluation (Addendum)
Anesthesia Evaluation  ?Patient identified by MRN, date of birth, ID band ?Patient awake ? ? ? ?Reviewed: ?Allergy & Precautions, NPO status , Patient's Chart, lab work & pertinent test results ? ?History of Anesthesia Complications ?Negative for: history of anesthetic complications ? ?Airway ?Mallampati: I ? ?TM Distance: >3 FB ?Neck ROM: Full ? ? ? Dental ?no notable dental hx. ? ?  ?Pulmonary ?neg pulmonary ROS,  ?  ?Pulmonary exam normal ? ? ? ? ? ? ? Cardiovascular ?negative cardio ROS ?Normal cardiovascular exam ? ? ?  ?Neuro/Psych ? Headaches, Anxiety   ? GI/Hepatic ?Neg liver ROS, PUD, GERD  ,  ?Endo/Other  ?Hypothyroidism  ? Renal/GU ?negative Renal ROS  ?negative genitourinary ?  ?Musculoskeletal ? ?(+) Arthritis ,  ? Abdominal ?  ?Peds ? Hematology ?negative hematology ROS ?(+)   ?Anesthesia Other Findings ?Day of surgery medications reviewed with patient. ? Reproductive/Obstetrics ?negative OB ROS ? ?  ? ? ? ? ? ? ? ? ? ? ? ? ? ?  ?  ? ? ? ? ? ? ? ?Anesthesia Physical ?Anesthesia Plan ? ?ASA: 2 ? ?Anesthesia Plan: MAC  ? ?Post-op Pain Management: Minimal or no pain anticipated  ? ?Induction:  ? ?PONV Risk Score and Plan: Treatment may vary due to age or medical condition and Propofol infusion ? ?Airway Management Planned: Natural Airway and Nasal Cannula ? ?Additional Equipment: None ? ?Intra-op Plan:  ? ?Post-operative Plan:  ? ?Informed Consent: I have reviewed the patients History and Physical, chart, labs and discussed the procedure including the risks, benefits and alternatives for the proposed anesthesia with the patient or authorized representative who has indicated his/her understanding and acceptance.  ? ? ? ? ? ?Plan Discussed with: CRNA ? ?Anesthesia Plan Comments:   ? ? ? ? ? ?Anesthesia Quick Evaluation ? ?

## 2022-04-17 ENCOUNTER — Encounter (HOSPITAL_COMMUNITY): Payer: Self-pay | Admitting: Gastroenterology

## 2022-04-17 ENCOUNTER — Encounter (HOSPITAL_COMMUNITY)
Admission: RE | Disposition: A | Discharge status: Home / Self Care | Payer: Self-pay | Source: Home / Self Care | Attending: Gastroenterology

## 2022-04-17 ENCOUNTER — Ambulatory Visit (HOSPITAL_COMMUNITY): Payer: BC Managed Care – PPO | Admitting: Anesthesiology

## 2022-04-17 ENCOUNTER — Ambulatory Visit (HOSPITAL_COMMUNITY)
Admission: RE | Admit: 2022-04-17 | Discharge: 2022-04-17 | Disposition: A | Discharge status: Home / Self Care | Payer: BC Managed Care – PPO | Attending: Gastroenterology | Admitting: Gastroenterology

## 2022-04-17 ENCOUNTER — Other Ambulatory Visit: Payer: Self-pay

## 2022-04-17 DIAGNOSIS — E039 Hypothyroidism, unspecified: Secondary | ICD-10-CM | POA: Insufficient documentation

## 2022-04-17 DIAGNOSIS — R799 Abnormal finding of blood chemistry, unspecified: Secondary | ICD-10-CM | POA: Diagnosis not present

## 2022-04-17 DIAGNOSIS — K219 Gastro-esophageal reflux disease without esophagitis: Secondary | ICD-10-CM | POA: Diagnosis not present

## 2022-04-17 DIAGNOSIS — R933 Abnormal findings on diagnostic imaging of other parts of digestive tract: Secondary | ICD-10-CM

## 2022-04-17 DIAGNOSIS — E162 Hypoglycemia, unspecified: Secondary | ICD-10-CM | POA: Diagnosis not present

## 2022-04-17 DIAGNOSIS — M1990 Unspecified osteoarthritis, unspecified site: Secondary | ICD-10-CM | POA: Diagnosis not present

## 2022-04-17 DIAGNOSIS — F419 Anxiety disorder, unspecified: Secondary | ICD-10-CM | POA: Diagnosis not present

## 2022-04-17 DIAGNOSIS — R935 Abnormal findings on diagnostic imaging of other abdominal regions, including retroperitoneum: Secondary | ICD-10-CM | POA: Diagnosis not present

## 2022-04-17 HISTORY — PX: EUS: SHX5427

## 2022-04-17 HISTORY — PX: ESOPHAGOGASTRODUODENOSCOPY (EGD) WITH PROPOFOL: SHX5813

## 2022-04-17 SURGERY — UPPER ENDOSCOPIC ULTRASOUND (EUS) RADIAL
Anesthesia: Monitor Anesthesia Care

## 2022-04-17 MED ORDER — PROPOFOL 10 MG/ML IV BOLUS
INTRAVENOUS | Status: DC | PRN
  Administered 2022-04-17 (×2): 20 mg via INTRAVENOUS
  Administered 2022-04-17: 40 mg via INTRAVENOUS
  Administered 2022-04-17: 20 mg via INTRAVENOUS

## 2022-04-17 MED ORDER — LIDOCAINE 2% (20 MG/ML) 5 ML SYRINGE
INTRAMUSCULAR | Status: DC | PRN
  Administered 2022-04-17: 40 mg via INTRAVENOUS
  Administered 2022-04-17: 20 mg via INTRAVENOUS
  Administered 2022-04-17: 40 mg via INTRAVENOUS

## 2022-04-17 MED ORDER — SODIUM CHLORIDE 0.9 % IV SOLN: INTRAVENOUS | Status: DC

## 2022-04-17 MED ORDER — LACTATED RINGERS IV SOLN: INTRAVENOUS | Status: DC | PRN

## 2022-04-17 MED ORDER — LACTATED RINGERS IV SOLN: INTRAVENOUS | Status: DC

## 2022-04-17 MED ORDER — PROPOFOL 500 MG/50ML IV EMUL
INTRAVENOUS | Status: DC | PRN
  Administered 2022-04-17: 125 ug/kg/min via INTRAVENOUS

## 2022-04-17 NOTE — Anesthesia Procedure Notes (Signed)
Procedure Name: [place] ?Date/Time: 04/17/2022 8:41 AM ?Performed by: Cynda Familia, CRNA ?Pre-anesthesia Checklist: Patient identified, Suction available, Patient being monitored, Timeout performed and Emergency Drugs available ?Patient Re-evaluated:Patient Re-evaluated prior to induction ?Placement Confirmation: positive ETCO2 and breath sounds checked- equal and bilateral ?Dental Injury: Teeth and Oropharynx as per pre-operative assessment  ? ? ? ? ?

## 2022-04-17 NOTE — Op Note (Signed)
[facility] ?Patient Name: Shelly Wilkinson ?Procedure Date: 04/17/2022 ?MRN: [id number] ?Attending MD: Milus Banister , MD ?Date of Birth: [birth year] ?CSN: [id number] ?Age: 51 ?Admit Type: Outpatient ?Procedure:                Upper EUS ?Indications:              hypoglycemia. Endocrinology suspects she may have  ?                          insulinoma. CT of pancreas normal ?Providers:                Milus Banister, MD, Debi Mays, RN, Janie Billups,  ?                          Technician, Glenis Smoker, CRNA ?Referring MD:             Silvano Rusk, MD ?Medicines:                Monitored Anesthesia Care ?Complications:            No immediate complications. Estimated blood loss:  ?                          None. ?Estimated Blood Loss:     Estimated blood loss: none. ?Procedure:                Pre-Anesthesia Assessment: ?                          - Prior to the procedure, a History and Physical  ?                          was performed, and patient medications and  ?                          allergies were reviewed. The patient's tolerance of  ?                          previous anesthesia was also reviewed. The risks  ?                          and benefits of the procedure and the sedation  ?                          options and risks were discussed with the patient.  ?                          All questions were answered, and informed consent  ?                          was obtained. Prior Anticoagulants: The patient has  ?                          taken no previous anticoagulant or antiplatelet  ?  agents. ASA Grade Assessment: II - A patient with  ?                          mild systemic disease. After reviewing the risks  ?                          and benefits, the patient was deemed in  ?                          satisfactory condition to undergo the procedure. ?                          After obtaining informed consent, the endoscope was  ?                           passed under direct vision. Throughout the  ?                          procedure, the patient's blood pressure, pulse, and  ?                          oxygen saturations were monitored continuously. The  ?                          [id number]-[id number] ([id number]) Olympus radial ultrasound  ?                          scope was introduced through the mouth, and  ?                          advanced to the second part of duodenum. The upper  ?                          EUS was accomplished without difficulty. The  ?                          patient tolerated the procedure well. ?Scope In: ?Scope Out: ?Findings: ?     ENDOSCOPIC FINDING: : ?     The examined esophagus was endoscopically normal. ?     The entire examined stomach was endoscopically normal. ?     The examined duodenum was endoscopically normal. ?     ENDOSONOGRAPHIC FINDING: : ?     There was no sign of significant endosonographic abnormality in the  ?     entire pancreas. ?     Normal main pancreatic duct. ?     CBD was normal, non-dilated and no stones. ?     Gastric wall was normal throughout. ?     Limited views of the liver, spleen, portal and splenic vessels were all  ?     normal. ?Impression:               - Normal upper EUS examination. No signs of mass  ?                          lesions to explain her hypoglycemia. ?  Moderate Sedation: ?     Not Applicable - Patient had care per Anesthesia. ?Recommendation:           - Discharge patient to home. ?Procedure Code(s):        --- Professional --- ?                          [phone], Esophagogastroduodenoscopy, flexible,  ?                          transoral; with endoscopic ultrasound examination  ?                          limited to the esophagus, stomach or duodenum, and  ?                          adjacent structures ?Diagnosis Code(s):        --- Professional --- ?                          R79.9, Abnormal finding of blood chemistry,  ?                          unspecified ?CPT copyright 2019 American Medical  Association. All rights reserved. ?The codes documented in this report are preliminary and upon coder review may  ?be revised to meet current compliance requirements. ?Milus Banister, MD ?04/17/2022 9:19:06 AM ?This report has been signed electronically. ?Number of Addenda: 0 ?

## 2022-04-17 NOTE — H&P (Signed)
?HPI: ?This is a woman with hypoglycemia. Her endocrinologist suspects she might have an insulinoma. CT of pancreas was normal.  Distal stomach looked thickened however. ? ?ROS: complete GI ROS as described in HPI, all other review negative. ? ?Constitutional:  No unintentional weight loss ? ? ?Past Medical History:  ?Diagnosis Date  ? Allergy   ? Anemia, iron deficiency 08/25/2016  ? Anxiety   ? Arthritis   ? Choroidal nevus   ? Chronic anal fissure   ? Complication of anesthesia   ? headaches/   06-22-2014 surg. had urinary retention  ? Constipation due to outlet dysfunction   ? Dyssynergic constipation 10/01/2016  ? Anorectal mano  ? Episcleritis-uveitis? 08/2021  ? GERD (gastroesophageal reflux disease)   ? Hyperinsulinemic hypoglycemia   ? Hyperlipidemia   ? Hypoglycemia 12/10/2021  ? Hypothyroidism   ? IBS (irritable bowel syndrome)   ? IC (interstitial cystitis)   ? 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 Macular degeneration   ? Migraines   ? Thrombosed hemorrhoids   ? Vitamin D deficiency 11/19/2016  ? Wears glasses   ? ? ?Past Surgical History:  ?Procedure Laterality Date  ? ANAL RECTAL MANOMETRY N/A 09/17/2016  ? Procedure: ANO RECTAL MANOMETRY;  Surgeon: Gatha Mayer, MD;  Location: [facility];  Service: Endoscopy;  Laterality: N/A;  ? CHEILECTOMY Right 12/15/2013  ? Procedure: RIGHT HALLUX CHEILECTOMY;  Surgeon: Wylene Simmer, MD;  Location: [place];  Service: Orthopedics;  Laterality: Right;  ? CHEILECTOMY Left 02-12-2006  ? great toe  ? COLONOSCOPY  10-02-2010  ? CYSTO/  HYDRODISTENTION/  INSTILLATION THERAPY  03-19-2010  ? D & C HYSTEROSCOPY /  HYDROTHERMAL ENDOMETRIAL ABLATION/  LEEP  05-29-2011  ? DILATION AND EVACUATION  2002  ? EVALUATION UNDER ANESTHESIA WITH ANAL FISTULECTOMY N/A 12/26/2014  ? Procedure: EXAM UNDER ANESTHESIA ;  Surgeon: Leighton Ruff, MD;  Location: [facility];  Service: General;  Laterality: N/A;  ? HEMORRHOID BANDING  02-16-2014  ? REPAIR  LACERATED RADIAL DIGITAL NERVE LEFT RING FINGER  08-25-2005  ? SPHINCTEROTOMY N/A 12/26/2014  ? Procedure: CHEMICAL SPHINCTEROTOMY (BOTOX);  Surgeon: Leighton Ruff, MD;  Location: [facility];  Service: General;  Laterality: N/A;  ? TRANSANAL HEMORRHOIDAL DEARTERIALIZATION  06-22-2014  ? ? ?Current Outpatient Medications  ?Medication Instructions  ? Adalimumab (HUMIRA) 40 MG/0.8ML PSKT Inject 40 mg subcutaneous every 14 days  ? ALOE VERA PO 1-2 capsules, Oral, See admin instructions, Take 1 capsule in the AM and 2 capsules in the PM.  ? cholecalciferol (VITAMIN D) 1,000 Units, Oral, Daily  ? cyclobenzaprine (FLEXERIL) 10 mg, Oral, Every 8 hours PRN  ? cycloSPORINE (RESTASIS) 0.05 % ophthalmic emulsion 1 drop, Both Eyes, 2 times daily  ? DULoxetine (CYMBALTA) 60 MG capsule TAKE 1 CAPSULE(60 MG) BY MOUTH DAILY  ? ibuprofen (ADVIL) 600 mg, Oral, Every 6 hours PRN, Patient takes as needed  ? levothyroxine (SYNTHROID) 75 mcg, Oral, Daily before breakfast  ? Multiple Vitamins-Minerals (PRESERVISION AREDS PO) 1 tablet, Oral, 2 times daily  ? Nurtec 75 mg, Oral, Every other day  ? tazarotene (TAZORAC) 0.94 % cream 1 application., Topical, Daily at bedtime  ? ? ?Allergies as of 02/21/2022 - Review Complete 02/18/2022  ?Allergen Reaction Noted  ? Sulfa antibiotics Hives, Nausea And Vomiting, and Other (See Comments) 12/15/2014  ? ? ?Family History  ?Problem Relation Age of Onset  ? Diabetes Father   ? Heart disease Father   ? Heart attack Father   ?  Hypertension Father   ? Colitis Father   ? Stroke Father   ? Rheum arthritis Maternal Grandmother   ? Heart attack Paternal Grandmother   ? Lung cancer Paternal Grandfather   ? Colon cancer Maternal Aunt 88  ? Stomach cancer Neg Hx   ? Esophageal cancer Neg Hx   ? Pancreatic cancer Neg Hx   ? ? ?Social History  ? ?Socioeconomic History  ? Marital status: Married  ?  Spouse name: Not on file  ? Number of children: 2  ? Years of education: Not on file  ? Highest  education level: Not on file  ?Occupational History  ? Occupation: TEACHER   ?  Employer: [employer]  ?Tobacco Use  ? Smoking status: Never  ? Smokeless tobacco: Never  ?Vaping Use  ? Vaping Use: Never used  ?Substance and Sexual Activity  ? Alcohol use: Yes  ?  Comment: rarely  ? Drug use: No  ? Sexual activity: Yes  ?  Partners: Male  ?  Birth control/protection: Surgical  ?  Comment: ablation  ?Other Topics Concern  ? Not on file  ?Social History Narrative  ? Married, 2 children - sons one [place]-graduated one [employer] [place]   ? Fourth  grade teacher  ? No drugs some alcohol never smoker  ?   ? ?Social Determinants of Health  ? ?Financial Resource Strain: Not on file  ?Food Insecurity: Not on file  ?Transportation Needs: Not on file  ?Physical Activity: Not on file  ?Stress: Not on file  ?Social Connections: Not on file  ?Intimate Partner Violence: Not on file  ? ? ? ?Physical Exam: ? ?Constitutional: generally well-appearing ?Psychiatric: alert and oriented x3 ?Abdomen: soft, nontender, nondistended, no obvious ascites, no peritoneal signs, normal bowel sounds ?No peripheral edema noted in lower extremities ? ?Assessment and plan: ?51 y.o. female with  hypoglycemia. Her endocrinologist suspects she might have an insulinoma. CT of pancreas was normal.  Distal stomach looked thickened however. ? ?EUS evaluation today ? ?Please see the "Patient Instructions" section for addition details about the plan. ? ?Owens Loffler, MD ?[facility] ?04/17/2022, 7:31 AM ? ? ?

## 2022-04-17 NOTE — Anesthesia Postprocedure Evaluation (Signed)
Anesthesia Post Note ? ?Patient: Shelly Wilkinson ? ?Procedure(s) Performed: UPPER ENDOSCOPIC ULTRASOUND (EUS) RADIAL ?ESOPHAGOGASTRODUODENOSCOPY (EGD) WITH PROPOFOL ? ?  ? ?Patient location during evaluation: PACU ?Anesthesia Type: MAC ?Level of consciousness: awake and alert ?Pain management: pain level controlled ?Vital Signs Assessment: post-procedure vital signs reviewed and stable ?Respiratory status: spontaneous breathing, nonlabored ventilation and respiratory function stable ?Cardiovascular status: blood pressure returned to baseline ?Postop Assessment: no apparent nausea or vomiting ?Anesthetic complications: no ? ? ?No notable events documented. ? ?Last Vitals:  ?Vitals:  ? 04/17/22 0929 04/17/22 0933  ?BP: (!) 98/59 95/63  ?Pulse: 65 66  ?Resp: 12 10  ?Temp:    ?SpO2: 98% 99%  ?  ?Last Pain:  ?Vitals:  ? 04/17/22 0909  ?TempSrc: Temporal  ?PainSc: 0-No pain  ? ? ?  ?  ?  ?  ?  ?  ? ?Marthenia Rolling ? ? ? ? ?

## 2022-04-17 NOTE — Transfer of Care (Signed)
Immediate Anesthesia Transfer of Care Note ? ?Patient: Hadlei Stitt Aguino ? ?Procedure(s) Performed: UPPER ENDOSCOPIC ULTRASOUND (EUS) RADIAL ?ESOPHAGOGASTRODUODENOSCOPY (EGD) WITH PROPOFOL ? ?Patient Location: PACU ? ?Anesthesia Type:General ? ?Level of Consciousness: drowsy ? ?Airway & Oxygen Therapy: Patient Spontanous Breathing and Patient connected to face mask oxygen ? ?Post-op Assessment: Report given to RN and Post -op Vital signs reviewed and stable ? ?Post vital signs: Reviewed and stable ? ?Last Vitals:  ?Vitals Value Taken Time  ?BP 103/66 04/17/22 0909  ?Temp 36.8 ?C 04/17/22 0909  ?Pulse 74 04/17/22 0909  ?Resp 13 04/17/22 0909  ?SpO2 100 % 04/17/22 0909  ? ? ?Last Pain:  ?Vitals:  ? 04/17/22 0909  ?TempSrc: Temporal  ?   ? ?  ? ?Complications: No notable events documented. ?

## 2022-05-01 DIAGNOSIS — L720 Epidermal cyst: Secondary | ICD-10-CM | POA: Diagnosis not present

## 2022-05-01 DIAGNOSIS — D225 Melanocytic nevi of trunk: Secondary | ICD-10-CM | POA: Diagnosis not present

## 2022-05-01 DIAGNOSIS — L578 Other skin changes due to chronic exposure to nonionizing radiation: Secondary | ICD-10-CM | POA: Diagnosis not present

## 2022-05-20 DIAGNOSIS — J029 Acute pharyngitis, unspecified: Secondary | ICD-10-CM | POA: Diagnosis not present

## 2022-05-23 DIAGNOSIS — H66003 Acute suppurative otitis media without spontaneous rupture of ear drum, bilateral: Secondary | ICD-10-CM | POA: Diagnosis not present

## 2022-05-23 DIAGNOSIS — J038 Acute tonsillitis due to other specified organisms: Secondary | ICD-10-CM | POA: Diagnosis not present

## 2022-07-02 DIAGNOSIS — E039 Hypothyroidism, unspecified: Secondary | ICD-10-CM | POA: Diagnosis not present

## 2022-07-02 DIAGNOSIS — E559 Vitamin D deficiency, unspecified: Secondary | ICD-10-CM | POA: Diagnosis not present

## 2022-07-04 ENCOUNTER — Ambulatory Visit (INDEPENDENT_AMBULATORY_CARE_PROVIDER_SITE_OTHER): Payer: BC Managed Care – PPO | Admitting: Physician Assistant

## 2022-07-04 ENCOUNTER — Encounter: Payer: Self-pay | Admitting: Physician Assistant

## 2022-07-04 VITALS — BP 105/68 | HR 79 | Wt 129.0 lb

## 2022-07-04 DIAGNOSIS — M62838 Other muscle spasm: Secondary | ICD-10-CM | POA: Diagnosis not present

## 2022-07-04 DIAGNOSIS — G43009 Migraine without aura, not intractable, without status migrainosus: Secondary | ICD-10-CM | POA: Diagnosis not present

## 2022-07-04 NOTE — Progress Notes (Signed)
Pt here to F/U on headache management.   Pt states Nurtec helps a great deal with HA's .  However pt states her HA's will go through phases where she may have them often.  History:  Shelly Wilkinson is a 51 y.o. [id number] who presents to clinic today for headache followup.  She notes the nurtec has been very helpful overall but there are times that the headaches break through and are relentless.   They seem to be related to weather changes and hormonal changes.  She is having hot flashes and other symptoms of menopause.    HIT6:56 Number of days in the last 4 weeks with:  Severe headache: 0 Moderate headache: 0 Mild headache: 9  No headache: 19   Past Medical History:  Diagnosis Date   Allergy    Anemia, iron deficiency 08/25/2016   Anxiety    Arthritis    Choroidal nevus    Chronic anal fissure    Complication of anesthesia    headaches/   06-22-2014 surg. had urinary retention   Constipation due to outlet dysfunction    Dyssynergic constipation 10/01/2016   Anorectal mano   Episcleritis-uveitis? 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   Macular degeneration    Migraines    Thrombosed hemorrhoids    Vitamin D deficiency 11/19/2016   Wears glasses     Social History   Socioeconomic History   Marital status: Married    Spouse name: Not on file   Number of children: 2   Years of education: Not on file   Highest education level: Not on file  Occupational History   Occupation: Associate Professor: [employer]  Tobacco Use   Smoking status: Never   Smokeless tobacco: Never  Vaping Use   Vaping Use: Never used  Substance and Sexual Activity   Alcohol use: Yes    Comment: rarely   Drug use: No   Sexual activity: Yes    Partners: Male    Birth  control/protection: Surgical    Comment: ablation  Other Topics Concern   Not on file  Social History Narrative   Married, 2 children - sons one [place]-graduated one [place]    Fourth  grade teacher   No drugs some alcohol never smoker      Social Determinants of Radio broadcast assistant Strain: Not on file  Food Insecurity: Not on file  Transportation Needs: Not on file  Physical Activity: Not on file  Stress: Not on file  Social Connections: Not on file  Intimate Partner Violence: Not on file    Family History  Problem Relation Age of Onset   Diabetes Father    Heart disease Father    Heart attack Father    Hypertension Father    Colitis Father    Stroke Father    Rheum arthritis Maternal Grandmother    Heart attack Paternal Grandmother    Lung cancer Paternal Grandfather    Colon cancer Maternal Aunt 65   Stomach cancer Neg Hx    Esophageal cancer Neg Hx    Pancreatic cancer Neg Hx     Allergies  Allergen Reactions   Sulfa Antibiotics Hives, Nausea And Vomiting and Other (See Comments)    And  fever    Current Outpatient Medications on File Prior to Visit  Medication Sig Dispense Refill   Adalimumab (HUMIRA) 40 MG/0.8ML PSKT Inject 40 mg subcutaneous every 14 days 2 each 11   ALOE VERA PO Take 1-2 capsules by mouth See admin instructions. Take 1 capsule in the AM and 2 capsules in the PM.     [employer] SUNSCREEN SPF 30 EX apply topically to face and body daily for 30     cyclobenzaprine (FLEXERIL) 10 MG tablet Take 1 tablet (10 mg total) by mouth every 8 (eight) hours as needed for muscle spasms. (Patient taking differently: Take 2.5 mg by mouth every 8 (eight) hours as needed (migraine headache).) 40 tablet 1   cycloSPORINE (RESTASIS) 0.05 % ophthalmic emulsion Place 1 drop into both eyes 2 (two) times daily.     DULoxetine (CYMBALTA) 60 MG capsule TAKE 1 CAPSULE(60 MG) BY MOUTH DAILY 90 capsule 3   ibuprofen (ADVIL,MOTRIN) 200 MG tablet Take 600 mg by mouth every  6 (six) hours as needed for mild pain. Patient takes as needed     levothyroxine (SYNTHROID) 75 MCG tablet Take 75 mcg by mouth daily before breakfast.     Multiple Vitamins-Minerals (PRESERVISION AREDS PO) Take 1 tablet by mouth in the morning and at bedtime.     Rimegepant Sulfate (NURTEC) 75 MG TBDP Take 75 mg by mouth every other day. 16 tablet 11   tazarotene (TAZORAC) 0.05 % cream Apply 1 application. topically at bedtime.     cholecalciferol (VITAMIN D) 1000 units tablet Take 1,000 Units by mouth daily. (Patient not taking: Reported on 07/04/2022)     No current facility-administered medications on file prior to visit.     Review of Systems:  All pertinent positive/negative included in HPI, all other review of systems are negative   Objective:  Physical Exam BP 105/68   Pulse 79   Wt 129 lb (58.5 kg)   BMI 23.59 kg/m  CONSTITUTIONAL: Well-developed, well-nourished female in no acute distress.  EYES: EOM intact ENT: Normocephalic CARDIOVASCULAR: Regular rate  RESPIRATORY: Normal rate.  MUSCULOSKELETAL: Normal ROM SKIN: Warm, dry without erythema  NEUROLOGICAL: Alert, oriented, CN II-XII grossly intact, Appropriate balance PSYCH: Normal behavior, mood   Assessment & Plan:  Assessment: 1. Migraine without aura and without status migrainosus, not intractable   2. Muscle spasm      Plan: Continue Nurtec.  Patient interested in restarting botox as the nurtec is not fully able to control her headaches.  She is changing insurance coverage in August and will wait until that time to pursue this again.   She notes she will be following up with her GYN regarding symptoms of menopause.  She is encouraged to discuss Effexor which could help both headaches and hot flashes.   Follow-up in 2-3 months or sooner PRN  Paticia Stack, PA-C 07/04/2022 10:05 AM

## 2022-07-08 ENCOUNTER — Other Ambulatory Visit: Payer: Self-pay | Admitting: Obstetrics & Gynecology

## 2022-07-08 DIAGNOSIS — Z1231 Encounter for screening mammogram for malignant neoplasm of breast: Secondary | ICD-10-CM

## 2022-07-17 DIAGNOSIS — E161 Other hypoglycemia: Secondary | ICD-10-CM | POA: Diagnosis not present

## 2022-07-17 DIAGNOSIS — E039 Hypothyroidism, unspecified: Secondary | ICD-10-CM | POA: Diagnosis not present

## 2022-07-17 DIAGNOSIS — Z13220 Encounter for screening for lipoid disorders: Secondary | ICD-10-CM | POA: Diagnosis not present

## 2022-07-28 ENCOUNTER — Ambulatory Visit
Admission: RE | Admit: 2022-07-28 | Discharge: 2022-07-28 | Disposition: A | Discharge status: Home / Self Care | Payer: BC Managed Care – PPO | Source: Ambulatory Visit | Attending: Obstetrics & Gynecology | Admitting: Obstetrics & Gynecology

## 2022-07-28 DIAGNOSIS — Z1231 Encounter for screening mammogram for malignant neoplasm of breast: Secondary | ICD-10-CM

## 2022-07-29 ENCOUNTER — Telehealth: Payer: Self-pay | Admitting: Cardiology

## 2022-07-29 NOTE — Telephone Encounter (Signed)
The patient has some SOB and called me to review her lipids.  She has total cholesterol 301 and LDL 204.     She is establishing with me as a new patient.

## 2022-08-03 ENCOUNTER — Encounter: Payer: Self-pay | Admitting: Cardiology

## 2022-08-04 ENCOUNTER — Other Ambulatory Visit: Payer: Self-pay | Admitting: *Deleted

## 2022-08-04 ENCOUNTER — Encounter: Payer: Self-pay | Admitting: *Deleted

## 2022-08-04 ENCOUNTER — Encounter: Payer: Self-pay | Admitting: Internal Medicine

## 2022-08-04 ENCOUNTER — Other Ambulatory Visit: Payer: Self-pay | Admitting: Internal Medicine

## 2022-08-04 DIAGNOSIS — K51519 Left sided colitis with unspecified complications: Secondary | ICD-10-CM

## 2022-08-04 DIAGNOSIS — Z136 Encounter for screening for cardiovascular disorders: Secondary | ICD-10-CM

## 2022-08-04 MED ORDER — YUSIMRY 40 MG/0.8ML ~~LOC~~ SOPN
40.0000 mg | PEN_INJECTOR | SUBCUTANEOUS | 5 refills | Status: DC
Start: 2022-08-04 — End: 2022-09-05

## 2022-08-04 MED ORDER — NURTEC 75 MG PO TBDP: 75.0000 mg | ORAL_TABLET | ORAL | 2 refills | Status: DC

## 2022-08-05 ENCOUNTER — Telehealth: Payer: Self-pay | Admitting: Pharmacy Technician

## 2022-08-05 ENCOUNTER — Other Ambulatory Visit: Payer: Self-pay | Admitting: *Deleted

## 2022-08-05 ENCOUNTER — Other Ambulatory Visit (HOSPITAL_COMMUNITY): Payer: Self-pay

## 2022-08-05 MED ORDER — BOTOX 200 UNITS IJ SOLR: 200.0000 [IU] | INTRAMUSCULAR | 2 refills | Status: DC

## 2022-08-05 NOTE — Telephone Encounter (Signed)
Left message for pt to call back  °

## 2022-08-05 NOTE — Telephone Encounter (Signed)
Patient is returning your call.  

## 2022-08-05 NOTE — Progress Notes (Signed)
Needing Botox RX printed to fax to [facility] for PA

## 2022-08-05 NOTE — Telephone Encounter (Signed)
Patient Advocate Encounter  Received notification from [facility] that prior authorization for [place] 40MG/0.8ML is required.   PA NOT SUBMITTED This Product/Service is Not Covered - Plan/Benefit Exclusion. Plan alternatively covers HUMIRA.  Luciano Cutter, CPhT Patient Advocate Phone: [phone]

## 2022-08-06 ENCOUNTER — Encounter: Payer: Self-pay | Admitting: *Deleted

## 2022-08-06 ENCOUNTER — Encounter: Payer: Self-pay | Admitting: Internal Medicine

## 2022-08-06 NOTE — Telephone Encounter (Signed)
Patient returned your call, please advise. 

## 2022-08-06 NOTE — Telephone Encounter (Signed)
Pt notified of the PA denial for the [place] and that her insurance with cover the Humira:  Pt stated that she was going to call her insurance company and she would my chart message [place]:  Pt verbalized understanding with all questions answered.

## 2022-08-07 NOTE — Telephone Encounter (Signed)
Fax received: Faxed completed and faxed back along with pt records. Pt made aware through my chart message:

## 2022-08-15 ENCOUNTER — Other Ambulatory Visit: Payer: Self-pay | Admitting: *Deleted

## 2022-08-20 ENCOUNTER — Telehealth: Payer: Self-pay

## 2022-08-20 NOTE — Telephone Encounter (Signed)
Return call to [facility] to confirm shipping address for Botox Spoke with Quillian Quince and confirmed to ship now  Pt has upcoming appt 09/12/22.

## 2022-08-27 ENCOUNTER — Ambulatory Visit (HOSPITAL_BASED_OUTPATIENT_CLINIC_OR_DEPARTMENT_OTHER)
Admission: RE | Admit: 2022-08-27 | Discharge: 2022-08-27 | Disposition: A | Discharge status: Home / Self Care | Payer: BC Managed Care – PPO | Source: Ambulatory Visit | Attending: Cardiology | Admitting: Cardiology

## 2022-08-27 DIAGNOSIS — Z136 Encounter for screening for cardiovascular disorders: Secondary | ICD-10-CM | POA: Insufficient documentation

## 2022-08-28 ENCOUNTER — Other Ambulatory Visit: Payer: Self-pay | Admitting: *Deleted

## 2022-08-28 ENCOUNTER — Ambulatory Visit: Payer: BC Managed Care – PPO | Admitting: Cardiology

## 2022-08-28 DIAGNOSIS — E785 Hyperlipidemia, unspecified: Secondary | ICD-10-CM

## 2022-09-02 ENCOUNTER — Ambulatory Visit: Payer: BC Managed Care – PPO | Admitting: Cardiology

## 2022-09-04 ENCOUNTER — Encounter: Payer: Self-pay | Admitting: Internal Medicine

## 2022-09-05 ENCOUNTER — Telehealth: Payer: Self-pay | Admitting: Internal Medicine

## 2022-09-05 MED ORDER — AMJEVITA 40 MG/0.8ML ~~LOC~~ SOAJ
40.0000 mg | SUBCUTANEOUS | 5 refills | Status: DC
Start: 2022-09-05 — End: 2023-04-28

## 2022-09-05 MED ORDER — AMJEVITA 40 MG/0.8ML ~~LOC~~ SOAJ: 40.0000 mg | SUBCUTANEOUS | 5 refills | Status: DC

## 2022-09-05 NOTE — Telephone Encounter (Signed)
The Yusimry biosimilar was not available so Amjevita has been chosen by her insurance company to replace Humira.  I will send the prescription.

## 2022-09-09 ENCOUNTER — Telehealth: Payer: Self-pay

## 2022-09-09 NOTE — Telephone Encounter (Signed)
Received faxed documents from [facility] for prescription for the patients Amjevita:  Prescription was completed and signed: Faxed to [phone]

## 2022-09-12 ENCOUNTER — Encounter: Payer: Self-pay | Admitting: Internal Medicine

## 2022-09-12 ENCOUNTER — Ambulatory Visit: Payer: PRIVATE HEALTH INSURANCE | Attending: Internal Medicine | Admitting: Internal Medicine

## 2022-09-12 ENCOUNTER — Encounter: Payer: Self-pay | Admitting: Physician Assistant

## 2022-09-12 ENCOUNTER — Telehealth: Payer: Self-pay | Admitting: Internal Medicine

## 2022-09-12 ENCOUNTER — Ambulatory Visit: Payer: PRIVATE HEALTH INSURANCE | Admitting: Physician Assistant

## 2022-09-12 VITALS — BP 105/71 | HR 75 | Ht 62.0 in | Wt 129.4 lb

## 2022-09-12 VITALS — BP 106/71 | HR 73 | Wt 128.0 lb

## 2022-09-12 DIAGNOSIS — E559 Vitamin D deficiency, unspecified: Secondary | ICD-10-CM

## 2022-09-12 DIAGNOSIS — G43709 Chronic migraine without aura, not intractable, without status migrainosus: Secondary | ICD-10-CM

## 2022-09-12 DIAGNOSIS — M62838 Other muscle spasm: Secondary | ICD-10-CM | POA: Diagnosis not present

## 2022-09-12 DIAGNOSIS — E038 Other specified hypothyroidism: Secondary | ICD-10-CM | POA: Diagnosis not present

## 2022-09-12 DIAGNOSIS — E7849 Other hyperlipidemia: Secondary | ICD-10-CM | POA: Diagnosis not present

## 2022-09-12 MED ORDER — ONABOTULINUMTOXINA 100 UNITS IJ SOLR
200.0000 [IU] | Freq: Once | INTRAMUSCULAR | Status: AC
  Administered 2022-09-12: 200 [IU] via INTRAMUSCULAR

## 2022-09-12 MED ORDER — ROSUVASTATIN CALCIUM 20 MG PO TABS: 20.0000 mg | ORAL_TABLET | Freq: Every day | ORAL | 3 refills | Status: DC

## 2022-09-12 NOTE — Progress Notes (Signed)
Patient here for HA management and Botox injection.    Pt states Nurtec helps. HA's are constant in Spring and Fall pt feels one coming on now.    S: Pt in office today for Botox injections. Her Botox previously worked very well for migraine prevention and today she is restarting after a trial off this treatment. She is using Nurtec every other day and having many breakthrough HA.     O: BP 106/71   Pulse 73   Wt 128 lb (58.1 kg)   BMI 23.41 kg/m    Botox Procedure Note   Botox Dosing by [facility] for Chronic Migraine   Injection Sites for Migraines  Botox 155 units was injected using the dosage in the table above in the pattern shown above.  A: Migraine  Muscle spasm   P: Botox 155 units injected today.  RTC 3 Months.

## 2022-09-12 NOTE — Progress Notes (Signed)
LIPID CLINIC CONSULT NOTE  Chief Complaint:  Familial hyperlipidemia  Primary Care Physician: Charlane Ferretti, MD  Primary Cardiologist:  None  HPI:  Shelly Wilkinson is a 51 y.o. female who is being seen today for the evaluation of familial hyperlipidemia at the request of Dr. Percival Spanish.  Shelly Wilkinson is a pleasant female who is establishing care for management of dyslipidemia.  She has known about high cholesterol for some time however more recently her lipids have increased significantly.  In July 2023 her total cholesterol was 301, triglycerides of 88, HDL 83 and LDL 204.  While she does have a high HDL cholesterol, her LDL is significantly elevated suggestive of a familial hyperlipidemia.  She notes that there is a strong family history of heart disease including her father who had significant heart disease and high cholesterol is noted in multiple family members.  She has not been on treatment for this.  She also has hypothyroidism and vitamin D deficiency both of which have been treated.  She suffers from Crohn's colitis and has had hypoglycemia.  She recently had a coronary calcium score which fortunately was 0.  She is here today to discuss options for regarding her dyslipidemia.  She reports a very healthy diet low in saturated fats and tries to avoid triggers for high cholesterol.  She also is physically active and recently has been exercising at the [facility] fitness center.  PMHx:  Past Medical History:  Diagnosis Date   Allergy    Anemia, iron deficiency 08/25/2016   Anxiety    Arthritis    Choroidal nevus    Chronic anal fissure    Complication of anesthesia    headaches/   06-22-2014 surg. had urinary retention   Constipation due to outlet dysfunction    Dyssynergic constipation 10/01/2016   Anorectal mano   Episcleritis-uveitis? 08/2021   GERD (gastroesophageal reflux disease)    Hyperinsulinemic hypoglycemia    Hyperlipidemia    Hypoglycemia 12/10/2021   Hypothyroidism     IBS (irritable bowel syndrome)    IC (interstitial cystitis)    Indeterminate colitis 11/18/2016   Macular degeneration    Migraines    Thrombosed hemorrhoids    Vitamin D deficiency 11/19/2016   Wears glasses     Past Surgical History:  Procedure Laterality Date   ANAL RECTAL MANOMETRY N/A 09/17/2016   Procedure: ANO RECTAL MANOMETRY;  Surgeon: Gatha Mayer, MD;  Location: [facility];  Service: Endoscopy;  Laterality: N/A;   CHEILECTOMY Right 12/15/2013   Procedure: RIGHT HALLUX CHEILECTOMY;  Surgeon: Wylene Simmer, MD;  Location: [facility];  Service: Orthopedics;  Laterality: Right;   CHEILECTOMY Left 02-12-2006   great toe   COLONOSCOPY  10-02-2010   CYSTO/  HYDRODISTENTION/  INSTILLATION THERAPY  03-19-2010   D & C HYSTEROSCOPY /  HYDROTHERMAL ENDOMETRIAL ABLATION/  LEEP  05-29-2011   DILATION AND EVACUATION  2002   ESOPHAGOGASTRODUODENOSCOPY (EGD) WITH PROPOFOL N/A 04/17/2022   Procedure: ESOPHAGOGASTRODUODENOSCOPY (EGD) WITH PROPOFOL;  Surgeon: Milus Banister, MD;  Location: [facility];  Service: Gastroenterology;  Laterality: N/A;   EUS N/A 04/17/2022   Procedure: UPPER ENDOSCOPIC ULTRASOUND (EUS) RADIAL;  Surgeon: Milus Banister, MD;  Location: [facility];  Service: Gastroenterology;  Laterality: N/A;   EVALUATION UNDER ANESTHESIA WITH ANAL FISTULECTOMY N/A 12/26/2014   Procedure: EXAM UNDER ANESTHESIA ;  Surgeon: Leighton Ruff, MD;  Location: [facility];  Service: General;  Laterality: N/A;   HEMORRHOID BANDING  02-16-2014  REPAIR LACERATED RADIAL DIGITAL NERVE LEFT RING FINGER  08-25-2005   SPHINCTEROTOMY N/A 12/26/2014   Procedure: CHEMICAL SPHINCTEROTOMY (BOTOX);  Surgeon: Leighton Ruff, MD;  Location: [facility];  Service: General;  Laterality: N/A;   TRANSANAL HEMORRHOIDAL DEARTERIALIZATION  06-22-2014    FAMHx:  Family History  Problem Relation Age of Onset   Diabetes Father    Heart disease Father     Heart attack Father    Hypertension Father    Colitis Father    Stroke Father    Rheum arthritis Maternal Grandmother    Heart attack Paternal Grandmother    Lung cancer Paternal Grandfather    Colon cancer Maternal Aunt 65   Stomach cancer Neg Hx    Esophageal cancer Neg Hx    Pancreatic cancer Neg Hx     SOCHx:   reports that she has never smoked. She has never used smokeless tobacco. She reports current alcohol use. She reports that she does not use drugs.  ALLERGIES:  Allergies  Allergen Reactions   Sulfa Antibiotics Hives, Nausea And Vomiting and Other (See Comments)    And fever    ROS: Pertinent items noted in HPI and remainder of comprehensive ROS otherwise negative.  HOME MEDS: Current Outpatient Medications on File Prior to Visit  Medication Sig Dispense Refill   Adalimumab-atto (AMJEVITA) 40 MG/0.8ML SOAJ Inject 40 mg into the skin every 14 (fourteen) days. 1.6 mL 5   ALOE VERA PO Take 1-2 capsules by mouth See admin instructions. Take 1 capsule in the AM and 2 capsules in the PM.     botulinum toxin Type A (BOTOX) 200 units injection Inject 200 Units into the muscle every 3 (three) months. 1 each 2   cholecalciferol (VITAMIN D) 1000 units tablet Take 1,000 Units by mouth daily.     CVS SUNSCREEN SPF 30 EX apply topically to face and body daily for 30     cyclobenzaprine (FLEXERIL) 10 MG tablet Take 1 tablet (10 mg total) by mouth every 8 (eight) hours as needed for muscle spasms. (Patient taking differently: Take 2.5 mg by mouth every 8 (eight) hours as needed (migraine headache).) 40 tablet 1   cycloSPORINE (RESTASIS) 0.05 % ophthalmic emulsion Place 1 drop into both eyes 2 (two) times daily.     DULoxetine (CYMBALTA) 60 MG capsule TAKE 1 CAPSULE(60 MG) BY MOUTH DAILY 90 capsule 3   ibuprofen (ADVIL,MOTRIN) 200 MG tablet Take 600 mg by mouth every 6 (six) hours as needed for mild pain. Patient takes as needed     levothyroxine (SYNTHROID) 75 MCG tablet Take 75 mcg  by mouth daily before breakfast.     Multiple Vitamins-Minerals (PRESERVISION AREDS PO) Take 1 tablet by mouth in the morning and at bedtime.     Rimegepant Sulfate (NURTEC) 75 MG TBDP Take 75 mg by mouth every other day. 16 tablet 2   tazarotene (TAZORAC) 0.05 % cream Apply 1 application. topically at bedtime.     No current facility-administered medications on file prior to visit.    LABS/IMAGING: No results found for this or any previous visit (from the past 48 hour(s)). No results found.  LIPID PANEL: No results found for: "CHOL", "TRIG", "HDL", "CHOLHDL", "VLDL", "LDLCALC", "LDLDIRECT"  WEIGHTS: Wt Readings from Last 3 Encounters:  09/12/22 129 lb 6.4 oz (58.7 kg)  09/12/22 128 lb (58.1 kg)  07/04/22 129 lb (58.5 kg)    VITALS: BP 105/71   Pulse 75   Ht 5' 2"  (1.575  m)   Wt 129 lb 6.4 oz (58.7 kg)   SpO2 99%   BMI 23.67 kg/m   EXAM: General appearance: alert and no distress Neck: no carotid bruit, no JVD, and thyroid not enlarged, symmetric, no tenderness/mass/nodules Lungs: clear to auscultation bilaterally Heart: regular rate and rhythm, S1, S2 normal, no murmur, click, rub or gallop Abdomen: soft, non-tender; bowel sounds normal; no masses,  no organomegaly Extremities: extremities normal, atraumatic, no cyanosis or edema Pulses: 2+ and symmetric Skin: Skin color, texture, turgor normal. No rashes or lesions Neurologic: Grossly normal Psych: Pleasant  EKG: Deferred  ASSESSMENT: Probable familial hyperlipidemia, LDL greater than 190 Multiple family members with high cholesterol in father with coronary artery disease Hypothyroidism Vitamin D deficiency Crohn's disease 0 CAC score (07/2022)  PLAN: 1.   Mrs. Matzen has a probable familial hyperlipidemia with a high LDL cholesterol well over 190 and total cholesterol over 300.  She reports multiple family members with high cholesterol in her father with coronary artery disease.  Fortunately she had 0 coronary  calcium but could have noncalcified coronary disease and is still potentially at risk for ASCVD.  I would like to further understand her increased coronary risk and would recommend genetic test today.  We discussed this in detail and the fact that it is likely to be covered by insurance but if not could result in a $299 charge.  Additionally, I would recommend high intensity statin therapy since she has not previously been on treatment.  We will start rosuvastatin 20 mg daily.  She should reach out if she has any of the side effects which we discussed during the visit and we could consider other options.  We will plan a follow-up lipid NMR and LP(a) in 3 to 4 months.  Plan follow-up with me at that time.  Thanks again for the kind referral.   Pixie Casino, MD, FACC, [place] Director of the [facility]  [facility] Diplomate of the American Board of Clinical Lipidology Attending Cardiologist  Direct Dial: [phone]  Fax: [phone]  Website:  [email or web address] 09/12/2022, 4:21 PM

## 2022-09-12 NOTE — Telephone Encounter (Signed)
Genetic test for dyslipidemia/ASCVD panel ordered ([facility]) Cheek swab completed in office Specimen and necessary paperwork mailed. ID: [id number]

## 2022-09-12 NOTE — Patient Instructions (Signed)
Medication Instructions:   START rosuvastatin (crestor) 60m daily  *If you need a refill on your cardiac medications before your next appointment, please call your pharmacy*   Lab Work:  FASTING lab work to check cholesterol in 3-4 months  If you have labs (blood work) drawn today and your tests are completely normal, you will receive your results only by: MSpencer(if you have MyChart) OR A paper copy in the mail If you have any lab test that is abnormal or we need to change your treatment, we will call you to review the results.   Testing/Procedures:  Genetic Test - results available in 2-3 weeks   Follow-Up: [facility] and your health needs are our priority.  As part of our continuing mission to provide you with exceptional heart care, we have created designated Provider Care Teams.  These Care Teams include your primary Cardiologist (physician) and Advanced Practice Providers (APPs -  Physician Assistants and Nurse Practitioners) who all work together to provide you with the care you need, when you need it.  We recommend signing up for the patient portal called "MyChart".  Sign up information is provided on this After Visit Summary.  MyChart is used to connect with patients for Virtual Visits (Telemedicine).  Patients are able to view lab/test results, encounter notes, upcoming appointments, etc.  Non-urgent messages can be sent to your provider as well.   To learn more about what you can do with MyChart, go to [email or web address]    Your next appointment:    3-4 months with Dr. HDebara Pickett

## 2022-10-03 ENCOUNTER — Encounter: Payer: Self-pay | Admitting: Internal Medicine

## 2022-10-03 ENCOUNTER — Other Ambulatory Visit: Payer: Self-pay | Admitting: Internal Medicine

## 2022-10-03 ENCOUNTER — Other Ambulatory Visit: Payer: Self-pay | Admitting: *Deleted

## 2022-10-03 DIAGNOSIS — Z79899 Other long term (current) drug therapy: Secondary | ICD-10-CM

## 2022-10-30 ENCOUNTER — Other Ambulatory Visit: Payer: Self-pay | Admitting: Physician Assistant

## 2022-11-03 ENCOUNTER — Other Ambulatory Visit: Payer: Self-pay | Admitting: *Deleted

## 2022-11-03 ENCOUNTER — Other Ambulatory Visit: Payer: Self-pay | Admitting: Internal Medicine

## 2022-11-03 MED ORDER — NURTEC 75 MG PO TBDP: 75.0000 mg | ORAL_TABLET | ORAL | 2 refills | Status: DC

## 2022-11-03 NOTE — Telephone Encounter (Signed)
Please advise Sir, thank you. 

## 2022-11-12 ENCOUNTER — Telehealth: Payer: Self-pay

## 2022-11-12 NOTE — Telephone Encounter (Signed)
Second attempt to call pharmacy back to confirm pt next Botox injection date Was on hold over 15 mins  Per Rep we have to always call to confirm appt before Rx can be sent Appt confirmed and Rx will be sent 12/02/22 for 12/12/22 botox appt.

## 2022-11-19 ENCOUNTER — Telehealth: Payer: Self-pay

## 2022-11-19 NOTE — Telephone Encounter (Signed)
Left vm for pt to call and schedule headache follow up.

## 2022-11-24 ENCOUNTER — Telehealth: Payer: Self-pay

## 2022-11-24 NOTE — Telephone Encounter (Signed)
Return call from [facility] stating there was a error in confirming Ins for Botox  Pt does have insurance and Rx will be shipped  Address to office was confirmed and Rx will be sent 12/01/22.  [place] w/ [facility].

## 2022-11-24 NOTE — Telephone Encounter (Signed)
Call from [facility] that pt does not have ins coverage and Botox will not be shipped.

## 2022-12-12 ENCOUNTER — Encounter: Payer: Self-pay | Admitting: Physician Assistant

## 2022-12-12 ENCOUNTER — Ambulatory Visit (INDEPENDENT_AMBULATORY_CARE_PROVIDER_SITE_OTHER): Payer: No Typology Code available for payment source | Admitting: Physician Assistant

## 2022-12-12 VITALS — BP 107/68 | Wt 130.0 lb

## 2022-12-12 DIAGNOSIS — G43709 Chronic migraine without aura, not intractable, without status migrainosus: Secondary | ICD-10-CM | POA: Diagnosis not present

## 2022-12-12 MED ORDER — ONABOTULINUMTOXINA 200 UNITS IJ SOLR
200.0000 [IU] | Freq: Once | INTRAMUSCULAR | Status: AC
  Administered 2022-12-12: 200 [IU] via INTRAMUSCULAR

## 2022-12-12 NOTE — Progress Notes (Signed)
Patient presents for Botox injection.  S: Pt in office today for Botox injections. Her Botox has been working very well for migraine prevention. She notes nurtec helps - but she would need it every single day.     O: BP 107/68   Wt 130 lb (59 kg)   BMI 23.78 kg/m    Botox Procedure Note Vial of Botox was : Supplied by Patient   Botox Dosing by [facility] Group for Chronic Migraine   Injection Sites for Migraines  Botox 155 units was injected using the dosage in the table above in the pattern shown above.45 units wasted  A: Migraine  Muscle spasm   P: Botox 155 units injected today.  RTC 3 Months.

## 2023-01-07 LAB — HEPATIC FUNCTION PANEL
ALT: 59 IU/L — ABNORMAL HIGH (ref 0–32)
AST: 37 IU/L (ref 0–40)
Albumin: 4.2 g/dL (ref 3.8–4.9)
Alkaline Phosphatase: 108 IU/L (ref 44–121)
Bilirubin Total: 1.1 mg/dL (ref 0.0–1.2)
Bilirubin, Direct: 0.29 mg/dL (ref 0.00–0.40)
Total Protein: 6.9 g/dL (ref 6.0–8.5)

## 2023-01-08 LAB — NMR, LIPOPROFILE
Cholesterol, Total: 163 mg/dL (ref 100–199)
HDL Particle Number: 40.9 umol/L (ref >=30.5)
HDL-C: 80 mg/dL (ref >39)
LDL Particle Number: 624 nmol/L (ref <1000)
LDL Size: 20.9 nm (ref >20.5)
LDL-C (NIH Calc): 71 mg/dL (ref 0–99)
LP-IR Score: <25 (ref <=45)
Small LDL Particle Number: <90 nmol/L (ref <=527)
Triglycerides: 60 mg/dL (ref 0–149)

## 2023-01-08 LAB — LIPOPROTEIN A (LPA): Lipoprotein (a): 29.5 nmol/L (ref <75.0)

## 2023-01-13 ENCOUNTER — Telehealth: Payer: BC Managed Care – PPO | Admitting: Internal Medicine

## 2023-01-19 ENCOUNTER — Telehealth: Payer: Self-pay

## 2023-01-19 NOTE — Telephone Encounter (Signed)
Received refill for Amjevita from [facility] Prescription filled out, signed and faxed back

## 2023-01-22 ENCOUNTER — Encounter: Payer: Self-pay | Admitting: Internal Medicine

## 2023-01-22 ENCOUNTER — Ambulatory Visit: Payer: No Typology Code available for payment source | Attending: Internal Medicine | Admitting: Internal Medicine

## 2023-01-22 VITALS — BP 98/60 | HR 72 | Ht 62.0 in | Wt 133.0 lb

## 2023-01-22 DIAGNOSIS — E7849 Other hyperlipidemia: Secondary | ICD-10-CM | POA: Diagnosis not present

## 2023-01-22 DIAGNOSIS — E785 Hyperlipidemia, unspecified: Secondary | ICD-10-CM

## 2023-01-22 DIAGNOSIS — R748 Abnormal levels of other serum enzymes: Secondary | ICD-10-CM | POA: Diagnosis not present

## 2023-01-22 NOTE — Progress Notes (Signed)
LIPID CLINIC CONSULT NOTE  Chief Complaint:  Familial hyperlipidemia  Primary Care Physician: Shelly Ferretti, MD  Primary Cardiologist:  None  HPI:  Shelly Wilkinson is a 52 y.o. female who is being seen today for the evaluation of familial hyperlipidemia at the request of Dr. Percival Wilkinson.  Shelly Wilkinson is a pleasant female who is establishing care for management of dyslipidemia.  She has known about high cholesterol for some time however more recently her lipids have increased significantly.  In July 2023 her total cholesterol was 301, triglycerides of 88, HDL 83 and LDL 204.  While she does have a high HDL cholesterol, her LDL is significantly elevated suggestive of a familial hyperlipidemia.  She notes that there is a strong family history of heart disease including her father who had significant heart disease and high cholesterol is noted in multiple family members.  She has not been on treatment for this.  She also has hypothyroidism and vitamin D deficiency both of which have been treated.  She suffers from Crohn's colitis and has had hypoglycemia.  She recently had a coronary calcium score which fortunately was 0.  She is here today to discuss options for regarding her dyslipidemia.  She reports a very healthy diet low in saturated fats and tries to avoid triggers for high cholesterol.  She also is physically active and recently has been exercising at the [facility] fitness center.  01/22/2023  Shelly Wilkinson returns today for follow-up.  She has had marked improvement in her lipids on rosuvastatin.  Initially she had some possible GI upset with this however that improved a lot and she has subsequently been able to take the medicine.  Her cholesterols come down substantially.  Her LDL particle number is now down to 624.  LDL of 71, HDL 80 and triglycerides 60.  Her LP(a) is negative at 29.5 nmol/L.  Her ALT is mildly elevated at 59 this is up from previous readings.  She did report that she has family  history of fatty liver disease in her brother and mother I believe.  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   PMHx:  Past Medical History:  Diagnosis Date   Allergy    Anemia, iron deficiency 08/25/2016   Anxiety    Arthritis    Choroidal nevus    Chronic anal fissure    Complication of anesthesia    headaches/   06-22-2014 surg. had urinary retention   Constipation due to outlet dysfunction    Dyssynergic constipation 10/01/2016   Anorectal mano   Episcleritis-uveitis? 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   Macular degeneration    Migraines    Thrombosed hemorrhoids    Vitamin D deficiency 11/19/2016   Wears glasses     Past Surgical History:  Procedure Laterality Date   ANAL RECTAL MANOMETRY N/A 09/17/2016   Procedure: ANO RECTAL MANOMETRY;  Surgeon: Gatha Mayer, MD;  Location: [facility];  Service: Endoscopy;  Laterality: N/A;   CHEILECTOMY Right 12/15/2013   Procedure: RIGHT HALLUX CHEILECTOMY;  Surgeon: Wylene Simmer, MD;  Location: [facility];  Service: Orthopedics;  Laterality: Right;   CHEILECTOMY Left 02-12-2006   great toe   COLONOSCOPY  10-02-2010   CYSTO/  HYDRODISTENTION/  INSTILLATION THERAPY  03-19-2010   D & C HYSTEROSCOPY /  HYDROTHERMAL ENDOMETRIAL ABLATION/  LEEP  05-29-2011   DILATION AND EVACUATION  2002   ESOPHAGOGASTRODUODENOSCOPY (EGD) WITH PROPOFOL N/A 04/17/2022   Procedure: ESOPHAGOGASTRODUODENOSCOPY (EGD) WITH PROPOFOL;  Surgeon: Milus Banister, MD;  Location: [facility];  Service:  Gastroenterology;  Laterality: N/A;   EUS N/A 04/17/2022   Procedure: UPPER ENDOSCOPIC ULTRASOUND (EUS) RADIAL;  Surgeon: Milus Banister, MD;  Location: [facility];  Service: Gastroenterology;  Laterality: N/A;   EVALUATION UNDER ANESTHESIA WITH ANAL FISTULECTOMY N/A 12/26/2014   Procedure: EXAM UNDER ANESTHESIA ;  Surgeon: Leighton Ruff, MD;  Location: [facility];  Service: General;  Laterality: N/A;   HEMORRHOID BANDING  02-16-2014   REPAIR LACERATED RADIAL DIGITAL NERVE LEFT RING FINGER  08-25-2005   SPHINCTEROTOMY N/A 12/26/2014   Procedure: CHEMICAL SPHINCTEROTOMY (BOTOX);  Surgeon: Leighton Ruff, MD;  [facility];  Service: General;  Laterality: N/A;   TRANSANAL HEMORRHOIDAL DEARTERIALIZATION  06-22-2014    FAMHx:  Family History  Problem Relation Age of Onset   Diabetes Father    Heart disease Father    Heart attack Father    Hypertension Father    Colitis Father    Stroke Father    Rheum arthritis Maternal Grandmother    Heart attack Paternal Grandmother    Lung cancer Paternal Grandfather    Colon cancer Maternal Aunt 65   Stomach cancer Neg Hx    Esophageal cancer Neg Hx    Pancreatic cancer Neg Hx     SOCHx:   reports that she has never smoked. She has never used smokeless tobacco. She reports current alcohol use. She reports that she does not use drugs.  ALLERGIES:  Allergies  Allergen Reactions   Sulfa Antibiotics Hives, Nausea And Vomiting and Other (See Comments)    And fever    ROS: Pertinent items noted in HPI and remainder of comprehensive ROS otherwise negative.  HOME MEDS: Current Outpatient Medications on File Prior to Visit  Medication Sig Dispense Refill   Adalimumab-atto (AMJEVITA) 40 MG/0.8ML SOAJ Inject 40 mg into the skin every 14 (fourteen) days. 1.6 mL 5   ALOE VERA PO Take 1-2 capsules by mouth See admin instructions. Take 1 capsule in the AM and 2 capsules in the PM.     BOTOX 200 units  injection Inject 200 units intramuscularly every 3 months **must be administered by a healthcare professional** 1 each 2   cholecalciferol (VITAMIN D) 1000 units tablet Take 1,000 Units by mouth daily.     [employer] SUNSCREEN SPF 30 EX apply topically to face and body daily for 30     cyclobenzaprine (FLEXERIL) 10 MG tablet Take 1 tablet (10 mg total) by mouth every 8 (eight) hours as needed for muscle spasms. (Patient taking differently: Take 2.5 mg by mouth every 8 (eight) hours as needed (migraine headache).) 40 tablet 1   cycloSPORINE (RESTASIS) 0.05 % ophthalmic emulsion Place 1 drop into both eyes 2 (two) times daily.     DULoxetine (CYMBALTA) 60 MG capsule TAKE 1 CAPSULE BY MOUTH DAILY 90 capsule 3   ibuprofen (ADVIL,MOTRIN) 200 MG tablet Take 600 mg by mouth every 6 (six) hours as needed for mild pain. Patient takes as needed     levothyroxine (  SYNTHROID) 75 MCG tablet Take 75 mcg by mouth daily before breakfast.     Multiple Vitamins-Minerals (PRESERVISION AREDS PO) Take 1 tablet by mouth in the morning and at bedtime.     Rimegepant Sulfate (NURTEC) 75 MG TBDP Take 75 mg by mouth every other day. 16 tablet 2   rosuvastatin (CRESTOR) 20 MG tablet Take 1 tablet (20 mg total) by mouth daily. 90 tablet 3   tazarotene (TAZORAC) 0.05 % cream Apply 1 application. topically at bedtime.     No current facility-administered medications on file prior to visit.    LABS/IMAGING: No results found for this or any previous visit (from the past 48 hour(s)). No results found.  LIPID PANEL: No results found for: "CHOL", "TRIG", "HDL", "CHOLHDL", "VLDL", "LDLCALC", "LDLDIRECT"  WEIGHTS: Wt Readings from Last 3 Encounters:  01/22/23 133 lb (60.3 kg)  12/12/22 130 lb (59 kg)  09/12/22 129 lb 6.4 oz (58.7 kg)    VITALS: BP 98/60   Pulse 72   Ht '5\' 2"'$  (1.575 m)   Wt 133 lb (60.3 kg)   SpO2 98%   BMI 24.33 kg/m   EXAM: General appearance: alert and no distress Neck: no carotid bruit, no JVD, and  thyroid not enlarged, symmetric, no tenderness/mass/nodules Lungs: clear to auscultation bilaterally Heart: regular rate and rhythm, S1, S2 normal, no murmur, click, rub or gallop Abdomen: soft, non-tender; bowel sounds normal; no masses,  no organomegaly Extremities: extremities normal, atraumatic, no cyanosis or edema Pulses: 2+ and symmetric Skin: Skin color, texture, turgor normal. No rashes or lesions Neurologic: Grossly normal Psych: Pleasant  EKG: Deferred  ASSESSMENT: Probable familial hyperlipidemia, LDL greater than 190 -genetic testing confirmed abnormal APO B variant as well as PE MT variation, which increases the risk of fatty liver disease. Multiple family members with high cholesterol in father with coronary artery disease Hypothyroidism Vitamin D deficiency Crohn's disease 0 CAC score (07/2022) Elevated liver enzymes Negative LP(a)-29.5 nmol/L  PLAN: 1.   Shelly Wilkinson has had a marked improvement in her lipids on rosuvastatin.  She is now tolerating it well.  She has had some elevation in her liver enzymes with ALT up to 59.  I will need to follow this closely but would continue her current dosing.  Will repeat her enzymes in about 3 months and a lipid profile in 6 months.  She had a CT recently which showed no evidence of fatty liver however I suspect that she may have that.  She certainly has the gene variation and family members who also have fatty liver disease.  If her enzymes go up higher I would recommend an ultrasound of her liver to better understand this and further workup for that although I think it is related to her statin.  Follow-up in 6 months.  Pixie Casino, MD, [facility], [place] Director of the Advanced Lipid Disorders &  Cardiovascular Risk Reduction Clinic Diplomate of the American Board of Clinical Lipidology Attending Cardiologist  Direct Dial: [phone]  Fax: [phone]  Website:   [email or web address]  Shelly Wilkinson 01/22/2023, 3:14 PM

## 2023-01-22 NOTE — Patient Instructions (Addendum)
Medication Instructions:  Your physician recommends that you continue on your current medications as directed. Please refer to the Current Medication list given to you today.  *If you need a refill on your cardiac medications before your next appointment, please call your pharmacy*   Lab Work: Your physician recommends that you return for lab work in: 3 months for Liver Function  Your physician recommends that you return for lab work in: 6 months for FASTING NMR lipid panel (please have labs done at least 3-4 days prior to office visit)  If you have labs (blood work) drawn today and your tests are completely normal, you will receive your results only by: Leonardo (if you have MyChart) OR A paper copy in the mail If you have any lab test that is abnormal or we need to change your treatment, we will call you to review the results.   Follow-Up: At [facility], you and your health needs are our priority.  As part of our continuing mission to provide you with exceptional heart care, we have created designated Provider Care Teams.  These Care Teams include your primary Cardiologist (physician) and Advanced Practice Providers (APPs -  Physician Assistants and Nurse Practitioners) who all work together to provide you with the care you need, when you need it.  We recommend signing up for the patient portal called "MyChart".  Sign up information is provided on this After Visit Summary.  MyChart is used to connect with patients for Virtual Visits (Telemedicine).  Patients are able to view lab/test results, encounter notes, upcoming appointments, etc.  Non-urgent messages can be sent to your provider as well.   To learn more about what you can do with MyChart, go to [email or web address].    Your next appointment:   6 month(s)  Provider:   K. Mali Hilty, MD    Other Instructions Lipid Clinic

## 2023-01-27 ENCOUNTER — Telehealth: Payer: Self-pay | Admitting: *Deleted

## 2023-01-27 NOTE — Telephone Encounter (Signed)
Left patient a message to call and schedule appointment with Dr. Gala Romney.

## 2023-02-11 ENCOUNTER — Encounter: Payer: Self-pay | Admitting: Internal Medicine

## 2023-03-02 ENCOUNTER — Other Ambulatory Visit: Payer: Self-pay | Admitting: *Deleted

## 2023-03-02 MED ORDER — NURTEC 75 MG PO TBDP: 75.0000 mg | ORAL_TABLET | ORAL | 2 refills | Status: DC

## 2023-03-13 ENCOUNTER — Encounter: Payer: Self-pay | Admitting: Physician Assistant

## 2023-03-13 ENCOUNTER — Ambulatory Visit (INDEPENDENT_AMBULATORY_CARE_PROVIDER_SITE_OTHER): Payer: No Typology Code available for payment source | Admitting: Physician Assistant

## 2023-03-13 VITALS — BP 116/77 | HR 69 | Wt 130.4 lb

## 2023-03-13 DIAGNOSIS — G43709 Chronic migraine without aura, not intractable, without status migrainosus: Secondary | ICD-10-CM | POA: Diagnosis not present

## 2023-03-13 DIAGNOSIS — M62838 Other muscle spasm: Secondary | ICD-10-CM | POA: Diagnosis not present

## 2023-03-13 MED ORDER — ONABOTULINUMTOXINA 200 UNITS IJ SOLR
200.0000 [IU] | Freq: Once | INTRAMUSCULAR | Status: AC
  Administered 2023-03-13: 200 [IU] via INTRAMUSCULAR

## 2023-03-13 NOTE — Progress Notes (Signed)
CC: Botox  S: Pt in office today for Botox injections. Her Botox has been working very well for migraine prevention along with nurtec   O: BP 116/77   Pulse 69   Wt 130 lb 6.4 oz (59.1 kg)   BMI 23.85 kg/m    Botox Procedure Note Vial of Botox was : sent to office by pharmacy with patient's name  Botox Dosing by [employer] for Chronic Migraine   Injection Sites for Migraines  Botox 155 units was injected using the dosage in the table above in the pattern shown above. 45 units wasted  A: Migraine  Muscle spasm   P: Botox 155 units injected today.  RTC 3 Months.

## 2023-04-21 LAB — HEPATIC FUNCTION PANEL
ALT: 72 IU/L — ABNORMAL HIGH (ref 0–32)
AST: 45 IU/L — ABNORMAL HIGH (ref 0–40)
Albumin: 4.2 g/dL (ref 3.8–4.9)
Alkaline Phosphatase: 106 IU/L (ref 44–121)
Bilirubin Total: 1.2 mg/dL (ref 0.0–1.2)
Bilirubin, Direct: 0.28 mg/dL (ref 0.00–0.40)
Total Protein: 6.8 g/dL (ref 6.0–8.5)

## 2023-04-27 ENCOUNTER — Other Ambulatory Visit: Payer: Self-pay

## 2023-04-27 DIAGNOSIS — K51519 Left sided colitis with unspecified complications: Secondary | ICD-10-CM

## 2023-04-27 DIAGNOSIS — G43009 Migraine without aura, not intractable, without status migrainosus: Secondary | ICD-10-CM

## 2023-04-27 NOTE — Telephone Encounter (Signed)
Fax received from [employer] for refill request form for pt's Amjevita stating that it is no longer covered: Will cover Yusimry.  Please advise

## 2023-04-28 NOTE — Telephone Encounter (Signed)
I have pended the Rx - I was not sure of the specialty pharmacy tosend to - please find out and finish rx

## 2023-04-28 NOTE — Addendum Note (Signed)
Addended by: Iva Boop on: 04/28/2023 04:14 PM   Modules accepted: Orders

## 2023-04-29 MED ORDER — YUSIMRY 40 MG/0.8ML ~~LOC~~ SOPN: 40.0000 mg | PEN_INJECTOR | SUBCUTANEOUS | 5 refills | Status: DC

## 2023-04-29 NOTE — Addendum Note (Signed)
Addended by: Emeline Darling on: 04/29/2023 01:39 PM   Modules accepted: Orders

## 2023-04-29 NOTE — Telephone Encounter (Signed)
Prescription sent to pharmacy.

## 2023-06-01 ENCOUNTER — Other Ambulatory Visit: Payer: Self-pay | Admitting: *Deleted

## 2023-06-01 MED ORDER — NURTEC 75 MG PO TBDP: 75.0000 mg | ORAL_TABLET | ORAL | 2 refills | Status: DC

## 2023-06-12 ENCOUNTER — Encounter: Payer: Self-pay | Admitting: Physician Assistant

## 2023-06-12 ENCOUNTER — Ambulatory Visit (INDEPENDENT_AMBULATORY_CARE_PROVIDER_SITE_OTHER): Payer: No Typology Code available for payment source | Admitting: Physician Assistant

## 2023-06-12 VITALS — BP 103/68 | HR 79 | Wt 127.0 lb

## 2023-06-12 DIAGNOSIS — M62838 Other muscle spasm: Secondary | ICD-10-CM

## 2023-06-12 DIAGNOSIS — G43709 Chronic migraine without aura, not intractable, without status migrainosus: Secondary | ICD-10-CM

## 2023-06-12 MED ORDER — ONABOTULINUMTOXINA 200 UNITS IJ SOLR
200.0000 [IU] | Freq: Once | INTRAMUSCULAR | Status: AC
  Administered 2023-06-12: 200 [IU] via INTRAMUSCULAR

## 2023-06-12 NOTE — Progress Notes (Signed)
Patient here for Bojox Injection.  Notes doing well.  S: Pt in office today for Botox injections. Her Botox has been working very well for migraine prevention in addition to Nurtec.  She is noting the nurtec seems to last 1.5 days only.  She is heading to [place] for a riverboat cruise soon and requests samples of nurtec as her prescription PA is taking more time than expected.     O: BP 103/68   Pulse 79   Wt 127 lb (57.6 kg)   BMI 23.23 kg/m    Botox Procedure Note Vial of Botox was : sent direct to office from pharmacy labeled with her name- 200 unit vial  Botox Dosing by [employer] for Chronic Migraine   Injection Sites for Migraines  Botox 155 units was injected using the dosage in the table above in the pattern shown above.45 units wasted  A: Migraine  Muscle spasm   P: Botox 155 units injected today.  Nurtec samples provided Continue with massage as this has been helpful. RTC 3 Months.

## 2023-07-14 ENCOUNTER — Other Ambulatory Visit: Payer: Self-pay | Admitting: *Deleted

## 2023-07-14 MED ORDER — NURTEC 75 MG PO TBDP: 75.0000 mg | ORAL_TABLET | ORAL | 2 refills | Status: DC

## 2023-08-03 ENCOUNTER — Ambulatory Visit: Payer: No Typology Code available for payment source | Admitting: Internal Medicine

## 2023-08-06 ENCOUNTER — Ambulatory Visit: Payer: No Typology Code available for payment source | Attending: Internal Medicine | Admitting: Internal Medicine

## 2023-08-06 ENCOUNTER — Encounter: Payer: Self-pay | Admitting: Internal Medicine

## 2023-08-06 VITALS — Ht 62.0 in | Wt 125.0 lb

## 2023-08-06 DIAGNOSIS — E785 Hyperlipidemia, unspecified: Secondary | ICD-10-CM | POA: Diagnosis not present

## 2023-08-06 DIAGNOSIS — E7849 Other hyperlipidemia: Secondary | ICD-10-CM

## 2023-08-06 DIAGNOSIS — R748 Abnormal levels of other serum enzymes: Secondary | ICD-10-CM | POA: Diagnosis not present

## 2023-08-06 MED ORDER — ROSUVASTATIN CALCIUM 10 MG PO TABS: 10.0000 mg | ORAL_TABLET | Freq: Every day | ORAL | 3 refills | Status: DC

## 2023-08-06 NOTE — Patient Instructions (Signed)
Medication Instructions:  DECREASE rosuvastatin (crestor) to 10mg  daily ** new prescription sent to pharmacy ** you can also cut your 20mg  tablets in half to use what you have left  *If you need a refill on your cardiac medications before your next appointment, please call your pharmacy*   Lab Work: Non-Fasting hepatic function panel in 2-4 weeks (8/22 - 9/5)  Fasting NMR lipoprofile to check cholesterol in about 3-4 months (one week before next visit)   If you have labs (blood work) drawn today and your tests are completely normal, you will receive your results only by: MyChart Message (if you have MyChart) OR A paper copy in the mail If you have any lab test that is abnormal or we need to change your treatment, we will call you to review the results.   Follow-Up: At [facility], you and your health needs are our priority.  As part of our continuing mission to provide you with exceptional heart care, we have created designated Provider Care Teams.  These Care Teams include your primary Cardiologist (physician) and Advanced Practice Providers (APPs -  Physician Assistants and Nurse Practitioners) who all work together to provide you with the care you need, when you need it.  We recommend signing up for the patient portal called "MyChart".  Sign up information is provided on this After Visit Summary.  MyChart is used to connect with patients for Virtual Visits (Telemedicine).  Patients are able to view lab/test results, encounter notes, upcoming appointments, etc.  Non-urgent messages can be sent to your provider as well.   To learn more about what you can do with MyChart, go to [email or web address].    Your next appointment:    3-4 months with Dr. Rennis Golden

## 2023-08-06 NOTE — Progress Notes (Signed)
Virtual Visit via Video Note   Because of Kerissa Felkins Betts's co-morbid illnesses, she is at least at moderate risk for complications without adequate follow up.  This format is felt to be most appropriate for this patient at this time.  All issues noted in this document were discussed and addressed.  A limited physical exam was performed with this format.  Please refer to the patient's chart for her consent to telehealth for [facility].      Date:  08/06/2023   ID:  Calton Dach Bickford, DOB 1971-01-24, MRN [id number] The patient was identified using 2 identifiers.  Evaluation Performed:  Follow-Up Visit  Patient Location:  [address] [place] [address]  Provider location:   [address], [address] [place], [place] [address]  PCP:  Thana Ates, MD  Cardiologist:  None Electrophysiologist:  None   Chief Complaint:  Follow-up dyslipidemia  History of Present Illness:    Dixie Irion Thiemann is a 52 y.o. female who presents via audio/video conferencing for a telehealth visit today.  Kaisa Maciejewski Bateson is a 52 y.o. female who is being seen today for the evaluation of familial hyperlipidemia at the request of Dr. Antoine Poche.  Kadynce is a pleasant female who is establishing care for management of dyslipidemia.  She has known about high cholesterol for some time however more recently her lipids have increased significantly.  In July 2023 her total cholesterol was 301, triglycerides of 88, HDL 83 and LDL 204.  While she does have a high HDL cholesterol, her LDL is significantly elevated suggestive of a familial hyperlipidemia.  She notes that there is a strong family history of heart disease including her father who had significant heart disease and high cholesterol is noted in multiple family members.  She has not been on treatment for this.  She also has hypothyroidism and vitamin D deficiency both of which have been treated.  She suffers from Crohn's colitis and has  had hypoglycemia.  She recently had a coronary calcium score which fortunately was 0.  She is here today to discuss options for regarding her dyslipidemia.  She reports a very healthy diet low in saturated fats and tries to avoid triggers for high cholesterol.  She also is physically active and recently has been exercising at the [facility] fitness center.  01/22/2023  Ms. Hemmerich returns today for follow-up.  She has had marked improvement in her lipids on rosuvastatin.  Initially she had some possible GI upset with this however that improved a lot and she has subsequently been able to take the medicine.  Her cholesterols come down substantially.  Her LDL particle number is now down to 624.  LDL of 71, HDL 80 and triglycerides 60.  Her LP(a) is negative at 29.5 nmol/L.  Her ALT is mildly elevated at 59 this is up from previous readings.  She did report that she has family history of fatty liver disease in her brother and mother I believe.  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  08/06/2023  Ms. Purtee is seen today for virtual follow-up.  She seems to be doing very well with regards to her lipids on rosuvastatin.  She did have liver enzyme abnormalities back in April with a small increase demonstrating an  AST of 45 and ALT of 72.  Her lipid profile in July however showed LDL particle number even lower at 599 with an LDL-C of 67 and HDL of 90 and triglycerides of 62.  She had 0 coronary calcium last year.  Prior CV studies:   The following studies were reviewed today:  Chart reviewed, labwork  PMHx:  Past Medical History:  Diagnosis Date   Allergy    Anemia, iron deficiency 08/25/2016   Anxiety    Arthritis    Choroidal nevus    Chronic anal fissure    Complication of  anesthesia    headaches/   06-22-2014 surg. had urinary retention   Constipation due to outlet dysfunction    Dyssynergic constipation 10/01/2016   Anorectal mano   Episcleritis-uveitis? 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   Left sided ulcerative colitis (? Crohn's) with complication (HCC) 11/18/2016   Diagnosed fall 2017. Indeterminate probably. Rectal involvement but patchy small ulcers. Initially chewed with Lialda and uceris was added. Changed to Humira May 2018. Prednisone started April 2018. Improved.     07/2022 Yusimry - biosimilar started   Macular degeneration    Migraines    Thrombosed hemorrhoids    Vitamin D deficiency 11/19/2016   Wears glasses     Past Surgical History:  Procedure Laterality Date   ANAL RECTAL MANOMETRY N/A 09/17/2016   Procedure: ANO RECTAL MANOMETRY;  Surgeon: Iva Boop, MD;  Location: [facility];  Service: Endoscopy;  Laterality: N/A;   CHEILECTOMY Right 12/15/2013   Procedure: RIGHT HALLUX CHEILECTOMY;  Surgeon: Toni Arthurs, MD;  Location: [facility];  Service: Orthopedics;  Laterality: Right;   CHEILECTOMY Left 02-12-2006   great toe   COLONOSCOPY  10-02-2010   CYSTO/  HYDRODISTENTION/  INSTILLATION THERAPY  03-19-2010   D & C HYSTEROSCOPY /  HYDROTHERMAL ENDOMETRIAL ABLATION/  LEEP  05-29-2011   DILATION AND EVACUATION  2002   ESOPHAGOGASTRODUODENOSCOPY (EGD) WITH PROPOFOL N/A 04/17/2022   Procedure: ESOPHAGOGASTRODUODENOSCOPY (EGD) WITH PROPOFOL;  Surgeon: Rachael Fee, MD;  Location: [facility];  Service: Gastroenterology;  Laterality: N/A;   EUS N/A 04/17/2022   Procedure: UPPER ENDOSCOPIC ULTRASOUND (EUS) RADIAL;  Surgeon: Rachael Fee, MD;  Location: [facility];  Service: Gastroenterology;  Laterality:  N/A;   EVALUATION UNDER ANESTHESIA WITH ANAL FISTULECTOMY N/A 12/26/2014   Procedure: EXAM UNDER ANESTHESIA ;  Surgeon: Romie Levee, MD;  Location: [facility];  Service: General;  Laterality: N/A;   HEMORRHOID BANDING  02-16-2014   REPAIR LACERATED RADIAL DIGITAL NERVE LEFT RING FINGER  08-25-2005   SPHINCTEROTOMY N/A 12/26/2014   Procedure: CHEMICAL SPHINCTEROTOMY (BOTOX);  Surgeon: Romie Levee, MD;  Location: [facility];  Service: General;  Laterality: N/A;   TRANSANAL HEMORRHOIDAL DEARTERIALIZATION  06-22-2014    FAMHx:  Family History  Problem Relation Age of Onset   Diabetes Father    Heart disease Father    Heart attack Father    Hypertension Father    Colitis Father    Stroke Father    Rheum arthritis Maternal Grandmother    Heart attack Paternal Grandmother    Lung cancer Paternal Grandfather    Colon cancer Maternal Aunt 12   Stomach cancer Neg Hx    Esophageal cancer Neg  Hx    Pancreatic cancer Neg Hx     SOCHx:   reports that she has never smoked. She has never used smokeless tobacco. She reports current alcohol use. She reports that she does not use drugs.  ALLERGIES:  Allergies  Allergen Reactions   Sulfa Antibiotics Hives, Nausea And Vomiting and Other (See Comments)    And fever    MEDS:  Current Meds  Medication Sig   Adalimumab-aqvh (YUSIMRY) 40 MG/0.8ML SOPN Inject 40 mg into the skin every 14 (fourteen) days.   ALOE VERA PO Take 1-2 capsules by mouth See admin instructions. Take 1 capsule in the AM and 2 capsules in the PM.   BOTOX 200 units injection Inject 200 units intramuscularly every 3 months **must be administered by a healthcare professional**   cholecalciferol (VITAMIN D) 1000 units tablet Take 1,000 Units by mouth daily.   [employer] SUNSCREEN SPF 30 EX apply topically to face and body daily for 30   cyclobenzaprine (FLEXERIL) 10 MG tablet Take 1 tablet (10 mg total) by mouth every 8 (eight) hours as needed for  muscle spasms. (Patient taking differently: Take 2.5 mg by mouth every 8 (eight) hours as needed (migraine headache).)   cycloSPORINE (RESTASIS) 0.05 % ophthalmic emulsion Place 1 drop into both eyes 2 (two) times daily.   DULoxetine (CYMBALTA) 60 MG capsule TAKE 1 CAPSULE BY MOUTH DAILY   ibuprofen (ADVIL,MOTRIN) 200 MG tablet Take 600 mg by mouth every 6 (six) hours as needed for mild pain. Patient takes as needed   levothyroxine (SYNTHROID) 75 MCG tablet Take 75 mcg by mouth daily before breakfast.   methylphenidate 27 MG PO TB24 Take 1 tablet by mouth daily.   Multiple Vitamins-Minerals (PRESERVISION AREDS PO) Take 1 tablet by mouth in the morning and at bedtime.   Rimegepant Sulfate (NURTEC) 75 MG TBDP Take 1 tablet (75 mg total) by mouth every other day.   tazarotene (TAZORAC) 0.05 % cream Apply 1 application. topically at bedtime.   [DISCONTINUED] rosuvastatin (CRESTOR) 20 MG tablet Take 1 tablet (20 mg total) by mouth daily.     ROS: Pertinent items noted in HPI and remainder of comprehensive ROS otherwise negative.  Labs/Other Tests and Data Reviewed:    Recent Labs: 04/20/2023: ALT 72   Recent Lipid Panel No results found for: "CHOL", "TRIG", "HDL", "CHOLHDL", "LDLCALC", "LDLDIRECT"  Wt Readings from Last 3 Encounters:  08/06/23 125 lb (56.7 kg)  06/12/23 127 lb (57.6 kg)  03/13/23 130 lb 6.4 oz (59.1 kg)     Exam:    Vital Signs:  Ht 5\' 2"  (1.575 m)   Wt 125 lb (56.7 kg)   BMI 22.86 kg/m    General appearance: alert and no distress Lungs: No visual respiratory difficulty Abdomen: Normal weight Extremities: extremities normal, atraumatic, no cyanosis or edema Neurologic: Grossly normal  ASSESSMENT & PLAN:    Probable familial hyperlipidemia, LDL greater than 190 -genetic testing confirmed abnormal APO B variant as well as PE MT variation, which increases the risk of fatty liver disease. Multiple family members with high cholesterol in father with coronary artery  disease Hypothyroidism Vitamin D deficiency Crohn's disease 0 CAC score (07/2022) Elevated liver enzymes Negative LP(a)-29.5 nmol/L  Ms. Noto has had some increase in her liver enzymes but has very well-controlled dyslipidemia.  At this point I think we need to strike a balance between this and I would advise decreasing her rosuvastatin from 20 to 10 mg daily.  Will recheck her liver enzymes  in a few weeks and plan to recheck lipids in a few months with follow-up at that time.  Patient Risk:   After full review of this patients clinical status, I feel that they are at least moderate risk at this time.  Time:   Today, I have spent 15 minutes with the patient with telehealth technology discussing dyslipidemia.     Medication Adjustments/Labs and Tests Ordered: Current medicines are reviewed at length with the patient today.  Concerns regarding medicines are outlined above.   Tests Ordered: Orders Placed This Encounter  Procedures   Hepatic function panel   NMR, lipoprofile    Medication Changes: Meds ordered this encounter  Medications   rosuvastatin (CRESTOR) 10 MG tablet    Sig: Take 1 tablet (10 mg total) by mouth daily.    Dispense:  90 tablet    Refill:  3    Disposition:  in 4 month(s)  Chrystie Nose, MD, [facility], FACP  [place]  [facility]  Medical Director of the [facility] Lipid [facility] &  Cardiovascular Risk Reduction Clinic Diplomate of the American Board of Clinical Lipidology Attending Cardiologist  Direct Dial: [phone]  Fax: [phone]  Website:  [email or web address]  Chrystie Nose, MD  08/06/2023 10:54 AM

## 2023-08-24 ENCOUNTER — Other Ambulatory Visit: Payer: Self-pay | Admitting: Medical Genetics

## 2023-08-24 DIAGNOSIS — Z006 Encounter for examination for normal comparison and control in clinical research program: Secondary | ICD-10-CM

## 2023-08-25 ENCOUNTER — Other Ambulatory Visit: Payer: Self-pay | Admitting: *Deleted

## 2023-08-25 MED ORDER — NURTEC 75 MG PO TBDP: 75.0000 mg | ORAL_TABLET | ORAL | 2 refills | Status: DC

## 2023-08-28 ENCOUNTER — Other Ambulatory Visit: Payer: Self-pay | Admitting: Obstetrics & Gynecology

## 2023-08-28 DIAGNOSIS — Z1231 Encounter for screening mammogram for malignant neoplasm of breast: Secondary | ICD-10-CM

## 2023-09-15 ENCOUNTER — Ambulatory Visit: Payer: No Typology Code available for payment source

## 2023-09-23 ENCOUNTER — Other Ambulatory Visit: Payer: Self-pay | Admitting: *Deleted

## 2023-09-23 MED ORDER — NURTEC 75 MG PO TBDP: 75.0000 mg | ORAL_TABLET | ORAL | 2 refills | Status: DC

## 2023-10-01 ENCOUNTER — Ambulatory Visit
Admission: RE | Admit: 2023-10-01 | Discharge: 2023-10-01 | Disposition: A | Discharge status: Home / Self Care | Payer: No Typology Code available for payment source | Source: Ambulatory Visit | Attending: Obstetrics & Gynecology | Admitting: Obstetrics & Gynecology

## 2023-10-01 DIAGNOSIS — Z1231 Encounter for screening mammogram for malignant neoplasm of breast: Secondary | ICD-10-CM

## 2023-10-02 ENCOUNTER — Ambulatory Visit: Payer: No Typology Code available for payment source | Admitting: Physician Assistant

## 2023-10-02 ENCOUNTER — Encounter: Payer: Self-pay | Admitting: Physician Assistant

## 2023-10-02 VITALS — BP 116/78 | HR 79 | Wt 129.0 lb

## 2023-10-02 DIAGNOSIS — G43709 Chronic migraine without aura, not intractable, without status migrainosus: Secondary | ICD-10-CM | POA: Diagnosis not present

## 2023-10-02 DIAGNOSIS — M62838 Other muscle spasm: Secondary | ICD-10-CM

## 2023-10-02 MED ORDER — ONABOTULINUMTOXINA 200 UNITS IJ SOLR
200.0000 [IU] | Freq: Once | INTRAMUSCULAR | Status: AC
Start: 2023-10-02 — End: 2023-10-02
  Administered 2023-10-02: 200 [IU] via INTRAMUSCULAR

## 2023-10-02 NOTE — Patient Instructions (Addendum)
Botulinum Toxin Cosmetic Injection Botulinum toxin injection is a shot in the skin that is given to soften lines and wrinkles in the face. The medicine works by weakening the muscles in the face, which reduces the wrinkles when you move your face. Tell a doctor about: Any allergies you have, especially if you are allergic to eggs or milk. All the medicines you take. This includes: Antibiotics. Vitamins. Herbs. Eye drops. Creams. Over-the-counter medicines. Any bleeding problems you have. Any surgeries you have had. Any medical conditions you have. Whether you are pregnant or may be pregnant. Whether you are breastfeeding. What are the risks? Your doctor will talk with you about risks. These may include: Pain in the jaw. Pain in the neck. Allergic reaction to the shot. Infection. Drooping eyebrow or eyelid. Not being able to close one or both eyes. Double vision. Blurry vision. Changes in voice or speech. Trouble swallowing. What happens before the procedure? Ask your doctor about changing or stopping: Your normal medicines. Vitamins, herbs, and supplements. Over-the-counter medicines. Do not take aspirin or ibuprofen unless you are told to. Do not drink alcohol if your doctor tells you not to drink. What happens during the procedure?  The area to be treated may be: Chilled with ice. Numbed with medicine. Your doctor will give you some shots of the toxin. How many shots you get depends on: Your condition. Which area is being treated. The procedure may vary among doctors and hospitals. What can I expect after the procedure? After these shots, it is common to have: Pain, soreness, swelling, itching, or redness on your face. Small bruises from the needle. Bumps or marks from the needle. Loss of feeling in areas where the needle entered the body. Headache. This is rare. It may take up to 14 days for the treatment to work. It may work sooner for some people. Results will  last for about 3-4 months. Follow-up treatment will make this last longer. Follow these instructions at home: Managing pain and discomfort  Take over-the-counter and prescription medicines only as told by your doctor. If told, put ice on the affected area: Put ice in a plastic bag. Place a towel between your skin and the bag. Leave the ice on for 20 minutes, 2-3 times per day. If your skin turns bright red, take off the ice right away to prevent skin damage. The risk of damage is higher if you cannot feel pain, heat, or cold. Activity Do not do activities that take a lot of effort. Avoid these activities for 12 hours after the procedure or for as long as told by your doctor. This includes: Not lifting heavy items. Not working out. Not doing activities that make your heart beat faster. General instructions Do not lie down for 4 hours after treatment or as told by your doctor. Do not rub the area where you got the shot. This can spread the toxin. Depending on where the shot was given: Your doctor may ask you not to move your muscles in that area. Your doctor may tell you to frown or squint regularly. You may need to do these exercises every 15 minutes for 1 hour after the shots. Follow what the doctor tells you. Do not get laser treatments, facials, or facial massages for 1-2 weeks after the procedure or for as long as told by your doctor. Contact a doctor if: You have a headache that gets worse. You have a fever. Your eyelids start to get droopy or swollen. You have trouble  speaking. You have trouble swallowing. Get help right away if: You have signs of an allergic reaction. These include: An itchy rash or welts. Wheezing. You feel short of breath or have trouble breathing. Dizziness. These symptoms may be an emergency. Get help right away. Call 911. Do not wait to see if the symptoms will go away. Do not drive yourself to the hospital. This information is not intended to replace  advice given to you by your health care provider. Make sure you discuss any questions you have with your health care provider. Document Revised: 08/25/2022 Document Reviewed: 08/25/2022 Elsevier Patient Education  2024 [employer].

## 2023-10-02 NOTE — Progress Notes (Signed)
Pt here for Botox Injection.  S: Pt in office today for Botox injections. Her Botox has been working well for migraine prevention in addition to nurtec every other day.  She notes the nurtec seems to only last about 36 hours, leaving her room for breakthrough headache.  Also she is having difficulty with TMJ.  She is using about 1/4 of flexeril tab around 7:30 in the evening.     O: BP 116/78   Pulse 79   Wt 129 lb (58.5 kg)   BMI 23.59 kg/m    Botox Procedure Note Vial of Botox was : refrigerated in office after delivered from pharmacy with her name on it.    Botox Dosing by [employer] for Chronic Migraine   Injection Sites for Migraines  Botox 155 units was injected using the dosage in the table above in the pattern shown above. 45 units wasted  A: Migraine  Muscle spasm   P: Botox 155 units injected today.  Continue Nurtec every other day.  Zavzpret NS sample provided.  Pt may trial for acute treatment of breakthrough headache.  If it is helpful, she will request rx.   Pt to try increasing flexeril to 1/2 tab earlier in the evening to help with TMJ issues.  She is also going to reach out to oral surgery.   RTC 3 Months.

## 2023-10-16 ENCOUNTER — Other Ambulatory Visit (HOSPITAL_COMMUNITY)
Admission: RE | Admit: 2023-10-16 | Discharge: 2023-10-16 | Disposition: A | Discharge status: Home / Self Care | Payer: No Typology Code available for payment source | Source: Ambulatory Visit | Attending: Oncology | Admitting: Oncology

## 2023-10-16 DIAGNOSIS — Z006 Encounter for examination for normal comparison and control in clinical research program: Secondary | ICD-10-CM | POA: Insufficient documentation

## 2023-10-27 LAB — HELIX MOLECULAR SCREEN: Genetic Analysis Overall Interpretation: NEGATIVE

## 2023-11-17 ENCOUNTER — Telehealth: Payer: Self-pay | Admitting: *Deleted

## 2023-11-17 NOTE — Telephone Encounter (Signed)
Returned call from 11/16/2023 at 4:40 PM. Left patient a message to call and schedule [place] with Dr. Penne Lash.

## 2023-11-22 ENCOUNTER — Encounter (HOSPITAL_BASED_OUTPATIENT_CLINIC_OR_DEPARTMENT_OTHER): Payer: Self-pay | Admitting: Internal Medicine

## 2023-11-22 DIAGNOSIS — R748 Abnormal levels of other serum enzymes: Secondary | ICD-10-CM

## 2023-11-22 DIAGNOSIS — E7849 Other hyperlipidemia: Secondary | ICD-10-CM

## 2023-12-02 ENCOUNTER — Encounter: Payer: Self-pay | Admitting: Obstetrics & Gynecology

## 2023-12-04 LAB — NMR, LIPOPROFILE
Cholesterol, Total: 173 mg/dL (ref 100–199)
HDL Particle Number: 37.7 umol/L (ref >=30.5)
HDL-C: 80 mg/dL (ref >39)
LDL Particle Number: 885 nmol/L (ref <1000)
LDL Size: 21.1 nmol (ref >20.5)
LDL-C (NIH Calc): 80 mg/dL (ref 0–99)
LP-IR Score: 25 (ref <=45)
Small LDL Particle Number: <90 nmol/L (ref <=527)
Triglycerides: 69 mg/dL (ref 0–149)

## 2023-12-04 LAB — COMPREHENSIVE METABOLIC PANEL
ALT: 75 [IU]/L — ABNORMAL HIGH (ref 0–32)
AST: 46 [IU]/L — ABNORMAL HIGH (ref 0–40)
Albumin: 4.4 g/dL (ref 3.8–4.9)
Alkaline Phosphatase: 116 [IU]/L (ref 44–121)
BUN/Creatinine Ratio: 13 (ref 9–23)
BUN: 10 mg/dL (ref 6–24)
Bilirubin Total: 0.8 mg/dL (ref 0.0–1.2)
CO2: 28 mmol/L (ref 20–29)
Calcium: 9.7 mg/dL (ref 8.7–10.2)
Chloride: 104 mmol/L (ref 96–106)
Creatinine, Ser: 0.75 mg/dL (ref 0.57–1.00)
Globulin, Total: 2.7 g/dL (ref 1.5–4.5)
Glucose: 87 mg/dL (ref 70–99)
Potassium: 4.7 mmol/L (ref 3.5–5.2)
Sodium: 140 mmol/L (ref 134–144)
Total Protein: 7.1 g/dL (ref 6.0–8.5)
eGFR: 95 mL/min/{1.73_m2} (ref >59)

## 2023-12-07 ENCOUNTER — Ambulatory Visit: Payer: No Typology Code available for payment source | Admitting: Obstetrics & Gynecology

## 2023-12-07 ENCOUNTER — Encounter: Payer: Self-pay | Admitting: Obstetrics & Gynecology

## 2023-12-07 ENCOUNTER — Other Ambulatory Visit (HOSPITAL_COMMUNITY)
Admission: RE | Admit: 2023-12-07 | Discharge: 2023-12-07 | Disposition: A | Discharge status: Home / Self Care | Payer: No Typology Code available for payment source | Source: Ambulatory Visit | Attending: Obstetrics & Gynecology | Admitting: Obstetrics & Gynecology

## 2023-12-07 VITALS — BP 113/74 | HR 83 | Resp 16 | Ht 64.0 in | Wt 132.0 lb

## 2023-12-07 DIAGNOSIS — N952 Postmenopausal atrophic vaginitis: Secondary | ICD-10-CM

## 2023-12-07 DIAGNOSIS — Z01419 Encounter for gynecological examination (general) (routine) without abnormal findings: Secondary | ICD-10-CM | POA: Insufficient documentation

## 2023-12-07 MED ORDER — ESTRADIOL 10 MCG VA TABS
1.0000 | ORAL_TABLET | Freq: Every day | VAGINAL | 12 refills | Status: DC
Start: 2023-12-07 — End: 2024-10-10

## 2023-12-07 NOTE — Progress Notes (Signed)
   Subjective:     Shelly Wilkinson is a 52 y.o. female here for a routine exam.  Current complaints: in menopause; hot flashes some, very tired,  pain with intercourse; fecal/flatulent incontinence,    Gynecologic History No LMP recorded. Patient has had an ablation. Contraception: post menopausal status Last Mammogram: 10/24 Last Pap Smear:  5/19 Last Colon Screening;  Sched 1/25 Seat Belts:   yes Sun Screen:   yes Dental Check Up:  yes Brush & Floss:  yes   Obstetric History OB History  Gravida Para Term Preterm AB Living  3 2 2   1 2   SAB IAB Ectopic Multiple Live Births  1            # Outcome Date GA Lbr Len/2nd Weight Sex Type Anes PTL Lv  3 SAB           2 Term           1 Term              The following portions of the patient's history were reviewed and updated as appropriate: allergies, current medications, past family history, past medical history, past social history, past surgical history, and problem list.  Review of Systems Pertinent items noted in HPI and remainder of comprehensive ROS otherwise negative.    Objective:     Vitals:   12/07/23 1045  BP: 113/74  Pulse: 83  Resp: 16  Weight: 132 lb (59.9 kg)  Height: 5\' 4"  (1.626 m)   Vitals:  WNL General appearance: alert, cooperative and no distress  HEENT: Normocephalic, without obvious abnormality, atraumatic Eyes: negative Throat: lips, mucosa, and tongue normal; teeth and gums normal  Respiratory: Clear to auscultation bilaterally  CV: Regular rate and rhythm  Breasts:  Normal appearance, no masses or tenderness, no nipple retraction or dimpling  GI: Soft, non-tender; bowel sounds normal; no masses,  no organomegaly  GU: External Genitalia:  Tanner V, no lesion Urethra:  No prolapse   Vagina: Very pale pink, atrophic, stable mass at apex of vagina (biopsied   Cervix: No CMT, no lesion  Uterus:  Normal size and contour, non tender  Adnexa: Normal, no masses, non tender   Musculoskeletal: No edema, redness or tenderness in the calves or thighs  Skin: No lesions or rash  Lymphatic: Axillary adenopathy: none     Psychiatric: Normal mood and behavior        Assessment:    Healthy female exam.   Pelvic floor instability--increased vaginal tone, fecal/flatulence incontinence Atrophic vaginitis Extreme fatigue  Plan:    1.  Pap with cotesting 2.  Yearly mammogram 3.  Colon cancer screening per GI due to infalmmatory bowel disease 4.  Dexa due to menopuase and IBD.  5.  Maximize Calcium and Vit D / weight brain exercise.  6.  Stable vaginal mass (bx 2016 Soft tissue mass, biopsy, vaginal - POLYPOID BENIGN SQUAMOUS MUCOSA WITH VASCULAR ECTASIA. - NEGATIVE FOR DYSPLASIA OR MALIGNANCY.) 7.  Fatigue:  recommend sleep study; We also discuss OURA ring to evaluate sleep--leaning more towards this option.  TSH nml at [facility] p er pt on 11/27' cbc nml 09/11/23 at [place] 8.  PT for increased vaginal tone; fecal incont, painful intercourse.  9.  Atrophic vaginitis:  Vagifem and vaginal moisturizers; lubricant at intercourse

## 2023-12-08 ENCOUNTER — Encounter: Payer: Self-pay | Admitting: Obstetrics & Gynecology

## 2023-12-09 ENCOUNTER — Other Ambulatory Visit: Payer: Self-pay | Admitting: *Deleted

## 2023-12-09 ENCOUNTER — Other Ambulatory Visit: Payer: Self-pay | Admitting: Physician Assistant

## 2023-12-09 DIAGNOSIS — R159 Full incontinence of feces: Secondary | ICD-10-CM

## 2023-12-09 LAB — CYTOLOGY - PAP
Comment: NEGATIVE
Diagnosis: UNDETERMINED — AB
High risk HPV: NEGATIVE

## 2023-12-10 ENCOUNTER — Encounter: Payer: Self-pay | Admitting: Internal Medicine

## 2023-12-13 ENCOUNTER — Other Ambulatory Visit: Payer: Self-pay | Admitting: Internal Medicine

## 2023-12-13 DIAGNOSIS — K51519 Left sided colitis with unspecified complications: Secondary | ICD-10-CM

## 2023-12-13 DIAGNOSIS — Z7960 Long term (current) use of unspecified immunomodulators and immunosuppressants: Secondary | ICD-10-CM

## 2023-12-13 DIAGNOSIS — Z111 Encounter for screening for respiratory tuberculosis: Secondary | ICD-10-CM

## 2023-12-13 DIAGNOSIS — D500 Iron deficiency anemia secondary to blood loss (chronic): Secondary | ICD-10-CM

## 2023-12-15 ENCOUNTER — Telehealth (INDEPENDENT_AMBULATORY_CARE_PROVIDER_SITE_OTHER): Payer: No Typology Code available for payment source | Admitting: Internal Medicine

## 2023-12-15 ENCOUNTER — Encounter (HOSPITAL_BASED_OUTPATIENT_CLINIC_OR_DEPARTMENT_OTHER): Payer: Self-pay | Admitting: Internal Medicine

## 2023-12-15 VITALS — Ht 62.25 in | Wt 126.0 lb

## 2023-12-15 DIAGNOSIS — R748 Abnormal levels of other serum enzymes: Secondary | ICD-10-CM | POA: Diagnosis not present

## 2023-12-15 DIAGNOSIS — E7849 Other hyperlipidemia: Secondary | ICD-10-CM

## 2023-12-15 DIAGNOSIS — E785 Hyperlipidemia, unspecified: Secondary | ICD-10-CM

## 2023-12-15 MED ORDER — ROSUVASTATIN CALCIUM 20 MG PO TABS: 20.0000 mg | ORAL_TABLET | Freq: Every day | ORAL | 3 refills | Status: AC

## 2023-12-15 NOTE — Patient Instructions (Signed)
Medication Instructions:  INCREASE rosuvastatin to 20mg  daily   *If you need a refill on your cardiac medications before your next appointment, please call your pharmacy*   Lab Work: Non-Fasting liver enzyme test after ONE MONTH on increased crestor dose  Fasting lab work in 1 year -- before next appointment  If you have labs (blood work) drawn today and your tests are completely normal, you will receive your results only by: MyChart Message (if you have MyChart) OR A paper copy in the mail If you have any lab test that is abnormal or we need to change your treatment, we will call you to review the results.   Follow-Up: At [facility], you and your health needs are our priority.  As part of our continuing mission to provide you with exceptional heart care, we have created designated Provider Care Teams.  These Care Teams include your primary Cardiologist (physician) and Advanced Practice Providers (APPs -  Physician Assistants and Nurse Practitioners) who all work together to provide you with the care you need, when you need it.  We recommend signing up for the patient portal called "MyChart".  Sign up information is provided on this After Visit Summary.  MyChart is used to connect with patients for Virtual Visits (Telemedicine).  Patients are able to view lab/test results, encounter notes, upcoming appointments, etc.  Non-urgent messages can be sent to your provider as well.   To learn more about what you can do with MyChart, go to [email or web address].    Your next appointment:    12 months with Dr. Rennis Golden

## 2023-12-15 NOTE — Progress Notes (Unsigned)
Virtual Visit via Video Note   Because of Naviana Pandit Wilkinson's co-morbid illnesses, she is at least at moderate risk for complications without adequate follow up.  This format is felt to be most appropriate for this patient at this time.  All issues noted in this document were discussed and addressed.  A limited physical exam was performed with this format.  Please refer to the patient's chart for her consent to telehealth for [facility].      Date:  12/16/2023   ID:  Shelly Wilkinson, DOB 1971-09-21, MRN [id number] The patient was identified using 2 identifiers.  Evaluation Performed:  Follow-Up Visit  Patient Location:  [address] [place] [address]  Provider location:   [address] [place], [place] [address]  PCP:  Thana Ates, MD  Cardiologist:  None Electrophysiologist:  None   Chief Complaint:  Follow-up dyslipidemia  History of Present Illness:    [name] is a 52 y.o. female who presents via audio/video conferencing for a telehealth visit today.  Laporchia Njoroge Twitty is a 52 y.o. female who is being seen today for the evaluation of familial hyperlipidemia at the request of Dr. Antoine Poche.  [name] is a pleasant female who is establishing care for management of dyslipidemia.  She has known about high cholesterol for some time however more recently her lipids have increased significantly.  In July 2023 her total cholesterol was 301, triglycerides of 88, HDL 83 and LDL 204.  While she does have a high HDL cholesterol, her LDL is significantly elevated suggestive of a familial hyperlipidemia.  She notes that there is a strong family history of heart disease including her father who had significant heart disease and high cholesterol is noted in multiple family members.  She has not been on treatment for this.  She also has hypothyroidism and vitamin D deficiency both of which have been treated.  She suffers from Crohn's colitis and  has had hypoglycemia.  She recently had a coronary calcium score which fortunately was 0.  She is here today to discuss options for regarding her dyslipidemia.  She reports a very healthy diet low in saturated fats and tries to avoid triggers for high cholesterol.  She also is physically active and recently has been exercising at the [facility] fitness center.  01/22/2023  [name] returns today for follow-up.  She has had marked improvement in her lipids on rosuvastatin.  Initially she had some possible GI upset with this however that improved a lot and she has subsequently been able to take the medicine.  Her cholesterols come down substantially.  Her LDL particle number is now down to 624.  LDL of 71, HDL 80 and triglycerides 60.  Her LP(a) is negative at 29.5 nmol/L.  Her ALT is mildly elevated at 59 this is up from previous readings.  She did report that she has family history of fatty liver disease in her brother and mother I believe.  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  08/06/2023  [name] is seen today for virtual follow-up.  She seems to be doing very well with regards to her lipids on rosuvastatin.  She did have liver enzyme abnormalities back in April with a small increase demonstrating an  AST of 45 and ALT of 72.  Her lipid profile in July however showed LDL particle number even lower at 599 with an LDL-C of 67 and HDL of 90 and triglycerides of 62.  She had 0 coronary calcium last year.  12/15/2023  [name] seen today for video follow-up.  When I last saw her we had backed off on her rosuvastatin from 20 to 10 mg daily.  This was due to an elevated liver enzymes.  However it seems that her liver enzymes are up and down.  Recent metabolic profile showed AST 46  and ALT 75, up slightly from 37 and 58.  However previously her ALT had been up to 72.  Unfortunately her lipids are worse on a lower dose statin.  LDL particle number is increased from [phone] with an LDL of 80 up from 64.  Prior CV studies:   The following studies were reviewed today:  Chart reviewed, labwork  PMHx:  Past Medical History:  Diagnosis Date   Allergy    Anemia, iron deficiency 08/25/2016   Anxiety    Arthritis    Choroidal nevus    Chronic anal fissure    Complication of anesthesia    headaches/   06-22-2014 surg. had urinary retention   Constipation due to outlet dysfunction    Dyssynergic constipation 10/01/2016   Anorectal mano   Episcleritis-uveitis? 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   Left sided ulcerative colitis (? Crohn's) with complication (HCC) 11/18/2016   Diagnosed fall 2017. Indeterminate probably. Rectal involvement but patchy small ulcers. Initially chewed with Lialda and uceris was added. Changed to Humira May 2018. Prednisone started April 2018. Improved.     07/2022 Yusimry - biosimilar started   Macular degeneration    Migraines    Thrombosed hemorrhoids    Vitamin D deficiency 11/19/2016   Wears glasses     Past Surgical History:  Procedure Laterality Date   ANAL RECTAL MANOMETRY N/A 09/17/2016   Procedure: ANO RECTAL MANOMETRY;  Surgeon: Iva Boop, MD;  Location: [facility];  Service: Endoscopy;  Laterality: N/A;   CHEILECTOMY Right 12/15/2013   Procedure: RIGHT HALLUX CHEILECTOMY;  Surgeon: Toni Arthurs, MD;  Location: [facility];  Service: Orthopedics;  Laterality: Right;   CHEILECTOMY Left 02-12-2006   great toe   COLONOSCOPY  10-02-2010   CYSTO/  HYDRODISTENTION/  INSTILLATION THERAPY  03-19-2010   D &  C HYSTEROSCOPY /  HYDROTHERMAL ENDOMETRIAL ABLATION/  LEEP  05-29-2011   DILATION AND EVACUATION  2002   ESOPHAGOGASTRODUODENOSCOPY (EGD) WITH PROPOFOL N/A 04/17/2022   Procedure: ESOPHAGOGASTRODUODENOSCOPY (EGD) WITH PROPOFOL;  Surgeon: Rachael Fee, MD;  Location: [facility];  Service: Gastroenterology;  Laterality: N/A;   EUS N/A 04/17/2022   Procedure: UPPER ENDOSCOPIC ULTRASOUND (EUS) RADIAL;  Surgeon: Rachael Fee, MD;  Location: [facility];  Service: Gastroenterology;  Laterality: N/A;   EVALUATION UNDER ANESTHESIA WITH ANAL FISTULECTOMY N/A 12/26/2014   Procedure: EXAM UNDER ANESTHESIA ;  Surgeon: Romie Levee, MD;  Location: [facility];  Service: General;  Laterality: N/A;   HEMORRHOID BANDING  02-16-2014   REPAIR LACERATED RADIAL DIGITAL NERVE LEFT RING FINGER  08-25-2005   SPHINCTEROTOMY N/A 12/26/2014   Procedure: CHEMICAL SPHINCTEROTOMY (BOTOX);  Surgeon: Romie Levee, MD;  Location: [facility];  Service: General;  Laterality: N/A;   TRANSANAL HEMORRHOIDAL DEARTERIALIZATION  06-22-2014    FAMHx:  Family History  Problem Relation Age of Onset   Diabetes Father    Heart disease Father    Heart attack Father    Hypertension Father    Colitis Father    Stroke Father    Rheum arthritis Maternal Grandmother    Heart attack Paternal Grandmother    Lung cancer Paternal Grandfather    Colon cancer Maternal Aunt 27   Stomach cancer Neg Hx    Esophageal cancer Neg Hx    Pancreatic cancer Neg Hx     SOCHx:   reports that she has never smoked. She has never used smokeless tobacco. She reports current alcohol use. She reports that she does not use drugs.  ALLERGIES:  Allergies  Allergen Reactions   Sulfa Antibiotics Hives, Nausea And Vomiting and Other (See Comments)    And fever    MEDS:  Current Meds  Medication Sig   Adalimumab-aqvh (YUSIMRY) 40 MG/0.8ML SOPN Inject 40 mg into the skin every 14 (fourteen) days.   ALOE VERA  PO Take 1-2 capsules by mouth See admin instructions. Take 1 capsule in the AM and 2 capsules in the PM.   BOTOX 200 units injection Inject 200 units intramuscularly every 3 months **must be administered by a healthcare professional**   cholecalciferol (VITAMIN D) 1000 units tablet Take 1,000 Units by mouth daily.   [employer]S SUNSCREEN SPF 30 EX apply topically to face and body daily for 30   cyclobenzaprine (FLEXERIL) 10 MG tablet Take 1 tablet (10 mg total) by mouth every 8 (eight) hours as needed for muscle spasms. (Patient taking differently: Take 2.5 mg by mouth every 8 (eight) hours as needed (migraine headache).)   cycloSPORINE (RESTASIS) 0.05 % ophthalmic emulsion Place 1 drop into both eyes 2 (two) times daily.   DULoxetine (CYMBALTA) 60 MG capsule TAKE 1 CAPSULE BY MOUTH DAILY   Estradiol 10 MCG TABS vaginal tablet Place 1 tablet (10 mcg total) vaginally daily. Place one tablet vaginally daily for two weeks and then switch to twice weekly   ibuprofen (ADVIL,MOTRIN) 200 MG tablet Take 600 mg by mouth every 6 (six) hours as needed for mild pain. Patient takes as needed   levothyroxine (SYNTHROID) 75 MCG tablet Take 75 mcg by mouth daily before breakfast.   methylphenidate 27 MG PO TB24 Take 1 tablet by mouth daily. Taking  36 mg   Multiple Vitamins-Minerals (PRESERVISION AREDS PO) Take 1 tablet by mouth in the morning and at bedtime.   Rimegepant Sulfate (NURTEC) 75 MG TBDP Take 1 tablet (75 mg total) by mouth every other day.   tazarotene (TAZORAC) 0.05 % cream Apply 1 application. topically at bedtime.   [DISCONTINUED] rosuvastatin (CRESTOR) 10 MG tablet Take 1 tablet (10 mg total) by mouth daily.     ROS: Pertinent items noted in HPI and remainder of comprehensive ROS otherwise negative.  Labs/Other Tests and Data Reviewed:    Recent Labs: 12/03/2023: ALT 75; BUN 10; Creatinine, Ser 0.75; Potassium 4.7; Sodium 140   Recent Lipid Panel No results found for: "CHOL", "TRIG", "HDL",  "CHOLHDL", "LDLCALC", "LDLDIRECT"  Wt Readings from Last 3 Encounters:  12/15/23 126 lb (57.2 kg)  12/07/23 132 lb (59.9 kg)  10/02/23 129 lb (58.5 kg)     Exam:    Vital Signs:  Ht 5' 2.25" (1.581 m)   Wt 126 lb (57.2 kg)  BMI 22.86 kg/m    General appearance: alert and no distress Lungs: No visual respiratory difficulty Abdomen: Normal weight Extremities: extremities normal, atraumatic, no cyanosis or edema Neurologic: Grossly normal  ASSESSMENT & PLAN:    Probable familial hyperlipidemia, LDL greater than 190 -genetic testing confirmed abnormal APO B variant as well as PEMT variation, which increases the risk of fatty liver disease. Multiple family members with high cholesterol in father with coronary artery disease Hypothyroidism Vitamin D deficiency Crohn's disease 0 CAC score (07/2022) Elevated liver enzymes Negative LP(a)-29.5 nmol/L  Ms. Biehler has had a relative increase in her cholesterol not surprisingly with a decreased dose of her statin.  Her liver enzymes though have been up and down and not necessarily improved with decreasing the dose of her medication.  She has not had liver enzyme elevations over 3 times upper limit of normal and therefore I would advise going back up on her statin dose.  Will continue to monitor liver enzymes closely as well as her lipids to continue to try to target her LDL to less than 70.  Patient Risk:   After full review of this patients clinical status, I feel that they are at least moderate risk at this time.  Time:   Today, I have spent 15 minutes with the patient with telehealth technology discussing dyslipidemia.     Medication Adjustments/Labs and Tests Ordered: Current medicines are reviewed at length with the patient today.  Concerns regarding medicines are outlined above.   Tests Ordered: Orders Placed This Encounter  Procedures   Hepatic function panel   NMR, lipoprofile    Medication Changes: Meds ordered this  encounter  Medications   rosuvastatin (CRESTOR) 20 MG tablet    Sig: Take 1 tablet (20 mg total) by mouth daily.    Dispense:  90 tablet    Refill:  3    Disposition:  in 4 month(s)  Chrystie Nose, MD, [facility], FACP  [place]  [facility]  Medical Director of the [facility]  [facility] Diplomate of the American Board of Clinical Lipidology Attending Cardiologist  Direct Dial: [phone]  Fax: [phone]  Website:  [email or web address]  Chrystie Nose, MD  12/16/2023 8:50 AM

## 2023-12-16 ENCOUNTER — Ambulatory Visit: Payer: No Typology Code available for payment source

## 2023-12-16 DIAGNOSIS — Z01419 Encounter for gynecological examination (general) (routine) without abnormal findings: Secondary | ICD-10-CM

## 2023-12-17 ENCOUNTER — Encounter: Payer: Self-pay | Admitting: Obstetrics & Gynecology

## 2023-12-17 ENCOUNTER — Other Ambulatory Visit: Payer: Self-pay | Admitting: *Deleted

## 2023-12-17 DIAGNOSIS — Z780 Asymptomatic menopausal state: Secondary | ICD-10-CM | POA: Insufficient documentation

## 2023-12-17 MED ORDER — NURTEC 75 MG PO TBDP: 75.0000 mg | ORAL_TABLET | ORAL | 6 refills | Status: DC

## 2023-12-18 ENCOUNTER — Ambulatory Visit (AMBULATORY_SURGERY_CENTER): Payer: No Typology Code available for payment source

## 2023-12-18 VITALS — Ht 62.0 in | Wt 128.0 lb

## 2023-12-18 DIAGNOSIS — K5090 Crohn's disease, unspecified, without complications: Secondary | ICD-10-CM

## 2023-12-18 NOTE — Progress Notes (Signed)
No egg or soy allergy known to patient  No issues known to pt with past sedation with any surgeries or procedures Patient denies ever being told they had issues or difficulty with intubation  No FH of Malignant Hyperthermia Pt is not on diet pills Pt is not on  home 02  Pt is not on blood thinners  Pt with crohn's hx intermit diarrhea/constipation per pt controlled at this point  No A fib or A flutter Have any cardiac testing pending--  no  LOA: independent  Prep: spilt dose miralax   Patient's chart reviewed by Cathlyn Parsons CNRA prior to previsit and patient appropriate for the [facility].  Previsit completed and red dot placed by patient's name on their procedure day (on provider's schedule).     PV competed with patient. Prep instructions sent via mychart and home address.

## 2023-12-31 ENCOUNTER — Other Ambulatory Visit (INDEPENDENT_AMBULATORY_CARE_PROVIDER_SITE_OTHER): Payer: No Typology Code available for payment source

## 2023-12-31 DIAGNOSIS — Z7960 Long term (current) use of unspecified immunomodulators and immunosuppressants: Secondary | ICD-10-CM

## 2023-12-31 DIAGNOSIS — K51519 Left sided colitis with unspecified complications: Secondary | ICD-10-CM

## 2023-12-31 DIAGNOSIS — D500 Iron deficiency anemia secondary to blood loss (chronic): Secondary | ICD-10-CM | POA: Diagnosis not present

## 2023-12-31 DIAGNOSIS — Z111 Encounter for screening for respiratory tuberculosis: Secondary | ICD-10-CM

## 2023-12-31 LAB — CBC WITH DIFFERENTIAL/PLATELET
Basophils Absolute: 0 10*3/uL (ref 0.0–0.1)
Basophils Relative: 0.6 % (ref 0.0–3.0)
Eosinophils Absolute: 0.2 10*3/uL (ref 0.0–0.7)
Eosinophils Relative: 2.6 % (ref 0.0–5.0)
HCT: 42.6 % (ref 36.0–46.0)
Hemoglobin: 14.1 g/dL (ref 12.0–15.0)
Lymphocytes Relative: 49.6 % — ABNORMAL HIGH (ref 12.0–46.0)
Lymphs Abs: 3.1 10*3/uL (ref 0.7–4.0)
MCHC: 33 g/dL (ref 30.0–36.0)
MCV: 91.3 fL (ref 78.0–100.0)
Monocytes Absolute: 0.7 10*3/uL (ref 0.1–1.0)
Monocytes Relative: 11.5 % (ref 3.0–12.0)
Neutro Abs: 2.2 10*3/uL (ref 1.4–7.7)
Neutrophils Relative %: 35.7 % — ABNORMAL LOW (ref 43.0–77.0)
Platelets: 233 10*3/uL (ref 150.0–400.0)
RBC: 4.67 Mil/uL (ref 3.87–5.11)
RDW: 12.7 % (ref 11.5–15.5)
WBC: 6.2 10*3/uL (ref 4.0–10.5)

## 2023-12-31 LAB — FERRITIN: Ferritin: 35.3 ng/mL (ref 10.0–291.0)

## 2024-01-01 ENCOUNTER — Other Ambulatory Visit: Payer: Self-pay | Admitting: Internal Medicine

## 2024-01-02 LAB — QUANTIFERON-TB GOLD PLUS
Mitogen-NIL: 7.96 [IU]/mL
NIL: 0.03 [IU]/mL
QuantiFERON-TB Gold Plus: NEGATIVE
TB1-NIL: 0 [IU]/mL
TB2-NIL: 0 [IU]/mL

## 2024-01-04 NOTE — Progress Notes (Signed)
 [facility] History and Physical   Primary Care Physician:  Dwight Trula SQUIBB, MD   Reason for Procedure:   Left-sided UC vs Crohn's  Plan:    colonoscopy     HPI: Shelly Wilkinson is a 53 y.o. female here to reassess disease status of IBD - left-sided UC vs Crohn's on adalimumab  biosimilar. She was diagnosed in 2017. Last colonoscopy 2019. Colitis in right and left colon Hemorrhoids are swollen today.   Past Medical History:  Diagnosis Date   Allergy    Anemia, iron deficiency 08/25/2016   Anxiety    Arthritis    Choroidal nevus    Chronic anal fissure    Complication of anesthesia    headaches/   06-22-2014 surg. had urinary retention   Constipation due to outlet dysfunction    Dyssynergic constipation 10/01/2016   Anorectal mano   Episcleritis-uveitis? 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   Left sided ulcerative colitis (? Crohn's) with complication (HCC) 11/18/2016   Diagnosed fall 2017. Indeterminate probably. Rectal involvement but patchy small ulcers. Initially chewed with Lialda  and uceris  was added. Changed to Humira  May 2018. Prednisone  started April 2018. Improved.     07/2022 Yusimry  - biosimilar started   Macular degeneration    Migraines    Thrombosed hemorrhoids    Vitamin D  deficiency 11/19/2016   Wears glasses     Past Surgical History:  Procedure Laterality Date   ANAL RECTAL MANOMETRY N/A 09/17/2016   Procedure: ANO RECTAL MANOMETRY;  Surgeon: Lupita FORBES Commander, MD;  Location: [facility];  Service: Endoscopy;  Laterality: N/A;   CHEILECTOMY Right 12/15/2013   Procedure: RIGHT HALLUX CHEILECTOMY;  Surgeon: Norleen Armor, MD;  Location: [facility];  Service: Orthopedics;  Laterality: Right;   CHEILECTOMY Left 02-12-2006    great toe   COLONOSCOPY  10-02-2010   CYSTO/  HYDRODISTENTION/  INSTILLATION THERAPY  03-19-2010   D & C HYSTEROSCOPY /  HYDROTHERMAL ENDOMETRIAL ABLATION/  LEEP  05-29-2011   DILATION AND EVACUATION  2002   ESOPHAGOGASTRODUODENOSCOPY (EGD) WITH PROPOFOL  N/A 04/17/2022   Procedure: ESOPHAGOGASTRODUODENOSCOPY (EGD) WITH PROPOFOL ;  Surgeon: Teressa Toribio SQUIBB, MD;  Location: [facility];  Service: Gastroenterology;  Laterality: N/A;   EUS N/A 04/17/2022   Procedure: UPPER ENDOSCOPIC ULTRASOUND (EUS) RADIAL;  Surgeon: Teressa Toribio SQUIBB, MD;  Location: [facility];  Service: Gastroenterology;  Laterality: N/A;   EVALUATION UNDER ANESTHESIA WITH ANAL FISTULECTOMY N/A 12/26/2014   Procedure: EXAM UNDER ANESTHESIA ;  Surgeon: Bernarda Ned, MD;  Location: [facility];  Service: General;  Laterality: N/A;   HEMORRHOID BANDING  02-16-2014   REPAIR LACERATED RADIAL DIGITAL NERVE LEFT RING FINGER  08-25-2005   SPHINCTEROTOMY N/A 12/26/2014   Procedure: CHEMICAL SPHINCTEROTOMY (BOTOX );  Surgeon: Bernarda Ned, MD;  Location: [facility];  Service: General;  Laterality: N/A;   TRANSANAL HEMORRHOIDAL DEARTERIALIZATION  06-22-2014    Prior to Admission medications   Medication Sig Start Date End Date Taking? Authorizing Provider  Adalimumab -aqvh (YUSIMRY ) 40 MG/0.8ML SOPN Inject 40 mg into the skin every 14 (fourteen) days. 04/29/23   Commander Lupita FORBES, MD  ALOE VERA PO Take 1-2 capsules by mouth See admin instructions. Take 1 capsule in the AM and 2 capsules in the PM.  [provider]  BOTOX  200 units injection Inject 200 units intramuscularly every 3 months **must be administered by a healthcare professional** 12/17/23   Nance Gaskins, Darice BRAVO, PA-C  cholecalciferol (VITAMIN D ) 1000 units tablet Take 1,000 Units by mouth daily.    [provider]  CVS SUNSCREEN SPF 30 EX apply topically to face and body daily for 30 04/30/22   [provider]   cyclobenzaprine  (FLEXERIL ) 10 MG tablet Take 1 tablet (10 mg total) by mouth every 8 (eight) hours as needed for muscle spasms. Patient taking differently: Take 2.5 mg by mouth every 8 (eight) hours as needed (migraine headache). 08/16/21   Nance Gaskins, Darice BRAVO, PA-C  cycloSPORINE (RESTASIS) 0.05 % ophthalmic emulsion Place 1 drop into both eyes 2 (two) times daily.    [provider]  DULoxetine  (CYMBALTA ) 60 MG capsule TAKE 1 CAPSULE BY MOUTH DAILY 11/03/22   Avram Lupita BRAVO, MD  Estradiol  10 MCG TABS vaginal tablet Place 1 tablet (10 mcg total) vaginally daily. Place one tablet vaginally daily for two weeks and then switch to twice weekly 12/07/23   Leggett, Kelly H, MD  ibuprofen (ADVIL,MOTRIN) 200 MG tablet Take 600 mg by mouth every 6 (six) hours as needed for mild pain. Patient takes as needed    [provider]  levothyroxine (SYNTHROID) 75 MCG tablet Take 75 mcg by mouth daily before breakfast.    [provider]  methylphenidate 27 MG PO TB24 Take 1 tablet by mouth daily. Taking  36 mg    [provider]  Multiple Vitamins-Minerals (PRESERVISION AREDS PO) Take 1 tablet by mouth in the morning and at bedtime.    [provider]  Rimegepant Sulfate (NURTEC) 75 MG TBDP Take 1 tablet (75 mg total) by mouth every other day. 12/17/23   Nance Gaskins, Darice BRAVO, PA-C  rosuvastatin  (CRESTOR ) 20 MG tablet Take 1 tablet (20 mg total) by mouth daily. 12/15/23 12/09/24  Hilty, Vinie BROCKS, MD  tazarotene (TAZORAC) 0.05 % cream Apply 1 application. topically at bedtime.    [provider]    Current Outpatient Medications  Medication Sig Dispense Refill   ALOE VERA PO Take 1-2 capsules by mouth See admin instructions. Take 1 capsule in the AM and 2 capsules in the PM.     cholecalciferol (VITAMIN D ) 1000 units tablet Take 1,000 Units by mouth daily.     cycloSPORINE (RESTASIS) 0.05 % ophthalmic emulsion Place 1 drop into both eyes 2 (two) times daily.      DULoxetine  (CYMBALTA ) 60 MG capsule TAKE 1 CAPSULE BY MOUTH DAILY 90 capsule 3   Estradiol  10 MCG TABS vaginal tablet Place 1 tablet (10 mcg total) vaginally daily. Place one tablet vaginally daily for two weeks and then switch to twice weekly 30 tablet 12   levothyroxine (SYNTHROID) 75 MCG tablet Take 75 mcg by mouth daily before breakfast.     methylphenidate 27 MG PO TB24 Take 1 tablet by mouth daily. Taking  36 mg     Multiple Vitamins-Minerals (PRESERVISION AREDS PO) Take 1 tablet by mouth in the morning and at bedtime.     Rimegepant Sulfate (NURTEC) 75 MG TBDP Take 1 tablet (75 mg total) by mouth every other day. 16 tablet 6   rosuvastatin  (CRESTOR ) 20 MG tablet Take 1 tablet (20 mg total) by mouth daily. 90 tablet 3   tazarotene (TAZORAC) 0.05 % cream Apply 1 application. topically at bedtime.     Adalimumab -aqvh (YUSIMRY ) 40 MG/0.8ML SOPN  Inject 40 mg into the skin every 14 (fourteen) days. 1.6 mL 5   BOTOX  200 units injection Inject 200 units intramuscularly every 3 months **must be administered by a healthcare professional** 1 each 2   CVS SUNSCREEN SPF 30 EX apply topically to face and body daily for 30     cyclobenzaprine  (FLEXERIL ) 10 MG tablet Take 1 tablet (10 mg total) by mouth every 8 (eight) hours as needed for muscle spasms. (Patient taking differently: Take 2.5 mg by mouth every 8 (eight) hours as needed (migraine headache).) 40 tablet 1   ibuprofen (ADVIL,MOTRIN) 200 MG tablet Take 600 mg by mouth every 6 (six) hours as needed for mild pain. Patient takes as needed     Current Facility-Administered Medications  Medication Dose Route Frequency Provider Last Rate Last Admin   0.9 %  sodium chloride  infusion  500 mL Intravenous Once Avram Lupita BRAVO, MD        Allergies as of 01/05/2024 - Review Complete 01/05/2024  Allergen Reaction Noted   Sulfa antibiotics Hives, Nausea And Vomiting, and Other (See Comments) 12/15/2014    Family History  Problem Relation Age of Onset    Diabetes Father    Heart disease Father    Heart attack Father    Hypertension Father    Colitis Father    Stroke Father    Colon cancer Maternal Aunt 80   Rheum arthritis Maternal Grandmother    Heart attack Paternal Grandmother    Lung cancer Paternal Grandfather    Stomach cancer Neg Hx    Esophageal cancer Neg Hx    Pancreatic cancer Neg Hx    Rectal cancer Neg Hx     Social History   Socioeconomic History   Marital status: Married    Spouse name: Not on file   Number of children: 2   Years of education: Not on file   Highest education level: Not on file  Occupational History   Occupation: Administrator, Arts: [employer]  Tobacco Use   Smoking status: Never   Smokeless tobacco: Never  Vaping Use   Vaping status: Never Used  Substance and Sexual Activity   Alcohol use: Yes    Comment: rarely   Drug use: No   Sexual activity: Yes    Partners: Male    Birth control/protection: Surgical    Comment: ablation  Other Topics Concern   Not on file  Social History Narrative   Married, 2 children - sons one [place]-graduated one [employer]    Fourth  grade teacher   No drugs some alcohol never smoker      Social Drivers of Corporate Investment Banker Strain: Not on file  Food Insecurity: Not on file  Transportation Needs: Not on file  Physical Activity: Not on file  Stress: Not on file  Social Connections: Not on file  Intimate Partner Violence: Not on file    Review of Systems:  All other review of systems negative except as mentioned in the HPI.  Physical Exam: Vital signs BP 124/80   Pulse 79   Temp (!) 97.5 F (36.4 C) (Temporal)   Resp 12   Ht 5' 2 (1.575 m)   Wt 128 lb (58.1 kg)   SpO2 100%   BMI 23.41 kg/m   General:   Alert,  Well-developed, well-nourished, pleasant and cooperative in NAD Lungs:  Clear throughout to auscultation.   Heart:  Regular rate and rhythm; no murmurs,  clicks, rubs,  or gallops. Abdomen:  Soft,  nontender and nondistended. Normal bowel sounds.   Neuro/Psych:  Alert and cooperative. Normal mood and affect. A and O x 3   @Kyndal Gloster  CHARLENA Commander, MD, [facility] [phone] (pager) 01/05/2024 8:05 AM@

## 2024-01-05 ENCOUNTER — Encounter: Payer: Self-pay | Admitting: Internal Medicine

## 2024-01-05 ENCOUNTER — Ambulatory Visit: Payer: No Typology Code available for payment source | Admitting: Internal Medicine

## 2024-01-05 VITALS — BP 105/63 | HR 61 | Temp 97.5°F | Resp 12 | Ht 62.0 in | Wt 128.0 lb

## 2024-01-05 DIAGNOSIS — D124 Benign neoplasm of descending colon: Secondary | ICD-10-CM

## 2024-01-05 DIAGNOSIS — Z1211 Encounter for screening for malignant neoplasm of colon: Secondary | ICD-10-CM | POA: Diagnosis present

## 2024-01-05 DIAGNOSIS — K635 Polyp of colon: Secondary | ICD-10-CM | POA: Diagnosis not present

## 2024-01-05 DIAGNOSIS — K6389 Other specified diseases of intestine: Secondary | ICD-10-CM

## 2024-01-05 DIAGNOSIS — K644 Residual hemorrhoidal skin tags: Secondary | ICD-10-CM

## 2024-01-05 DIAGNOSIS — K529 Noninfective gastroenteritis and colitis, unspecified: Secondary | ICD-10-CM | POA: Diagnosis not present

## 2024-01-05 DIAGNOSIS — K642 Third degree hemorrhoids: Secondary | ICD-10-CM

## 2024-01-05 DIAGNOSIS — K5150 Left sided colitis without complications: Secondary | ICD-10-CM

## 2024-01-05 DIAGNOSIS — K5090 Crohn's disease, unspecified, without complications: Secondary | ICD-10-CM

## 2024-01-05 DIAGNOSIS — D125 Benign neoplasm of sigmoid colon: Secondary | ICD-10-CM

## 2024-01-05 DIAGNOSIS — K562 Volvulus: Secondary | ICD-10-CM | POA: Diagnosis not present

## 2024-01-05 MED ORDER — SODIUM CHLORIDE 0.9 % IV SOLN: 500.0000 mL | Freq: Once | INTRAVENOUS | Status: DC

## 2024-01-05 NOTE — Progress Notes (Signed)
 Pt's states no medical or surgical changes since previsit or office visit.

## 2024-01-05 NOTE — Progress Notes (Signed)
 Vss nad trans to pacu

## 2024-01-05 NOTE — Progress Notes (Signed)
 Called to room to assist during endoscopic procedure.  Patient ID and intended procedure confirmed with present staff. Received instructions for my participation in the procedure from the performing physician.

## 2024-01-05 NOTE — Telephone Encounter (Signed)
 I refilled it

## 2024-01-05 NOTE — Op Note (Signed)
 [facility] Patient Name: Shelly Wilkinson Procedure Date: 01/05/2024 8:00 AM MRN: [id number] Endoscopist: Lupita FORBES Commander , MD, [id number] Age: 53 Referring MD:  Date of Birth: 1971/03/04 Gender: Female Account #: [id number] Procedure:                Colonoscopy Indications:              High risk colon cancer surveillance: Ulcerative                            left sided colitis of 8 (or more) years duration,                            Last colonoscopy: 2019 Medicines:                Monitored Anesthesia Care Procedure:                Pre-Anesthesia Assessment:                           - Prior to the procedure, a History and Physical                            was performed, and patient medications and                            allergies were reviewed. The patient's tolerance of                            previous anesthesia was also reviewed. The risks                            and benefits of the procedure and the sedation                            options and risks were discussed with the patient.                            All questions were answered, and informed consent                            was obtained. Prior Anticoagulants: The patient has                            taken no anticoagulant or antiplatelet agents. ASA                            Grade Assessment: II - A patient with mild systemic                            disease. After reviewing the risks and benefits,                            the patient was deemed in satisfactory condition to  undergo the procedure.                           After obtaining informed consent, the colonoscope                            was passed under direct vision. Throughout the                            procedure, the patient's blood pressure, pulse, and                            oxygen saturations were monitored continuously. The                            Olympus Scope [id number] was introduced  through the                            anus and advanced to the the terminal ileum, with                            identification of the appendiceal orifice and IC                            valve. The colonoscopy was somewhat difficult due                            to significant looping. Successful completion of                            the procedure was aided by applying abdominal                            pressure. The patient tolerated the procedure well.                            The quality of the bowel preparation was adequate.                            The bowel preparation used was Miralax via split                            dose instruction. The terminal ileum, ileocecal                            valve, appendiceal orifice, and rectum were                            photographed. Scope In: 8:09:09 AM Scope Out: 8:44:34 AM Scope Withdrawal Time: 0 hours 22 minutes 23 seconds  Total Procedure Duration: 0 hours 35 minutes 25 seconds  Findings:                 Hemorrhoids were found on perianal exam.  A 5 mm polyp was found in the descending colon. The                            polyp was sessile. The polyp was removed with a                            cold snare. Resection and retrieval were complete.                            Verification of patient identification for the                            specimen was done. Estimated blood loss was minimal.                           A 2 mm polyp was found in the sigmoid colon. The                            polyp was sessile. The polyp was removed with a                            cold biopsy forceps. Resection and retrieval were                            complete. Verification of patient identification                            for the specimen was done. Estimated blood loss was                            minimal.                           A patchy area of mildly altered vascular mucosa was                             found in the entire colon. Two biopsies were taken                            every 10 cm with a cold forceps from the entire                            colon for ulcerative colitis surveillance. These                            biopsy specimens from the 4 bottles were sent to                            Pathology. Verification of patient identification                            for the specimen was done. Estimated blood loss was  minimal.                           External and internal hemorrhoids were found. The                            hemorrhoids were Grade III (internal hemorrhoids                            that prolapse but require manual reduction).                           The terminal ileum appeared normal.                           The exam was otherwise without abnormality on                            direct and retroflexion views. Complications:            No immediate complications. Estimated Blood Loss:     Estimated blood loss was minimal. Impression:               - Hemorrhoids found on perianal exam.                           - One 5 mm polyp in the descending colon, removed                            with a cold snare. Resected and retrieved.                           - One 2 mm polyp in the sigmoid colon, removed with                            a cold biopsy forceps. Resected and retrieved.                           - Altered vascular mucosa in the entire examined                            colon. Biopsied. mildy altered vascualarity - nio                            signs of active IBD. Improved vs 2019. Then she had                            left-sided inflammation on inspectin and quiesecnt                            colitis in right and left colon on biopsies.                           - External and internal hemorrhoids.                           -  The examined portion of the ileum was normal.                            - The examination was otherwise normal on direct                            and retroflexion views. Recommendation:           - Patient has a contact number available for                            emergencies. The signs and symptoms of potential                            delayed complications were discussed with the                            patient. Return to normal activities tomorrow.                            Written discharge instructions were provided to the                            patient.                           - Resume previous diet.                           - Continue present medications. Adalimumab  is                            working well.                           - Repeat colonoscopy is recommended for                            surveillance. The colonoscopy date will be                            determined after pathology results from today's                            exam become available for review. Though MiraLax                            worked - retained fluid so try different prep next                            time. Also would use adult scope due to looping and                            consider an abdominal binder as well. Lupita FORBES Commander, MD 01/05/2024 8:58:36 AM This report has been signed electronically.

## 2024-01-05 NOTE — Patient Instructions (Addendum)
 No signs of active inflammation. Biopsies taken.  Two tiny polyps removed.  Prep was good enough.  I appreciate the opportunity to care for you. Shelly CHARLENA Commander, MD, [facility]   Handout provided on polyps.  Resume previous diet and medications.  Follow up colonoscopy based on biopsy results.    YOU HAD AN ENDOSCOPIC PROCEDURE TODAY AT THE [facility]:   Refer to the procedure report that was given to you for any specific questions about what was found during the examination.  If the procedure report does not answer your questions, please call your gastroenterologist to clarify.  If you requested that your care partner not be given the details of your procedure findings, then the procedure report has been included in a sealed envelope for you to review at your convenience later.  YOU SHOULD EXPECT: Some feelings of bloating in the abdomen. Passage of more gas than usual.  Walking can help get rid of the air that was put into your GI tract during the procedure and reduce the bloating. If you had a lower endoscopy (such as a colonoscopy or flexible sigmoidoscopy) you may notice spotting of blood in your stool or on the toilet paper. If you underwent a bowel prep for your procedure, you may not have a normal bowel movement for a few days.  Please Note:  You might notice some irritation and congestion in your nose or some drainage.  This is from the oxygen used during your procedure.  There is no need for concern and it should clear up in a day or so.  SYMPTOMS TO REPORT IMMEDIATELY:  Following lower endoscopy (colonoscopy or flexible sigmoidoscopy):  Excessive amounts of blood in the stool  Significant tenderness or worsening of abdominal pains  Swelling of the abdomen that is new, acute  Fever of 100F or higher  For urgent or emergent issues, a gastroenterologist can be reached at any hour by calling [phone]. Do not use MyChart messaging for urgent concerns.    DIET:  We  do recommend a small meal at first, but then you may proceed to your regular diet.  Drink plenty of fluids but you should avoid alcoholic beverages for 24 hours.  ACTIVITY:  You should plan to take it easy for the rest of today and you should NOT DRIVE or use heavy machinery until tomorrow (because of the sedation medicines used during the test).    FOLLOW UP: Our staff will call the number listed on your records the next business day following your procedure.  We will call around 7:15- 8:00 am to check on you and address any questions or concerns that you may have regarding the information given to you following your procedure. If we do not reach you, we will leave a message.     If any biopsies were taken you will be contacted by phone or by letter within the next 1-3 weeks.  Please call us  at [phone] if you have not heard about the biopsies in 3 weeks.    SIGNATURES/CONFIDENTIALITY: You and/or your care partner have signed paperwork which will be entered into your electronic medical record.  These signatures attest to the fact that that the information above on your After Visit Summary has been reviewed and is understood.  Full responsibility of the confidentiality of this discharge information lies with you and/or your care-partner.

## 2024-01-05 NOTE — Telephone Encounter (Signed)
 Please advise on Prescription Request

## 2024-01-06 ENCOUNTER — Telehealth: Payer: Self-pay

## 2024-01-06 NOTE — Telephone Encounter (Signed)
  Follow up Call-     01/05/2024    7:20 AM  Call back number  Post procedure Call Back phone  # [phone]  Permission to leave phone message Yes     Patient questions:  Do you have a fever, pain , or abdominal swelling? No. Pain Score  0 *  Have you tolerated food without any problems? Yes.    Have you been able to return to your normal activities? Yes.    Do you have any questions about your discharge instructions: Diet   No. Medications  No. Follow up visit  No.  Do you have questions or concerns about your Care? No.  Actions: * If pain score is 4 or above: No action needed, pain <4.

## 2024-01-08 ENCOUNTER — Ambulatory Visit (INDEPENDENT_AMBULATORY_CARE_PROVIDER_SITE_OTHER): Payer: No Typology Code available for payment source | Admitting: Physician Assistant

## 2024-01-08 ENCOUNTER — Encounter: Payer: Self-pay | Admitting: Physician Assistant

## 2024-01-08 VITALS — BP 111/73 | HR 80 | Wt 129.6 lb

## 2024-01-08 DIAGNOSIS — M62838 Other muscle spasm: Secondary | ICD-10-CM | POA: Diagnosis not present

## 2024-01-08 DIAGNOSIS — G43709 Chronic migraine without aura, not intractable, without status migrainosus: Secondary | ICD-10-CM

## 2024-01-08 DIAGNOSIS — G43009 Migraine without aura, not intractable, without status migrainosus: Secondary | ICD-10-CM

## 2024-01-08 LAB — SURGICAL PATHOLOGY

## 2024-01-08 MED ORDER — ONABOTULINUMTOXINA 200 UNITS IJ SOLR
200.0000 [IU] | Freq: Once | INTRAMUSCULAR | Status: AC
  Administered 2024-01-08: 200 [IU] via INTRAMUSCULAR

## 2024-01-08 NOTE — Patient Instructions (Signed)
Botulinum Toxin Cosmetic Injection Botulinum toxin injection is a shot in the skin that is given to soften lines and wrinkles in the face. The medicine works by weakening the muscles in the face, which reduces the wrinkles when you move your face. Tell a doctor about: Any allergies you have, especially if you are allergic to eggs or milk. All the medicines you take. This includes: Antibiotics. Vitamins. Herbs. Eye drops. Creams. Over-the-counter medicines. Any bleeding problems you have. Any surgeries you have had. Any medical conditions you have. Whether you are pregnant or may be pregnant. Whether you are breastfeeding. What are the risks? Your doctor will talk with you about risks. These may include: Pain in the jaw. Pain in the neck. Allergic reaction to the shot. Infection. Drooping eyebrow or eyelid. Not being able to close one or both eyes. Double vision. Blurry vision. Changes in voice or speech. Trouble swallowing. What happens before the procedure? Ask your doctor about changing or stopping: Your normal medicines. Vitamins, herbs, and supplements. Over-the-counter medicines. Do not take aspirin or ibuprofen unless you are told to. Do not drink alcohol if your doctor tells you not to drink. What happens during the procedure?  The area to be treated may be: Chilled with ice. Numbed with medicine. Your doctor will give you some shots of the toxin. How many shots you get depends on: Your condition. Which area is being treated. The procedure may vary among doctors and hospitals. What can I expect after the procedure? After these shots, it is common to have: Pain, soreness, swelling, itching, or redness on your face. Small bruises from the needle. Bumps or marks from the needle. Loss of feeling in areas where the needle entered the body. Headache. This is rare. It may take up to 14 days for the treatment to work. It may work sooner for some people. Results will  last for about 3-4 months. Follow-up treatment will make this last longer. Follow these instructions at home: Managing pain and discomfort  Take over-the-counter and prescription medicines only as told by your doctor. If told, put ice on the affected area: Put ice in a plastic bag. Place a towel between your skin and the bag. Leave the ice on for 20 minutes, 2-3 times per day. If your skin turns bright red, take off the ice right away to prevent skin damage. The risk of damage is higher if you cannot feel pain, heat, or cold. Activity Do not do activities that take a lot of effort. Avoid these activities for 12 hours after the procedure or for as long as told by your doctor. This includes: Not lifting heavy items. Not working out. Not doing activities that make your heart beat faster. General instructions Do not lie down for 4 hours after treatment or as told by your doctor. Do not rub the area where you got the shot. This can spread the toxin. Depending on where the shot was given: Your doctor may ask you not to move your muscles in that area. Your doctor may tell you to frown or squint regularly. You may need to do these exercises every 15 minutes for 1 hour after the shots. Follow what the doctor tells you. Do not get laser treatments, facials, or facial massages for 1-2 weeks after the procedure or for as long as told by your doctor. Contact a doctor if: You have a headache that gets worse. You have a fever. Your eyelids start to get droopy or swollen. You have trouble  speaking. You have trouble swallowing. Get help right away if: You have signs of an allergic reaction. These include: An itchy rash or welts. Wheezing. You feel short of breath or have trouble breathing. Dizziness. These symptoms may be an emergency. Get help right away. Call 911. Do not wait to see if the symptoms will go away. Do not drive yourself to the hospital. This information is not intended to replace  advice given to you by your health care provider. Make sure you discuss any questions you have with your health care provider. Document Revised: 08/25/2022 Document Reviewed: 08/25/2022 Elsevier Patient Education  2024 [employer].

## 2024-01-08 NOTE — Progress Notes (Signed)
 CC: Here today for Botox    S: Pt in office today for Botox  injections. Her Botox  has been working very well for migraine prevention.    O: BP 111/73   Pulse 80   Wt 129 lb 9.6 oz (58.8 kg)   BMI 23.70 kg/m    Botox  Procedure Note Vial of Botox  was :sent to office and arrived 1 hour prior to appt time.  Botox  Dosing by [employer] for Chronic Migraine   Injection Sites for Migraines  Botox  155 units was injected using the dosage in the table above in the pattern shown above.45 units wasted  A: Migraine  Muscle spasm   P: Botox  155 units injected today.  RTC 3 Months.

## 2024-01-11 ENCOUNTER — Encounter: Payer: Self-pay | Admitting: Internal Medicine

## 2024-01-12 ENCOUNTER — Telehealth: Payer: Self-pay | Admitting: Internal Medicine

## 2024-01-12 NOTE — Telephone Encounter (Signed)
 Patient had T max 100 and indigestion, some constipation and abd cramping in the days after colonoscopy - fever came later  She is better today She saw PCP yesterday and resp virus testing negative and was thouight to have a viral syndrome  Temp is down, indigestion better  + some flatus and no sig abd pain reported  Overall better she says  Advised to call back if things deteriorate  Reviewed pathology of polyps and bxs  Colon recall 2 years

## 2024-02-28 NOTE — Therapy (Signed)
 OUTPATIENT PHYSICAL THERAPY FEMALE PELVIC EVALUATION   Patient Name: Shelly Wilkinson MRN: [id number] DOB:1971/04/24, 53 y.o., female Today's Date: 02/29/2024  END OF SESSION:  PT End of Session - 02/29/24 1900     Visit Number 1    Authorization Type [employer]    Authorization Time Period 3/32025-08/15/2024    PT Start Time 1445    PT Stop Time 1530    PT Time Calculation (min) 45 min    Activity Tolerance Patient tolerated treatment well;No increased pain    Behavior During Therapy [facility] for tasks assessed/performed             Past Medical History:  Diagnosis Date   Allergy    Anemia, iron deficiency 08/25/2016   Anxiety    Arthritis    Choroidal nevus    Chronic anal fissure    Complication of anesthesia    headaches/   06-22-2014 surg. had urinary retention   Constipation due to outlet dysfunction    Dyssynergic constipation 10/01/2016   Anorectal mano   Episcleritis-uveitis? 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   Left sided ulcerative colitis (? Crohn's) with complication (HCC) 11/18/2016   Diagnosed fall 2017. Indeterminate probably. Rectal involvement but patchy small ulcers. Initially chewed with Lialda and uceris was added. Changed to Humira May 2018. Prednisone started April 2018. Improved.     07/2022 Yusimry - biosimilar started   Macular degeneration    Migraines    Thrombosed hemorrhoids    Vitamin D deficiency 11/19/2016   Wears glasses    Past Surgical History:  Procedure Laterality Date   ANAL RECTAL MANOMETRY N/A 09/17/2016   Procedure: ANO RECTAL MANOMETRY;  Surgeon: Iva Boop, MD;  Location: [facility];  Service: Endoscopy;  Laterality: N/A;   CHEILECTOMY Right 12/15/2013   Procedure: RIGHT HALLUX CHEILECTOMY;  Surgeon: Toni Arthurs,  MD;  Location: [facility];  Service: Orthopedics;  Laterality: Right;   CHEILECTOMY Left 02-12-2006   great toe   COLONOSCOPY  10-02-2010   CYSTO/  HYDRODISTENTION/  INSTILLATION THERAPY  03-19-2010   D & C HYSTEROSCOPY /  HYDROTHERMAL ENDOMETRIAL ABLATION/  LEEP  05-29-2011   DILATION AND EVACUATION  2002   ESOPHAGOGASTRODUODENOSCOPY (EGD) WITH PROPOFOL N/A 04/17/2022   Procedure: ESOPHAGOGASTRODUODENOSCOPY (EGD) WITH PROPOFOL;  Surgeon: Rachael Fee, MD;  Location: [facility];  Service: Gastroenterology;  Laterality: N/A;   EUS N/A 04/17/2022   Procedure: UPPER ENDOSCOPIC ULTRASOUND (EUS) RADIAL;  Surgeon: Rachael Fee, MD;  Location: [facility];  Service: Gastroenterology;  Laterality: N/A;   EVALUATION UNDER ANESTHESIA WITH ANAL FISTULECTOMY N/A 12/26/2014   Procedure: EXAM UNDER ANESTHESIA ;  Surgeon: Romie Levee, MD;  Location: [facility];  Service: General;  Laterality: N/A;   HEMORRHOID BANDING  02-16-2014   REPAIR LACERATED RADIAL DIGITAL NERVE LEFT RING FINGER  08-25-2005   SPHINCTEROTOMY N/A 12/26/2014   Procedure: CHEMICAL SPHINCTEROTOMY (BOTOX);  Surgeon: Romie Levee, MD;  Location: [facility];  Service: General;  Laterality: N/A;   TRANSANAL HEMORRHOIDAL DEARTERIALIZATION  06-22-2014   Patient Active Problem List   Diagnosis Date Noted   Osteopenia after menopause 12/17/2023   Chronic migraine without aura without status migrainosus, not intractable 09/12/2022   Abnormal CT scan, stomach    Hyperinsulinemic  hypoglycemia 02/18/2022   Hypoglycemia 12/10/2021   Muscle spasm 11/30/2020   Abnormal mammogram of right breast 05/03/2018   Long-term use of immunosuppressant medication - Humira 06/16/2017   Vitamin D deficiency 11/19/2016   Left sided ulcerative colitis (? Crohn's) with complication (HCC) 11/18/2016   Anemia, iron deficiency 08/25/2016   Prolapsed internal hemorrhoids, grade 3 02/27/2016   Interstitial  cystitis 10/19/2012   Migraine headache 10/19/2012    PCP: Thana Ates, MD PCP - General  REFERRING PROVIDER: Lesly Dukes, MD  REFERRING DIAG: R15.9 (ICD-10-CM) - Incontinence of feces, unspecified fecal incontinence type  THERAPY DIAG:  Muscle weakness (generalized)  Other lack of coordination  Rationale for Evaluation and Treatment: Rehabilitation  ONSET DATE: few years- pt unable to tell exact date  SUBJECTIVE:                                                                                                                                                                                           SUBJECTIVE STATEMENT: Pt reports that she is not incontinent. When her dr was giving her a pelvic exam, she saw that she has a lot of tension. She has Crohns and intersticiial systitis.  Always feels like she has to go. She is used to it. Humira has made a big difference for her Crohns.  Years of problems messed her up.  Takes aloe vera vitamin pills, ic feels ok if she does not feel bad. Has a lot of frequency- if she does not go pee, has increased bladder pain Has been limited by low back pain and has not exercised Reports that she is split down the middle since she was pregnant  Has been doing yoga and goes to [employer]  Fluid intake: coffee and water  PAIN:  Are you having pain? Yes- low back and hips radiating to right LE NPRS scale: 5/10 Pain location:  low back   Pain type: aching Pain description: dull   Aggravating factors: bending forward Relieving factors: rest  PRECAUTIONS: None  RED FLAGS: None   WEIGHT BEARING RESTRICTIONS: No  FALLS:  Has patient fallen in last 6 months? No  OCCUPATION: teacher  ACTIVITY LEVEL : walks a lot   PLOF: Independent  PATIENT GOALS: reduce constipation, reduce urgency and frequency, always have control of her bowel and urine  PERTINENT HISTORY:  Crohn's Sexual abuse: No  BOWEL MOVEMENT: Pain with bowel  movement: No Type of bowel movement:Type (Bristol Stool Scale) 1-2 Fully empty rectum: No Leakage: Yes: - when she passes gas   at times Pads: No Fiber supplement/laxative No  URINATION: Pain with urination: No Fully empty  bladder: No Stream: Weak Urgency: Yes  Frequency: depends- every 30 mins-hr at home,  Leakage: Sneezing and Exercise- jumping Pads: Yes: pantyliner for hemorrhoids  INTERCOURSE:  Ability to have vaginal penetration Yes  Pain with intercourse: Initial Penetration, During Penetration, and Deep Penetration- vaginal inserts have helped DrynessYes  Climax: yes Marinoff Scale: 1/3 Laxative:  PREGNANCY: Vaginal deliveries 2- 25and 22 Tearing Yes:   Episiotomy No C-section deliveries 0 Currently pregnant No  PROLAPSE: None   OBJECTIVE:  Note: Objective measures were completed at Evaluation unless otherwise noted.    PFIQ-7: 88  COGNITION: Overall cognitive status: Within functional limits for tasks assessed     SENSATION: Light touch: Appears intact  LUMBAR SPECIAL TESTS:  Straight leg raise test: Negative Bilateral hip and knee weakness in single leg squat   GAIT: Assistive device utilized: None Comments: within functional limitations   POSTURE: rounded shoulders and forward head   LUMBARAROM/PROM: slightly limited bending by pain today   LOWER EXTREMITY ZOX:WRUEA within functional limitations   LOWER EXTREMITY MMT: bilateral  hip and knee weakness present   General: able to dem diaphragmatic breathing   Pelvic Alignment: seems even  Abdominal: tenderness throughout                External Perineal Exam: to be assessed                              Internal Pelvic Floor: to be assessed  Patient confirms identification and approves PT to assess internal pelvic floor and treatment Yes  PELVIC MMT:   MMT eval  Vaginal   Internal Anal Sphincter   External Anal Sphincter   Puborectalis   Diastasis Recti 1 finger  (Blank rows  = not tested)        TONE: Good tone TRA in hooklying  PROLAPSE: To be assessed   TODAY'S TREATMENT:                                                                                                                              DATE: 02/29/24   EVAL see below Neuro reed- transverse abdominis breath and core coordination exercises- see HEP below Manual- abdominal massage PATIENT EDUCATION/ there acts  Education details: relevant anatomy, HEP, exam findings Person educated: Patient Education method: Explanation, Demonstration, Tactile cues, Verbal cues, and Handouts Education comprehension: verbalized understanding, returned demonstration, verbal cues required, tactile cues required, and needs further education  HOME EXERCISE PROGRAM: [id number]  ASSESSMENT:  CLINICAL IMPRESSION: Patient is a 53 y.o. F who was seen today for physical therapy evaluation and treatment for stress urinary incontinence, urinary urgency d/t ic and constipation d/t Crohn's. She did well with education and exercises and will benefit from PT to reduce incontinence and improve QOL  OBJECTIVE IMPAIRMENTS: decreased coordination, decreased ROM, decreased strength, increased fascial restrictions, improper body mechanics, and pain.   ACTIVITY LIMITATIONS: continence and toileting  PARTICIPATION LIMITATIONS: community activity and occupation  PERSONAL FACTORS: Time since onset of injury/illness/exacerbation are also affecting patient's functional outcome.   REHAB POTENTIAL: Good  CLINICAL DECISION MAKING: Evolving/moderate complexity  EVALUATION COMPLEXITY: Moderate   GOALS: Goals reviewed with patient? Yes  SHORT TERM GOALS: Target date: 02/29/24   Pt will be independent with HEP.   Baseline: Goal status: INITIAL  2.  Pt will be independent with the knack, urge suppression technique, and double voiding in order to improve bladder habits and decrease urinary incontinence.   Baseline:  Goal status:  INITIAL  3.  Pt to demonstrate improved coordination of pelvic floor and breathing mechanics with 10# squat with appropriate synergistic patterns to decrease pain and leakage at least 75% of the time.    Baseline:  Goal status: INITIAL  4.  Pt will be independent with use of squatty potty, relaxed toileting mechanics, and improved bowel movement techniques in order to increase ease of bowel movements and complete evacuation.   Baseline:  Goal status: INITIAL    LONG TERM GOALS: Target date: 08/15/2024  Pt will soak 0 pads/ day Baseline:  Goal status: INITIAL  2.  Pt will report complete emptying Baseline:  Goal status: INITIAL  3.  Pt will be I with advanced HEP Baseline:  Goal status: INITIAL  4.  Pt will report 0/10 pain with her HEP Baseline:  Goal status: INITIAL  5.  Pt will report 0 pain with intercourse Baseline:  Goal status: INITIAL  PLAN:  PT FREQUENCY: 1-2x/week  PT DURATION: 6 months  PLANNED INTERVENTIONS: 97110-Therapeutic exercises, 97530- Therapeutic activity, 97112- Neuromuscular re-education, 97535- Self Care, [id number]- Manual therapy, [phone]- Electrical stimulation (manual), Dry Needling, Joint mobilization, Joint manipulation, Spinal manipulation, Spinal mobilization, Scar mobilization, and Biofeedback  PLAN FOR NEXT SESSION: internal pelvic floor muscle assessment   Erminio Nygard, PT 02/29/2024, 7:03 PM

## 2024-02-29 ENCOUNTER — Other Ambulatory Visit: Payer: Self-pay

## 2024-02-29 ENCOUNTER — Ambulatory Visit: Payer: No Typology Code available for payment source | Attending: Obstetrics & Gynecology | Admitting: Physical Therapy

## 2024-02-29 ENCOUNTER — Encounter: Payer: Self-pay | Admitting: Physical Therapy

## 2024-02-29 DIAGNOSIS — M6281 Muscle weakness (generalized): Secondary | ICD-10-CM | POA: Insufficient documentation

## 2024-02-29 DIAGNOSIS — R159 Full incontinence of feces: Secondary | ICD-10-CM | POA: Insufficient documentation

## 2024-02-29 DIAGNOSIS — R278 Other lack of coordination: Secondary | ICD-10-CM | POA: Insufficient documentation

## 2024-03-14 ENCOUNTER — Ambulatory Visit: Payer: No Typology Code available for payment source | Admitting: Physical Therapy

## 2024-03-14 ENCOUNTER — Encounter: Payer: Self-pay | Admitting: Physical Therapy

## 2024-03-14 DIAGNOSIS — M6281 Muscle weakness (generalized): Secondary | ICD-10-CM

## 2024-03-14 DIAGNOSIS — R159 Full incontinence of feces: Secondary | ICD-10-CM | POA: Diagnosis not present

## 2024-03-14 DIAGNOSIS — R278 Other lack of coordination: Secondary | ICD-10-CM

## 2024-03-14 NOTE — Therapy (Signed)
 OUTPATIENT PHYSICAL THERAPY FEMALE PELVIC TREATMENT   Patient Name: Shelly Wilkinson MRN: [id number] DOB:Dec 26, 1971, 53 y.o., female Today's Date: 03/14/2024  END OF SESSION:  PT End of Session - 03/14/24 1532     Visit Number 2    Authorization Type [employer]    Authorization Time Period 3/32025-08/15/2024    PT Start Time 1530    PT Stop Time 1615    PT Time Calculation (min) 45 min    Activity Tolerance Patient tolerated treatment well;No increased pain    Behavior During Therapy [facility] for tasks assessed/performed              Past Medical History:  Diagnosis Date   Allergy    Anemia, iron deficiency 08/25/2016   Anxiety    Arthritis    Choroidal nevus    Chronic anal fissure    Complication of anesthesia    headaches/   06-22-2014 surg. had urinary retention   Constipation due to outlet dysfunction    Dyssynergic constipation 10/01/2016   Anorectal mano   Episcleritis-uveitis? 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   Left sided ulcerative colitis (? Crohn's) with complication (HCC) 11/18/2016   Diagnosed fall 2017. Indeterminate probably. Rectal involvement but patchy small ulcers. Initially chewed with Lialda and uceris was added. Changed to Humira May 2018. Prednisone started April 2018. Improved.     07/2022 Yusimry - biosimilar started   Macular degeneration    Migraines    Thrombosed hemorrhoids    Vitamin D deficiency 11/19/2016   Wears glasses    Past Surgical History:  Procedure Laterality Date   ANAL RECTAL MANOMETRY N/A 09/17/2016   Procedure: ANO RECTAL MANOMETRY;  Surgeon: Iva Boop, MD;  Location: [facility];  Service: Endoscopy;  Laterality: N/A;   CHEILECTOMY Right 12/15/2013   Procedure: RIGHT HALLUX CHEILECTOMY;  Surgeon: Toni Arthurs, MD;  Location: [facility];  Service: Orthopedics;  Laterality: Right;   CHEILECTOMY Left 02-12-2006   great toe   COLONOSCOPY  10-02-2010   CYSTO/  HYDRODISTENTION/  INSTILLATION THERAPY  03-19-2010   D & C HYSTEROSCOPY /  HYDROTHERMAL ENDOMETRIAL ABLATION/  LEEP  05-29-2011   DILATION AND EVACUATION  2002   ESOPHAGOGASTRODUODENOSCOPY (EGD) WITH PROPOFOL N/A 04/17/2022   Procedure: ESOPHAGOGASTRODUODENOSCOPY (EGD) WITH PROPOFOL;  Surgeon: Rachael Fee, MD;  Location: [facility];  Service: Gastroenterology;  Laterality: N/A;   EUS N/A 04/17/2022   Procedure: UPPER ENDOSCOPIC ULTRASOUND (EUS) RADIAL;  Surgeon: Rachael Fee, MD;  Location: [facility];  Service: Gastroenterology;  Laterality: N/A;   EVALUATION UNDER ANESTHESIA WITH ANAL FISTULECTOMY N/A 12/26/2014   Procedure: EXAM UNDER ANESTHESIA ;  Surgeon: Romie Levee, MD;  Location: [facility];  Service: General;  Laterality: N/A;   HEMORRHOID BANDING  02-16-2014   REPAIR LACERATED RADIAL DIGITAL NERVE LEFT RING FINGER  08-25-2005   SPHINCTEROTOMY N/A 12/26/2014   Procedure: CHEMICAL SPHINCTEROTOMY (BOTOX);  Surgeon: Romie Levee, MD;  Location: [facility];  Service: General;  Laterality: N/A;   TRANSANAL HEMORRHOIDAL DEARTERIALIZATION  06-22-2014   Patient Active Problem List   Diagnosis Date Noted   Osteopenia after menopause 12/17/2023   Chronic migraine without aura without status migrainosus, not intractable 09/12/2022   Abnormal CT scan, stomach  Hyperinsulinemic hypoglycemia 02/18/2022   Hypoglycemia 12/10/2021   Muscle spasm 11/30/2020   Abnormal mammogram of right breast 05/03/2018   Long-term use of immunosuppressant medication - Humira 06/16/2017   Vitamin D deficiency 11/19/2016   Left sided ulcerative colitis (? Crohn's) with complication (HCC) 11/18/2016   Anemia, iron deficiency 08/25/2016   Prolapsed internal hemorrhoids, grade 3 02/27/2016    Interstitial cystitis 10/19/2012   Migraine headache 10/19/2012    PCP: Thana Ates, MD PCP - General  REFERRING PROVIDER: Lesly Dukes, MD  REFERRING DIAG: R15.9 (ICD-10-CM) - Incontinence of feces, unspecified fecal incontinence type  THERAPY DIAG:  Muscle weakness (generalized)  Other lack of coordination  Rationale for Evaluation and Treatment: Rehabilitation  ONSET DATE: few years- pt unable to tell exact date  SUBJECTIVE:                                                                                                                                                                                           SUBJECTIVE STATEMENT: Pt reports that surgery for hemorrhoids helped some in the past but then she got a fissure, she had botox down there .It was awful.  She will never do it again, is sure that it is contributing to her issues.  Has been doing urge drill.  Has been constipated, little pieces come out Afraid that she will pass gas during yoga Does estradiol suppositories 2x/ week for dryness.Intercourse used to be uncomfortable.  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     Pt reports that she is not incontinent. When her dr was giving her a pelvic exam, she saw that she has a lot of tension. She has Crohns and intersticiial cystitis.  Always feels like she has to go. She is used to it. Humira has made a big difference for her Crohns.  Years of problems messed her up.  Takes aloe vera vitamin pills, ic feels ok if she does not feel bad. Has a lot of frequency- if she does not go pee, has increased bladder pain Has been limited by low back pain and has not exercised Reports that she is split down the middle since she was pregnant  Has been doing yoga and goes to [employer]  Fluid intake: coffee and water  PAIN:  Are you having pain? Yes- low back and hips radiating to right LE NPRS scale: 5/10 Pain location:  low back   Pain type: aching Pain description: dull   Aggravating  factors: bending forward Relieving factors: rest  PRECAUTIONS: None  RED FLAGS: None   WEIGHT BEARING RESTRICTIONS: No  FALLS:  Has patient fallen in last 6 months? No  OCCUPATION: teacher  ACTIVITY LEVEL : walks a lot   PLOF: Independent  PATIENT GOALS: reduce constipation, reduce urgency and frequency, always have control of her bowel and urine  PERTINENT HISTORY:  Crohn's Sexual abuse: No  BOWEL MOVEMENT: Pain with bowel movement: No Type of bowel movement:Type (Bristol Stool Scale) 1-2 Fully empty rectum: No Leakage: Yes: - when she passes gas   at times Pads: No Fiber supplement/laxative No  URINATION: Pain with urination: No Fully empty bladder: No Stream: Weak Urgency: Yes  Frequency: depends- every 30 mins-hr at home,  Leakage: Sneezing and Exercise- jumping Pads: Yes: pantyliner for hemorrhoids  INTERCOURSE:  Ability to have vaginal penetration Yes  Pain with intercourse: Initial Penetration, During Penetration, and Deep Penetration- vaginal inserts have helped DrynessYes  Climax: yes Marinoff Scale: 1/3 Laxative:  PREGNANCY: Vaginal deliveries 2- 25and 22 Tearing Yes:   Episiotomy No C-section deliveries 0 Currently pregnant No  PROLAPSE: None   OBJECTIVE:  Note: Objective measures were completed at Evaluation unless otherwise noted.    PFIQ-7: 88  COGNITION: Overall cognitive status: Within functional limits for tasks assessed     SENSATION: Light touch: Appears intact  LUMBAR SPECIAL TESTS:  Straight leg raise test: Negative Bilateral hip and knee weakness in single leg squat   GAIT: Assistive device utilized: None Comments: within functional limitations   POSTURE: rounded shoulders and forward head   LUMBARAROM/PROM: slightly limited bending by pain today   LOWER EXTREMITY NFA:[id number] within functional limitations   LOWER EXTREMITY MMT: bilateral  hip and knee weakness present   General: able to dem diaphragmatic  breathing   Pelvic Alignment: seems even  Abdominal: tenderness throughout                External Perineal Exam: to be assessed                              Internal Pelvic Floor: to be assessed  Patient confirms identification and approves PT to assess internal pelvic floor and treatment Yes  PELVIC MMT:   MMT eval  Vaginal   Internal Anal Sphincter   External Anal Sphincter   Puborectalis   Diastasis Recti 1 finger  (Blank rows = not tested)        TONE: Good tone TRA in hooklying  PROLAPSE: To be assessed   TODAY'S TREATMENT:                                                                                                                              DATE: 03/14/24    Neuro reed- transverse abdominis breath, pelvic floor downtraining, deep squat with diaphragmatic breathing   Manual- abdominal massage There acts- constipation strategies, downtraining of pelvic muscles education, relevant anatomy  PATIENT EDUCATION/ there acts  Education details: relevant anatomy, HEP, exam findings Person educated: Patient Education method: Explanation, Demonstration, Tactile cues, Verbal cues, and Handouts Education comprehension:  verbalized understanding, returned demonstration, verbal cues required, tactile cues required, and needs further education  HOME EXERCISE PROGRAM: [id number]  ASSESSMENT:  CLINICAL IMPRESSION: Tx focus- manual abdominal massage, education and pelvic floor downtraining exercises.  OBJECTIVE IMPAIRMENTS: decreased coordination, decreased ROM, decreased strength, increased fascial restrictions, improper body mechanics, and pain.   ACTIVITY LIMITATIONS: continence and toileting  PARTICIPATION LIMITATIONS: community activity and occupation  PERSONAL FACTORS: Time since onset of injury/illness/exacerbation are also affecting patient's functional outcome.   REHAB POTENTIAL: Good  CLINICAL DECISION MAKING: Evolving/moderate complexity  EVALUATION  COMPLEXITY: Moderate   GOALS: Goals reviewed with patient? Yes  SHORT TERM GOALS: Target date: 03/14/24   Pt will be independent with HEP.   Baseline: Goal status: INITIAL  2.  Pt will be independent with the knack, urge suppression technique, and double voiding in order to improve bladder habits and decrease urinary incontinence.   Baseline:  Goal status: INITIAL  3.  Pt to demonstrate improved coordination of pelvic floor and breathing mechanics with 10# squat with appropriate synergistic patterns to decrease pain and leakage at least 75% of the time.    Baseline:  Goal status: INITIAL  4.  Pt will be independent with use of squatty potty, relaxed toileting mechanics, and improved bowel movement techniques in order to increase ease of bowel movements and complete evacuation.   Baseline:  Goal status: INITIAL    LONG TERM GOALS: Target date: 08/15/2024  Pt will soak 0 pads/ day Baseline:  Goal status: INITIAL  2.  Pt will report complete emptying Baseline:  Goal status: INITIAL  3.  Pt will be I with advanced HEP Baseline:  Goal status: INITIAL  4.  Pt will report 0/10 pain with her HEP Baseline:  Goal status: INITIAL  5.  Pt will report 0 pain with intercourse Baseline:  Goal status: INITIAL  PLAN:  PT FREQUENCY: 1-2x/week  PT DURATION: 6 months  PLANNED INTERVENTIONS: 97110-Therapeutic exercises, 97530- Therapeutic activity, 97112- Neuromuscular re-education, 97535- Self Care, 78469- Manual therapy, [phone]- Electrical stimulation (manual), Dry Needling, Joint mobilization, Joint manipulation, Spinal manipulation, Spinal mobilization, Scar mobilization, and Biofeedback  PLAN FOR NEXT SESSION: internal pelvic floor muscle assessment   Indiyah Paone, PT 03/14/2024, 4:18 PM

## 2024-03-21 ENCOUNTER — Ambulatory Visit: Payer: No Typology Code available for payment source | Admitting: Physical Therapy

## 2024-03-21 DIAGNOSIS — R159 Full incontinence of feces: Secondary | ICD-10-CM | POA: Diagnosis not present

## 2024-03-21 DIAGNOSIS — R278 Other lack of coordination: Secondary | ICD-10-CM

## 2024-03-21 DIAGNOSIS — M6281 Muscle weakness (generalized): Secondary | ICD-10-CM

## 2024-03-21 NOTE — Therapy (Signed)
 OUTPATIENT PHYSICAL THERAPY FEMALE PELVIC TREATMENT   Patient Name: Shelly Wilkinson MRN: [id number] DOB:[birth year], 53 y.o., female Today's Date: 03/21/2024  END OF SESSION:  PT End of Session - 03/21/24 1634     Visit Number 3    Authorization Type [employer]    Authorization Time Period 3/32025-08/15/2024    PT Start Time 1535    PT Stop Time 1626    PT Time Calculation (min) 51 min    Activity Tolerance Patient tolerated treatment well;No increased pain    Behavior During Therapy [facility] for tasks assessed/performed               Past Medical History:  Diagnosis Date   Allergy    Anemia, iron deficiency 08/25/2016   Anxiety    Arthritis    Choroidal nevus    Chronic anal fissure    Complication of anesthesia    headaches/   06-22-2014 surg. had urinary retention   Constipation due to outlet dysfunction    Dyssynergic constipation 10/01/2016   Anorectal mano   Episcleritis-uveitis? 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   Left sided ulcerative colitis (? Crohn's) with complication (HCC) 11/18/2016   Diagnosed fall 2017. Indeterminate probably. Rectal involvement but patchy small ulcers. Initially chewed with Lialda and uceris was added. Changed to Humira May 2018. Prednisone started April 2018. Improved.     07/2022 Yusimry - biosimilar started   Macular degeneration    Migraines    Thrombosed hemorrhoids    Vitamin D deficiency 11/19/2016   Wears glasses    Past Surgical History:  Procedure Laterality Date   ANAL RECTAL MANOMETRY N/A 09/17/2016   Procedure: ANO RECTAL MANOMETRY;  Surgeon: Iva Boop, MD;  Location: [facility];  Service: Endoscopy;  Laterality: N/A;   CHEILECTOMY Right 12/15/2013   Procedure: RIGHT HALLUX CHEILECTOMY;  Surgeon: Toni Arthurs, MD;  Location: [facility];  Service: Orthopedics;  Laterality: Right;   CHEILECTOMY Left 02-12-2006   great toe   COLONOSCOPY  10-02-2010   CYSTO/  HYDRODISTENTION/  INSTILLATION THERAPY  03-19-2010   D & C HYSTEROSCOPY /  HYDROTHERMAL ENDOMETRIAL ABLATION/  LEEP  05-29-2011   DILATION AND EVACUATION  2002   ESOPHAGOGASTRODUODENOSCOPY (EGD) WITH PROPOFOL N/A 04/17/2022   Procedure: ESOPHAGOGASTRODUODENOSCOPY (EGD) WITH PROPOFOL;  Surgeon: Rachael Fee, MD;  Location: [facility];  Service: Gastroenterology;  Laterality: N/A;   EUS N/A 04/17/2022   Procedure: UPPER ENDOSCOPIC ULTRASOUND (EUS) RADIAL;  Surgeon: Rachael Fee, MD;  Location: [facility];  Service: Gastroenterology;  Laterality: N/A;   EVALUATION UNDER ANESTHESIA WITH ANAL FISTULECTOMY N/A 12/26/2014   Procedure: EXAM UNDER ANESTHESIA ;  Surgeon: Romie Levee, MD;  Location: [facility];  Service: General;  Laterality: N/A;   HEMORRHOID BANDING  02-16-2014   REPAIR LACERATED RADIAL DIGITAL NERVE LEFT RING FINGER  08-25-2005   SPHINCTEROTOMY N/A 12/26/2014   Procedure: CHEMICAL SPHINCTEROTOMY (BOTOX);  Surgeon: Romie Levee, MD;  Location: [facility];  Service: General;  Laterality: N/A;   TRANSANAL HEMORRHOIDAL DEARTERIALIZATION  06-22-2014   Patient Active Problem List   Diagnosis Date Noted   Osteopenia after menopause 12/17/2023   Chronic migraine without aura without status migrainosus, not intractable 09/12/2022   Abnormal CT scan, stomach  Hyperinsulinemic hypoglycemia 02/18/2022   Hypoglycemia 12/10/2021   Muscle spasm 11/30/2020   Abnormal mammogram of right breast 05/03/2018   Long-term use of immunosuppressant medication - Humira 06/16/2017   Vitamin D deficiency 11/19/2016   Left sided ulcerative colitis (? Crohn's) with complication (HCC) 11/18/2016   Anemia, iron deficiency 08/25/2016   Prolapsed internal hemorrhoids, grade 3 02/27/2016    Interstitial cystitis 10/19/2012   Migraine headache 10/19/2012    PCP: Thana Ates, MD PCP - General  REFERRING PROVIDER: Lesly Dukes, MD  REFERRING DIAG: R15.9 (ICD-10-CM) - Incontinence of feces, unspecified fecal incontinence type  THERAPY DIAG:  Muscle weakness (generalized)  Other lack of coordination  Rationale for Evaluation and Treatment: Rehabilitation  ONSET DATE: few years- pt unable to tell exact date  SUBJECTIVE:                                                                                                                                                                                           SUBJECTIVE STATEMENT: Pt reports that she is going to pee less often, trying to spread it out. Her goals is not to end up in [place] Massage helps for constipation, makes her feel better Still feels like she has to go.  Makes her own sparkling water and coffee.    Fluid intake: coffee and water  PAIN:  Are you having pain? Yes- low back and hips radiating to right LE NPRS scale: 5/10 Pain location:  low back   Pain type: aching Pain description: dull   Aggravating factors: bending forward Relieving factors: rest  PRECAUTIONS: None  RED FLAGS: None   WEIGHT BEARING RESTRICTIONS: No  FALLS:  Has patient fallen in last 6 months? No  OCCUPATION: teacher  ACTIVITY LEVEL : walks a lot   PLOF: Independent  PATIENT GOALS: reduce constipation, reduce urgency and frequency, always have control of her bowel and urine  PERTINENT HISTORY:  Crohn's Sexual abuse: No  BOWEL MOVEMENT: Pain with bowel movement: No Type of bowel movement:Type (Bristol Stool Scale) 1-2 Fully empty rectum: No Leakage: Yes: - when she passes gas   at times Pads: No Fiber supplement/laxative No  URINATION: Pain with urination: No Fully empty bladder: No Stream: Weak Urgency: Yes  Frequency: depends- every 30 mins-hr at home,  Leakage: Sneezing and Exercise-  jumping Pads: Yes: pantyliner for hemorrhoids  INTERCOURSE:  Ability to have vaginal penetration Yes  Pain with intercourse: Initial Penetration, During Penetration, and Deep Penetration- vaginal inserts have helped DrynessYes  Climax: yes Marinoff Scale: 1/3 Laxative:  PREGNANCY: Vaginal deliveries 2- 25and 22 Tearing Yes:   Episiotomy No C-section deliveries  0 Currently pregnant No  PROLAPSE: None   OBJECTIVE:  Note: Objective measures were completed at Evaluation unless otherwise noted.    PFIQ-7: 88  COGNITION: Overall cognitive status: Within functional limits for tasks assessed     SENSATION: Light touch: Appears intact  LUMBAR SPECIAL TESTS:  Straight leg raise test: Negative Bilateral hip and knee weakness in single leg squat   GAIT: Assistive device utilized: None Comments: within functional limitations   POSTURE: rounded shoulders and forward head   LUMBARAROM/PROM: slightly limited bending by pain today   LOWER EXTREMITY URK:YHCWC within functional limitations   LOWER EXTREMITY MMT: bilateral  hip and knee weakness present   General: able to dem diaphragmatic breathing   Pelvic Alignment: seems even  Abdominal: tenderness throughout                External Perineal Exam: seems within functional limitations                Internal Pelvic Floor: tight and tender bulbo more on left, tight and tender perineal scar, some dryness present Patient confirms identification and approves PT to assess internal pelvic floor and treatment Yes  PELVIC MMT:   MMT eval  Vaginal 2/5  Internal Anal Sphincter   External Anal Sphincter   Puborectalis   Diastasis Recti 1 finger  (Blank rows = not tested)        TONE: Good tone TRA in hooklying  PROLAPSE: None noticed in hook lying   TODAY'S TREATMENT:                                                                                                                              DATE: 03/21/24     There acts- trial of TTNS for urgency,  pelvic rocks to improve bladder emptying, urge drill, lubricants  Manual- internal pelvic floor muscle assessment and treatment- massage- layer 1 and 2,  pt educated on left bulbo massage with diaphragmatic breathing and left knee fallout to reduce urgency    HOME EXERCISE PROGRAM: [id number]  ASSESSMENT:  CLINICAL IMPRESSION: Tx focus-  education on TTNS, interstitial cystitis materials ( book by [employer]), manual pelvic floor muscle release - bulbo on left, bladder irritants. Pt to reduce sparkling water for 2 weeks for bladder reset. She has a lot of perineal and left bulbo tension, will address with self massage at home with a lubricant. She will continue to benefit from PT to reduce urgency and constipation.     OBJECTIVE IMPAIRMENTS: decreased coordination, decreased ROM, decreased strength, increased fascial restrictions, improper body mechanics, and pain.   ACTIVITY LIMITATIONS: continence and toileting  PARTICIPATION LIMITATIONS: community activity and occupation  PERSONAL FACTORS: Time since onset of injury/illness/exacerbation are also affecting patient's functional outcome.   REHAB POTENTIAL: Good  CLINICAL DECISION MAKING: Evolving/moderate complexity  EVALUATION COMPLEXITY: Moderate   GOALS: Goals reviewed with patient? Yes  SHORT TERM GOALS: Target date: 03/21/24   Pt will be  independent with HEP.   Baseline: Goal status: INITIAL  2.  Pt will be independent with the knack, urge suppression technique, and double voiding in order to improve bladder habits and decrease urinary incontinence.   Baseline:  Goal status: progressing  3.  Pt to demonstrate improved coordination of pelvic floor and breathing mechanics with 10# squat with appropriate synergistic patterns to decrease pain and leakage at least 75% of the time.    Baseline:  Goal status: INITIAL  4.  Pt will be independent with use of squatty potty, relaxed  toileting mechanics, and improved bowel movement techniques in order to increase ease of bowel movements and complete evacuation.   Baseline:  Goal status: progressing    LONG TERM GOALS: Target date: 08/15/2024  Pt will soak 0 pads/ day Baseline:  Goal status: INITIAL  2.  Pt will report complete emptying Baseline:  Goal status: INITIAL  3.  Pt will be I with advanced HEP Baseline:  Goal status: INITIAL  4.  Pt will report 0/10 pain with her HEP Baseline:  Goal status: INITIAL  5.  Pt will report 0 pain with intercourse Baseline:  Goal status: INITIAL  PLAN:  PT FREQUENCY: 1-2x/week  PT DURATION: 6 months  PLANNED INTERVENTIONS: 97110-Therapeutic exercises, 97530- Therapeutic activity, 97112- Neuromuscular re-education, 97535- Self Care, 16109- Manual therapy, [phone]- Electrical stimulation (manual), Dry Needling, Joint mobilization, Joint manipulation, Spinal manipulation, Spinal mobilization, Scar mobilization, and Biofeedback  PLAN FOR NEXT SESSION:  check prolapse in standing, check ab exercises   Ali Mclaurin, PT 03/21/2024, 4:36 PM

## 2024-03-22 ENCOUNTER — Encounter: Payer: Self-pay | Admitting: Internal Medicine

## 2024-03-22 LAB — HEPATIC FUNCTION PANEL
ALT: 54 IU/L — ABNORMAL HIGH (ref 0–32)
AST: 49 IU/L — ABNORMAL HIGH (ref 0–40)
Albumin: 4.7 g/dL (ref 3.8–4.9)
Alkaline Phosphatase: 138 IU/L — ABNORMAL HIGH (ref 44–121)
Bilirubin Total: 1 mg/dL (ref 0.0–1.2)
Bilirubin, Direct: 0.33 mg/dL (ref 0.00–0.40)
Total Protein: 7.2 g/dL (ref 6.0–8.5)

## 2024-03-24 ENCOUNTER — Other Ambulatory Visit: Payer: Self-pay

## 2024-03-24 DIAGNOSIS — R7989 Other specified abnormal findings of blood chemistry: Secondary | ICD-10-CM

## 2024-03-25 ENCOUNTER — Other Ambulatory Visit: Payer: Self-pay

## 2024-03-25 DIAGNOSIS — R7989 Other specified abnormal findings of blood chemistry: Secondary | ICD-10-CM

## 2024-03-28 ENCOUNTER — Ambulatory Visit: Payer: No Typology Code available for payment source | Admitting: Physical Therapy

## 2024-03-29 ENCOUNTER — Ambulatory Visit (HOSPITAL_BASED_OUTPATIENT_CLINIC_OR_DEPARTMENT_OTHER)

## 2024-04-01 ENCOUNTER — Encounter: Payer: Self-pay | Admitting: Physician Assistant

## 2024-04-01 ENCOUNTER — Ambulatory Visit (INDEPENDENT_AMBULATORY_CARE_PROVIDER_SITE_OTHER): Payer: No Typology Code available for payment source | Admitting: Physician Assistant

## 2024-04-01 VITALS — BP 122/79 | HR 81 | Wt 133.4 lb

## 2024-04-01 DIAGNOSIS — G43709 Chronic migraine without aura, not intractable, without status migrainosus: Secondary | ICD-10-CM | POA: Diagnosis not present

## 2024-04-01 DIAGNOSIS — M62838 Other muscle spasm: Secondary | ICD-10-CM

## 2024-04-01 MED ORDER — ONABOTULINUMTOXINA 200 UNITS IJ SOLR
200.0000 [IU] | Freq: Once | INTRAMUSCULAR | Status: AC
Start: 2024-04-01 — End: 2024-04-01
  Administered 2024-04-01: 200 [IU] via INTRAMUSCULAR

## 2024-04-01 NOTE — Progress Notes (Signed)
 CC: Botox   S: Pt in office today for Botox injections. Her Botox has been working very well for migraine prevention   O: BP 122/79   Pulse 81   Wt 133 lb 6.4 oz (60.5 kg)   BMI 24.40 kg/m   Botox Procedure Note Vial of Botox was :available in office fridge  Botox Dosing by [facility] for Chronic Migraine   Injection Sites for Migraines  Botox 155 units was injected using the dosage in the table above in the pattern shown above.  45 units wasted  A: Migraine  Muscle spasm   P: Botox 155 units injected today.  Pt also encouraged to try ice to help with her muscle spasm RTC 3 Months.

## 2024-04-04 ENCOUNTER — Ambulatory Visit: Payer: No Typology Code available for payment source | Attending: Obstetrics & Gynecology | Admitting: Physical Therapy

## 2024-04-04 ENCOUNTER — Encounter: Payer: Self-pay | Admitting: Physical Therapy

## 2024-04-04 DIAGNOSIS — R278 Other lack of coordination: Secondary | ICD-10-CM | POA: Diagnosis present

## 2024-04-04 DIAGNOSIS — M6281 Muscle weakness (generalized): Secondary | ICD-10-CM | POA: Insufficient documentation

## 2024-04-04 NOTE — Therapy (Signed)
 OUTPATIENT PHYSICAL THERAPY FEMALE PELVIC TREATMENT   Patient Name: Shelly Wilkinson MRN: [id number] DOB:09/24/1971, 53 y.o., female Today's Date: 04/04/2024  END OF SESSION:  PT End of Session - 04/04/24 1533     Visit Number 4    Authorization Type [employer]    Authorization Time Period 3/32025-08/15/2024    PT Start Time 1530    PT Stop Time 1615    PT Time Calculation (min) 45 min    Activity Tolerance Patient tolerated treatment well;No increased pain    Behavior During Therapy [facility] for tasks assessed/performed                Past Medical History:  Diagnosis Date   Allergy    Anemia, iron deficiency 08/25/2016   Anxiety    Arthritis    Choroidal nevus    Chronic anal fissure    Complication of anesthesia    headaches/   06-22-2014 surg. had urinary retention   Constipation due to outlet dysfunction    Dyssynergic constipation 10/01/2016   Anorectal mano   Episcleritis-uveitis? 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   Left sided ulcerative colitis (? Crohn's) with complication (HCC) 11/18/2016   Diagnosed fall 2017. Indeterminate probably. Rectal involvement but patchy small ulcers. Initially chewed with Lialda and uceris was added. Changed to Humira May 2018. Prednisone started April 2018. Improved.     07/2022 Yusimry - biosimilar started   Macular degeneration    Migraines    Thrombosed hemorrhoids    Vitamin D deficiency 11/19/2016   Wears glasses    Past Surgical History:  Procedure Laterality Date   ANAL RECTAL MANOMETRY N/A 09/17/2016   Procedure: ANO RECTAL MANOMETRY;  Surgeon: Iva Boop, MD;  Location: [facility];  Service: Endoscopy;  Laterality: N/A;   CHEILECTOMY Right 12/15/2013   Procedure: RIGHT HALLUX CHEILECTOMY;  Surgeon: Toni Arthurs, MD;  Location: [facility];  Service: Orthopedics;  Laterality: Right;   CHEILECTOMY Left 02-12-2006   great toe   COLONOSCOPY  10-02-2010   CYSTO/  HYDRODISTENTION/  INSTILLATION THERAPY  03-19-2010   D & C HYSTEROSCOPY /  HYDROTHERMAL ENDOMETRIAL ABLATION/  LEEP  05-29-2011   DILATION AND EVACUATION  2002   ESOPHAGOGASTRODUODENOSCOPY (EGD) WITH PROPOFOL N/A 04/17/2022   Procedure: ESOPHAGOGASTRODUODENOSCOPY (EGD) WITH PROPOFOL;  Surgeon: Rachael Fee, MD;  Location: [facility];  Service: Gastroenterology;  Laterality: N/A;   EUS N/A 04/17/2022   Procedure: UPPER ENDOSCOPIC ULTRASOUND (EUS) RADIAL;  Surgeon: Rachael Fee, MD;  Location: [facility];  Service: Gastroenterology;  Laterality: N/A;   EVALUATION UNDER ANESTHESIA WITH ANAL FISTULECTOMY N/A 12/26/2014   Procedure: EXAM UNDER ANESTHESIA ;  Surgeon: Romie Levee, MD;  Location: [facility];  Service: General;  Laterality: N/A;   HEMORRHOID BANDING  02-16-2014   REPAIR LACERATED RADIAL DIGITAL NERVE LEFT RING FINGER  08-25-2005   SPHINCTEROTOMY N/A 12/26/2014   Procedure: CHEMICAL SPHINCTEROTOMY (BOTOX);  Surgeon: Romie Levee, MD;  Location: [facility];  Service: General;  Laterality: N/A;   TRANSANAL HEMORRHOIDAL DEARTERIALIZATION  06-22-2014   Patient Active Problem List   Diagnosis Date Noted   Osteopenia after menopause 12/17/2023   Chronic migraine without aura without status migrainosus, not intractable 09/12/2022   Abnormal CT scan, stomach  Hyperinsulinemic hypoglycemia 02/18/2022   Hypoglycemia 12/10/2021   Muscle spasm 11/30/2020   Abnormal mammogram of right breast 05/03/2018   Long-term use of immunosuppressant medication - Humira 06/16/2017   Vitamin D deficiency 11/19/2016   Left sided ulcerative colitis (? Crohn's) with complication (HCC) 11/18/2016   Anemia, iron deficiency 08/25/2016   Prolapsed internal hemorrhoids, grade 3 02/27/2016    Interstitial cystitis 10/19/2012   Migraine headache 10/19/2012    PCP: Thana Ates, MD PCP - General  REFERRING PROVIDER: Lesly Dukes, MD  REFERRING DIAG: R15.9 (ICD-10-CM) - Incontinence of feces, unspecified fecal incontinence type  THERAPY DIAG:  Muscle weakness (generalized)  Other lack of coordination  Rationale for Evaluation and Treatment: Rehabilitation  ONSET DATE: few years- pt unable to tell exact date  SUBJECTIVE:                                                                                                                                                                                           SUBJECTIVE STATEMENT: Pt reports that she has not had sparkling water in 2 weeks and it has made a huge difference in the pressure. Thinks she is going to stick with it for the most part. Has not done her abdominal massage, feels ok.  Urgency and frequency better       Fluid intake: coffee and water  PAIN:  Are you having pain? Yes- low back and hips radiating to right LE NPRS scale: 5/10 Pain location:  low back   Pain type: aching Pain description: dull   Aggravating factors: bending forward Relieving factors: rest  PRECAUTIONS: None  RED FLAGS: None   WEIGHT BEARING RESTRICTIONS: No  FALLS:  Has patient fallen in last 6 months? No  OCCUPATION: teacher  ACTIVITY LEVEL : walks a lot   PLOF: Independent  PATIENT GOALS: reduce constipation, reduce urgency and frequency, always have control of her bowel and urine  PERTINENT HISTORY:  Crohn's Sexual abuse: No  BOWEL MOVEMENT: Pain with bowel movement: No Type of bowel movement:Type (Bristol Stool Scale) 1-2 Fully empty rectum: No Leakage: Yes: - when she passes gas   at times Pads: No Fiber supplement/laxative No  URINATION: Pain with urination: No Fully empty bladder: No Stream: Weak Urgency: Yes  Frequency: depends- every 30 mins-hr at home,  Leakage: Sneezing and Exercise-  jumping Pads: Yes: pantyliner for hemorrhoids  INTERCOURSE:  Ability to have vaginal penetration Yes  Pain with intercourse: Initial Penetration, During Penetration, and Deep Penetration- vaginal inserts have helped DrynessYes  Climax: yes Marinoff Scale: 1/3 Laxative:  PREGNANCY: Vaginal deliveries 2- 25and 22 Tearing Yes:   Episiotomy  No C-section deliveries 0 Currently pregnant No  PROLAPSE: None   OBJECTIVE:  Note: Objective measures were completed at Evaluation unless otherwise noted.    PFIQ-7: 88  COGNITION: Overall cognitive status: Within functional limits for tasks assessed     SENSATION: Light touch: Appears intact  LUMBAR SPECIAL TESTS:  Straight leg raise test: Negative Bilateral hip and knee weakness in single leg squat   GAIT: Assistive device utilized: None Comments: within functional limitations   POSTURE: rounded shoulders and forward head   LUMBARAROM/PROM: slightly limited bending by pain today   LOWER EXTREMITY ZOX:WRUEA within functional limitations   LOWER EXTREMITY MMT: bilateral  hip and knee weakness present   General: able to dem diaphragmatic breathing   Pelvic Alignment: seems even  Abdominal: tenderness throughout                External Perineal Exam: seems within functional limitations                Internal Pelvic Floor: tight and tender bulbo more on left, tight and tender perineal scar, some dryness present Patient confirms identification and approves PT to assess internal pelvic floor and treatment Yes  PELVIC MMT:   MMT eval  Vaginal 2/5  Internal Anal Sphincter   External Anal Sphincter   Puborectalis   Diastasis Recti 1 finger  (Blank rows = not tested)        TONE: Good tone TRA in hooklying  PROLAPSE: None noticed in hook lying   TODAY'S TREATMENT:                                                                                                                              DATE: 04/04/24      Manual- abdominal massage  Neuro reed- bird dog with transverse abdominis breath                      Transverse abdominis breath with ball press  Modified her gym ab exercises to engage lower abdomen with improved pressure amangement Unilat ball press with transverse abdominis breath Side plank Modified "suitcase"                        HOME EXERCISE PROGRAM: [id number]  ASSESSMENT:  CLINICAL IMPRESSION: Pt did well with physical therapy and education, she is ready for discharge, her urinary urgency and frequency is much improved and she reported that her constipation is tolerable. She is more aware of her pelvic floor muscle gripping and does her exercises.   OBJECTIVE IMPAIRMENTS: decreased coordination, decreased ROM, decreased strength, increased fascial restrictions, improper body mechanics, and pain.   ACTIVITY LIMITATIONS: continence and toileting  PARTICIPATION LIMITATIONS: community activity and occupation  PERSONAL FACTORS: Time since onset of injury/illness/exacerbation are also affecting patient's functional outcome.   REHAB POTENTIAL: Good  CLINICAL DECISION MAKING: Evolving/moderate complexity  EVALUATION COMPLEXITY: Moderate   GOALS: Goals reviewed with patient?  Yes  SHORT TERM GOALS: Target date: 04/04/24   Pt will be independent with HEP.   Baseline: Goal status: met  2.  Pt will be independent with the knack, urge suppression technique, and double voiding in order to improve bladder habits and decrease urinary incontinence.   Baseline:  Goal status: met 3.  Pt to demonstrate improved coordination of pelvic floor and breathing mechanics with 10# squat with appropriate synergistic patterns to decrease pain and leakage at least 75% of the time.    Baseline:  Goal status: INITIAL  4.  Pt will be independent with use of squatty potty, relaxed toileting mechanics, and improved bowel movement techniques in order to increase ease of bowel movements  and complete evacuation.   Baseline:  Goal status: discharged    LONG TERM GOALS: Target date: 08/15/2024  Pt will soak 0 pads/ day Baseline:  Goal status: discharged  2.  Pt will report complete emptying Baseline:  Goal status: discharged  3.  Pt will be I with advanced HEP Baseline:  Goal status: discharged   4.  Pt will report 0/10 pain with her HEP Baseline:  Goal status: met  5.  Pt will report 0 pain with intercourse Baseline:  Goal status: discharged  PLAN:  PT FREQUENCY: 1-2x/week  PT DURATION: 6 months  PLANNED INTERVENTIONS: 97110-Therapeutic exercises, 97530- Therapeutic activity, 97112- Neuromuscular re-education, 97535- Self Care, 16109- Manual therapy, [phone]- Electrical stimulation (manual), Dry Needling, Joint mobilization, Joint manipulation, Spinal manipulation, Spinal mobilization, Scar mobilization, and Biofeedback  PLAN FOR NEXT SESSION:  discharge PT   Valerio Pinard, PT 04/04/2024, 3:33 PM   PHYSICAL THERAPY DISCHARGE SUMMARY  Visits from Start of Care: 4   Patient agrees to discharge. Patient goals were partially met. Patient is being discharged due to being pleased with the current functional level.

## 2024-04-14 ENCOUNTER — Encounter: Payer: Self-pay | Admitting: Internal Medicine

## 2024-04-14 ENCOUNTER — Other Ambulatory Visit: Payer: Self-pay | Admitting: Internal Medicine

## 2024-04-14 DIAGNOSIS — R7989 Other specified abnormal findings of blood chemistry: Secondary | ICD-10-CM

## 2024-04-18 ENCOUNTER — Other Ambulatory Visit (INDEPENDENT_AMBULATORY_CARE_PROVIDER_SITE_OTHER)

## 2024-04-18 DIAGNOSIS — R7989 Other specified abnormal findings of blood chemistry: Secondary | ICD-10-CM

## 2024-04-18 DIAGNOSIS — E785 Hyperlipidemia, unspecified: Secondary | ICD-10-CM

## 2024-04-18 LAB — GAMMA GT: GGT: 13 U/L (ref 7–51)

## 2024-04-19 LAB — NMR, LIPOPROFILE
Cholesterol, Total: 171 mg/dL (ref 100–199)
HDL Particle Number: 35.7 umol/L (ref >=30.5)
HDL-C: 84 mg/dL (ref >39)
LDL Particle Number: 615 nmol/L (ref <1000)
LDL Size: 21.1 nm (ref >20.5)
LDL-C (NIH Calc): 73 mg/dL (ref 0–99)
LP-IR Score: 26 (ref <=45)
Small LDL Particle Number: <90 nmol/L (ref <=527)
Triglycerides: 73 mg/dL (ref 0–149)

## 2024-04-21 ENCOUNTER — Other Ambulatory Visit: Payer: Self-pay | Admitting: Internal Medicine

## 2024-04-21 ENCOUNTER — Encounter: Payer: Self-pay | Admitting: Internal Medicine

## 2024-04-21 DIAGNOSIS — R748 Abnormal levels of other serum enzymes: Secondary | ICD-10-CM

## 2024-04-21 DIAGNOSIS — R7989 Other specified abnormal findings of blood chemistry: Secondary | ICD-10-CM

## 2024-04-21 LAB — ALPHA-1-ANTITRYPSIN: A-1 Antitrypsin, Ser: 140 mg/dL (ref 83–199)

## 2024-04-21 LAB — ANTI-NUCLEAR AB-TITER (ANA TITER)
ANA TITER: 1:80 {titer} — ABNORMAL HIGH
ANA Titer 1: 1:320 {titer} — ABNORMAL HIGH

## 2024-04-21 LAB — IGG: IgG (Immunoglobin G), Serum: 1502 mg/dL (ref 600–1640)

## 2024-04-21 LAB — MITOCHONDRIAL ANTIBODIES: Mitochondrial M2 Ab, IgG: <=20 U (ref <=20.0)

## 2024-04-21 LAB — HEPATITIS C ANTIBODY: Hepatitis C Ab: NONREACTIVE

## 2024-04-21 LAB — NUCLEOTIDASE, 5', BLOOD: 5-Nucleotidase: 3 U/L (ref 0–10)

## 2024-04-21 LAB — ANA: Anti Nuclear Antibody (ANA): POSITIVE — AB

## 2024-04-21 LAB — ANTI-SMOOTH MUSCLE ANTIBODY, IGG: Actin (Smooth Muscle) Antibody (IGG): <20 U (ref <20)

## 2024-04-21 LAB — HEPATITIS B SURFACE ANTIGEN: Hepatitis B Surface Ag: NONREACTIVE

## 2024-04-21 LAB — HEPATITIS B CORE ANTIBODY, TOTAL: Hep B Core Total Ab: NONREACTIVE

## 2024-06-17 ENCOUNTER — Ambulatory Visit

## 2024-06-17 ENCOUNTER — Other Ambulatory Visit: Payer: Self-pay | Admitting: Internal Medicine

## 2024-06-17 ENCOUNTER — Ambulatory Visit: Payer: Self-pay | Admitting: Internal Medicine

## 2024-06-17 ENCOUNTER — Other Ambulatory Visit (INDEPENDENT_AMBULATORY_CARE_PROVIDER_SITE_OTHER)

## 2024-06-17 DIAGNOSIS — R7989 Other specified abnormal findings of blood chemistry: Secondary | ICD-10-CM | POA: Diagnosis not present

## 2024-06-17 DIAGNOSIS — R748 Abnormal levels of other serum enzymes: Secondary | ICD-10-CM | POA: Diagnosis not present

## 2024-06-17 LAB — HEPATIC FUNCTION PANEL
ALT: 95 U/L — ABNORMAL HIGH (ref 0–35)
AST: 73 U/L — ABNORMAL HIGH (ref 0–37)
Albumin: 4.4 g/dL (ref 3.5–5.2)
Alkaline Phosphatase: 89 U/L (ref 39–117)
Bilirubin, Direct: 0.2 mg/dL (ref 0.0–0.3)
Total Bilirubin: 1.2 mg/dL (ref 0.2–1.2)
Total Protein: 7.6 g/dL (ref 6.0–8.3)

## 2024-06-17 LAB — GAMMA GT: GGT: 13 U/L (ref 7–51)

## 2024-06-17 LAB — CK: Total CK: 85 U/L (ref 7–177)

## 2024-06-19 LAB — NUCLEOTIDASE, 5', BLOOD: 5-Nucleotidase: 3 U/L (ref 0–10)

## 2024-07-03 ENCOUNTER — Encounter: Payer: Self-pay | Admitting: Internal Medicine

## 2024-07-03 ENCOUNTER — Other Ambulatory Visit: Payer: Self-pay | Admitting: Internal Medicine

## 2024-07-03 DIAGNOSIS — R7989 Other specified abnormal findings of blood chemistry: Secondary | ICD-10-CM

## 2024-07-03 DIAGNOSIS — R748 Abnormal levels of other serum enzymes: Secondary | ICD-10-CM | POA: Insufficient documentation

## 2024-07-21 ENCOUNTER — Other Ambulatory Visit: Payer: Self-pay | Admitting: *Deleted

## 2024-07-21 MED ORDER — NURTEC 75 MG PO TBDP: 75.0000 mg | ORAL_TABLET | ORAL | 6 refills | Status: DC

## 2024-07-22 ENCOUNTER — Encounter: Payer: Self-pay | Admitting: Physician Assistant

## 2024-07-22 ENCOUNTER — Ambulatory Visit: Admitting: Physician Assistant

## 2024-07-22 VITALS — BP 107/71 | HR 75 | Wt 134.0 lb

## 2024-07-22 DIAGNOSIS — G43709 Chronic migraine without aura, not intractable, without status migrainosus: Secondary | ICD-10-CM

## 2024-07-22 DIAGNOSIS — M62838 Other muscle spasm: Secondary | ICD-10-CM | POA: Diagnosis not present

## 2024-07-22 MED ORDER — NURTEC 75 MG PO TBDP: 75.0000 mg | ORAL_TABLET | ORAL | 11 refills | Status: AC

## 2024-07-22 MED ORDER — ONABOTULINUMTOXINA 200 UNITS IJ SOLR
200.0000 [IU] | Freq: Once | INTRAMUSCULAR | Status: AC
  Administered 2024-07-22: 200 [IU] via INTRAMUSCULAR

## 2024-07-22 NOTE — Patient Instructions (Signed)
Botulinum Toxin Cosmetic Injection Botulinum toxin injection is a shot in the skin that is given to soften lines and wrinkles in the face. The medicine works by weakening the muscles in the face, which reduces the wrinkles when you move your face. Tell a doctor about: Any allergies you have, especially if you are allergic to eggs or milk. All the medicines you take. This includes: Antibiotics. Vitamins. Herbs. Eye drops. Creams. Over-the-counter medicines. Any bleeding problems you have. Any surgeries you have had. Any medical conditions you have. Whether you are pregnant or may be pregnant. Whether you are breastfeeding. What are the risks? Your doctor will talk with you about risks. These may include: Pain in the jaw. Pain in the neck. Allergic reaction to the shot. Infection. Drooping eyebrow or eyelid. Not being able to close one or both eyes. Double vision. Blurry vision. Changes in voice or speech. Trouble swallowing. What happens before the procedure? Ask your doctor about changing or stopping: Your normal medicines. Vitamins, herbs, and supplements. Over-the-counter medicines. Do not take aspirin or ibuprofen unless you are told to. Do not drink alcohol if your doctor tells you not to drink. What happens during the procedure?  The area to be treated may be: Chilled with ice. Numbed with medicine. Your doctor will give you some shots of the toxin. How many shots you get depends on: Your condition. Which area is being treated. The procedure may vary among doctors and hospitals. What can I expect after the procedure? After these shots, it is common to have: Pain, soreness, swelling, itching, or redness on your face. Small bruises from the needle. Bumps or marks from the needle. Loss of feeling in areas where the needle entered the body. Headache. This is rare. It may take up to 14 days for the treatment to work. It may work sooner for some people. Results will  last for about 3-4 months. Follow-up treatment will make this last longer. Follow these instructions at home: Managing pain and discomfort  Take over-the-counter and prescription medicines only as told by your doctor. If told, put ice on the affected area: Put ice in a plastic bag. Place a towel between your skin and the bag. Leave the ice on for 20 minutes, 2-3 times per day. If your skin turns bright red, take off the ice right away to prevent skin damage. The risk of damage is higher if you cannot feel pain, heat, or cold. Activity Do not do activities that take a lot of effort. Avoid these activities for 12 hours after the procedure or for as long as told by your doctor. This includes: Not lifting heavy items. Not working out. Not doing activities that make your heart beat faster. General instructions Do not lie down for 4 hours after treatment or as told by your doctor. Do not rub the area where you got the shot. This can spread the toxin. Depending on where the shot was given: Your doctor may ask you not to move your muscles in that area. Your doctor may tell you to frown or squint regularly. You may need to do these exercises every 15 minutes for 1 hour after the shots. Follow what the doctor tells you. Do not get laser treatments, facials, or facial massages for 1-2 weeks after the procedure or for as long as told by your doctor. Contact a doctor if: You have a headache that gets worse. You have a fever. Your eyelids start to get droopy or swollen. You have trouble  speaking. You have trouble swallowing. Get help right away if: You have signs of an allergic reaction. These include: An itchy rash or welts. Wheezing. You feel short of breath or have trouble breathing. Dizziness. These symptoms may be an emergency. Get help right away. Call 911. Do not wait to see if the symptoms will go away. Do not drive yourself to the hospital. This information is not intended to replace  advice given to you by your health care provider. Make sure you discuss any questions you have with your health care provider. Document Revised: 08/25/2022 Document Reviewed: 08/25/2022 Elsevier Patient Education  2024 [employer].

## 2024-07-22 NOTE — Progress Notes (Signed)
 S: Pt in office today for Botox  injections. Her Botox  has been working very well for migraine prevention along with Nurtec.     O: BP 107/71   Pulse 75   Wt 134 lb (60.8 kg)   BMI 24.51 kg/m    Botox  Procedure Note Vial of Botox  was Supplied by Patient   Botox  Dosing by [facility] for Chronic Migraine   Injection Sites for Migraines  Botox  155 units was injected using the dosage in the table above in the pattern shown above.45 units wasted.  A: Migraine  Muscle spasm   P: Botox  155 units injected today.  Nurtec refilled as requested. RTC 3 Months.

## 2024-08-19 ENCOUNTER — Other Ambulatory Visit (INDEPENDENT_AMBULATORY_CARE_PROVIDER_SITE_OTHER)

## 2024-08-19 DIAGNOSIS — R7989 Other specified abnormal findings of blood chemistry: Secondary | ICD-10-CM | POA: Diagnosis not present

## 2024-08-19 LAB — HEPATIC FUNCTION PANEL
ALT: 89 U/L — ABNORMAL HIGH (ref 0–35)
AST: 63 U/L — ABNORMAL HIGH (ref 0–37)
Albumin: 4.5 g/dL (ref 3.5–5.2)
Alkaline Phosphatase: 95 U/L (ref 39–117)
Bilirubin, Direct: 0.2 mg/dL (ref 0.0–0.3)
Total Bilirubin: 1.4 mg/dL — ABNORMAL HIGH (ref 0.2–1.2)
Total Protein: 7.8 g/dL (ref 6.0–8.3)

## 2024-08-20 ENCOUNTER — Ambulatory Visit: Payer: Self-pay | Admitting: Internal Medicine

## 2024-08-26 ENCOUNTER — Other Ambulatory Visit: Payer: Self-pay

## 2024-08-26 ENCOUNTER — Encounter (HOSPITAL_BASED_OUTPATIENT_CLINIC_OR_DEPARTMENT_OTHER): Payer: Self-pay | Admitting: Physical Therapy

## 2024-08-26 ENCOUNTER — Ambulatory Visit (HOSPITAL_BASED_OUTPATIENT_CLINIC_OR_DEPARTMENT_OTHER): Attending: Physician Assistant | Admitting: Physical Therapy

## 2024-08-26 DIAGNOSIS — R293 Abnormal posture: Secondary | ICD-10-CM | POA: Insufficient documentation

## 2024-08-26 DIAGNOSIS — M62838 Other muscle spasm: Secondary | ICD-10-CM | POA: Insufficient documentation

## 2024-08-26 NOTE — Therapy (Signed)
 OUTPATIENT PHYSICAL THERAPY CERVICAL EVALUATION   Patient Name: Shelly Wilkinson MRN: [id number] DOB:1971/10/19, 53 y.o., female Today's Date: 08/26/2024  END OF SESSION:  PT End of Session - 08/26/24 1600     Visit Number 1    Number of Visits 16    Date for PT Re-Evaluation 10/21/24    PT Start Time 1300    PT Stop Time 1343    PT Time Calculation (min) 43 min    Activity Tolerance Patient tolerated treatment well    Behavior During Therapy [facility] for tasks assessed/performed          Past Medical History:  Diagnosis Date   Allergy    Anemia, iron deficiency 08/25/2016   Anxiety    Arthritis    Choroidal nevus    Chronic anal fissure    Complication of anesthesia    headaches/   06-22-2014 surg. had urinary retention   Constipation due to outlet dysfunction    Dyssynergic constipation 10/01/2016   Anorectal mano   Episcleritis-uveitis? 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   Left sided ulcerative colitis (? Crohn's) with complication (HCC) 11/18/2016   Diagnosed fall 2017. Indeterminate probably. Rectal involvement but patchy small ulcers. Initially chewed with Lialda  and uceris  was added. Changed to Humira  May 2018. Prednisone  started April 2018. Improved.     07/2022 Yusimry  - biosimilar started   Macular degeneration    Migraines    Thrombosed hemorrhoids    Vitamin D  deficiency 11/19/2016   Wears glasses    Past Surgical History:  Procedure Laterality Date   ANAL RECTAL MANOMETRY N/A 09/17/2016   Procedure: ANO RECTAL MANOMETRY;  Surgeon: Lupita FORBES Commander, MD;  Location: [facility];  Service: Endoscopy;  Laterality: N/A;   CHEILECTOMY Right 12/15/2013   Procedure: RIGHT HALLUX CHEILECTOMY;  Surgeon: Norleen Armor, MD;  Location: [facility];   Service: Orthopedics;  Laterality: Right;   CHEILECTOMY Left 02-12-2006   great toe   COLONOSCOPY  10-02-2010   CYSTO/  HYDRODISTENTION/  INSTILLATION THERAPY  03-19-2010   D & C HYSTEROSCOPY /  HYDROTHERMAL ENDOMETRIAL ABLATION/  LEEP  05-29-2011   DILATION AND EVACUATION  2002   ESOPHAGOGASTRODUODENOSCOPY (EGD) WITH PROPOFOL  N/A 04/17/2022   Procedure: ESOPHAGOGASTRODUODENOSCOPY (EGD) WITH PROPOFOL ;  Surgeon: Teressa Toribio SQUIBB, MD;  Location: [facility];  Service: Gastroenterology;  Laterality: N/A;   EUS N/A 04/17/2022   Procedure: UPPER ENDOSCOPIC ULTRASOUND (EUS) RADIAL;  Surgeon: Teressa Toribio SQUIBB, MD;  Location: [facility];  Service: Gastroenterology;  Laterality: N/A;   EVALUATION UNDER ANESTHESIA WITH ANAL FISTULECTOMY N/A 12/26/2014   Procedure: EXAM UNDER ANESTHESIA ;  Surgeon: Bernarda Ned, MD;  Location: [facility];  Service: General;  Laterality: N/A;   HEMORRHOID BANDING  02-16-2014   REPAIR LACERATED RADIAL DIGITAL NERVE LEFT RING FINGER  08-25-2005   SPHINCTEROTOMY N/A 12/26/2014   Procedure: CHEMICAL SPHINCTEROTOMY (BOTOX );  Surgeon: Bernarda Ned, MD;  Location: [facility];  Service: General;  Laterality: N/A;   TRANSANAL HEMORRHOIDAL DEARTERIALIZATION  06-22-2014   Patient Active Problem List   Diagnosis Date Noted   Abnormal transaminases 07/03/2024   Osteopenia after menopause 12/17/2023   Chronic migraine without aura without status migrainosus, not intractable 09/12/2022   Abnormal CT scan, stomach  Hyperinsulinemic hypoglycemia 02/18/2022   Hypoglycemia 12/10/2021   Muscle spasm 11/30/2020   Abnormal mammogram of right breast 05/03/2018   Long-term use of immunosuppressant medication - Humira  06/16/2017   Vitamin D  deficiency 11/19/2016   Left sided ulcerative colitis (? Crohn's) with complication (HCC) 11/18/2016   Anemia, iron deficiency 08/25/2016   Prolapsed internal hemorrhoids, grade 3 02/27/2016   Interstitial  cystitis 10/19/2012   Migraine headache 10/19/2012    PCP: Trula Brim MD  REFERRING PROVIDER: Latrelle Holt PA  REFERRING DIAG:  TMJ  THERAPY DIAG:  No diagnosis found.  Rationale for Evaluation and Treatment: Rehabilitation  ONSET DATE:    SUBJECTIVE:                                                                                                                                                                                                         SUBJECTIVE STATEMENT: The patient has long history of jaw pain.  She has increased pain when chewing and opening.  She also clenches her teeth at night.  She has a bite guard.  She also has a long history of migraines.  She gets Botox  in her forehead.  Hand dominance: Right  PERTINENT HISTORY:  Anxiety: Arthritis in the feet: Migraines: Macular degeneration:  PAIN:  Are you having pain? Yes: NPRS scale: 5/10 7/20 at worst Pain location: Bilateral jaw Pain description: aching  Aggravating factors: chewing  Relieving factors: ice, massage   PRECAUTIONS: None  RED FLAGS: None     WEIGHT BEARING RESTRICTIONS: No  FALLS:  Has patient fallen in last 6 months? No  LIVING ENVIRONMENT: Nothing pertinent  OCCUPATION:  Teacher   PLOF: Independent  PATIENT GOALS:    NEXT MD VISIT: Nothing scheduled  OBJECTIVE:  Note: Objective measures were completed at Evaluation unless otherwise noted.  DIAGNOSTIC FINDINGS:  Nothing of the jaw  PATIENT SURVEYS:  TMJ outcome measure   COGNITION: Overall cognitive status: Within functional limits for tasks assessed  SENSATION: WNL  POSTURE: forward head  PALPATION: Significant tightness in upper traps and cervical spine    CERVICAL ROM:   Active ROM A/PROM (deg) eval  Flexion WNL  Extension WNL  Right lateral flexion   Left lateral flexion   Right rotation 60  Left rotation 60   (Blank rows = not tested)  UPPER EXTREMITY ROM: Full active range of motion  of shoulders   Patient has opening and closing shift to the right greater than 1 cm shift noted  Jaw opening 5.3 cm with pain at end range  TREATMENT DATE:   Rocccabado exercises  Tongue clicking x 6 Controlled opening x 6 Chin tucks x 6 Isometric deviation times five 5-second holds Isometric opening 5 x 5-second holds Row 2 x 10 green  Manual: Trigger point release to bilateral upper traps, cervical paraspinals, and suboccipitals Review of use of Thera cane to suboccipitals and upper traps                                                                                                                               PATIENT EDUCATION:  Education details: HEP, symptom management  Person educated: Patient Education method: Explanation, Demonstration, Tactile cues, Verbal cues, and Handouts Education comprehension: verbalized understanding, returned demonstration, verbal cues required, tactile cues required, and needs further education  HOME EXERCISE PROGRAM: Access Code: [email or web address] URL: [email or web address] Date: 08/26/2024 Prepared by: Alm Don  Exercises - Tongue Clicks TMJ  - 3 x daily - 7 x weekly - 1 sets - 6 reps - Isometric Jaw Deviation  - 3 x daily - 7 x weekly - 1 sets - 10 reps - Isometric Jaw Abduction  - 3 x daily - 7 x weekly - 1 sets - 10 reps - Seated Cervical Retraction  - 3 x daily - 7 x weekly - 1 sets - 10 reps - Scapular Retraction with Resistance  - 3 x daily - 7 x weekly - 2 sets - 10 reps - Jaw Opening  - 3 x daily - 7 x weekly - 3 sets - 10 reps  ASSESSMENT:  CLINICAL IMPRESSION: Patient is a 53 year old female with long history of bilateral jaw pain.  She has tenderness palpation in her masseter area bilateral upper traps, and cervical paraspinals on both sides.  She has a significant right shift with opening and closing.  She does have full opening but pain at end range end ranges.  She has increased muscle tension in her  upper traps and cervical paraspinals.  She would benefit from skilled therapy to improve her ability to eat and perform daily activities without pain OBJECTIVE IMPAIRMENTS: Difficulty chewing difficulty yawning goal, pain muscle tightness.   ACTIVITY LIMITATIONS: Eating, yawning  PARTICIPATION LIMITATIONS:  PERSONAL FACTORS: 1 comorbidity: Anxiety, migraines are also affecting patient's functional outcome.   REHAB POTENTIAL: Good  CLINICAL DECISION MAKING: Stable/uncomplicated  EVALUATION COMPLEXITY: Low   GOALS: Goals reviewed with patient? Yes  SHORT TERM GOALS: Target date: 09/23/2024    Patient will demonstrate full eye opening without bilateral jaw pain Baseline:  Goal status: INITIAL  2.  Patient will show no deviation with opening and closing Baseline:  Goal status: INITIAL  3.  Patient will be independent with self soft tissue mobilization Baseline:  Goal status: INITIAL  4.  Patient will perform Rocco bottles with minimal cueing Baseline:  Goal status: INITIAL   LONG TERM GOALS: Target date: 10/21/2024    Patient will be independent with complete posterior chain strengthening HEP to prevent further  exacerbation Baseline:  Goal status: INITIAL  2.  Patient will chew food without pain Baseline:  Goal status: INITIAL  3.  Patient will have no pain carrying conversations Baseline:  Goal status: INITIAL    PLAN:  PT FREQUENCY: 2x/week  PT DURATION: 8 weeks  PLANNED INTERVENTIONS: 97110-Therapeutic exercises, 97530- Therapeutic activity, 97112- Neuromuscular re-education, 97535- Self Care, [id number]- Manual therapy, 97116- Gait training, c Therapy, 97014- Electrical stimulation (unattended), 97035- Ultrasound, Patient/Family education, Stair training, Taping, Dry Needling, DME instructions, Cryotherapy, and Moist heat   PLAN FOR NEXT SESSION: Trigger point dry needling to masseter and lateral pterygoid, trigger point dry needling to upper traps and  cervical paraspinals.  Soft tissue mobilization upper traps and cervical paraspinals as well as masseter.  Review rows and shoulder extensions.  Reviewed cervical retraction.  Review current HEP and troubleshoot.   Alm JINNY Don, PT 08/26/2024, 5:19 PM

## 2024-09-16 ENCOUNTER — Encounter (HOSPITAL_BASED_OUTPATIENT_CLINIC_OR_DEPARTMENT_OTHER): Payer: Self-pay | Admitting: Physical Therapy

## 2024-09-16 ENCOUNTER — Ambulatory Visit (HOSPITAL_BASED_OUTPATIENT_CLINIC_OR_DEPARTMENT_OTHER): Attending: Physician Assistant | Admitting: Physical Therapy

## 2024-09-16 DIAGNOSIS — R278 Other lack of coordination: Secondary | ICD-10-CM | POA: Insufficient documentation

## 2024-09-16 DIAGNOSIS — R293 Abnormal posture: Secondary | ICD-10-CM | POA: Diagnosis present

## 2024-09-16 DIAGNOSIS — M6281 Muscle weakness (generalized): Secondary | ICD-10-CM | POA: Insufficient documentation

## 2024-09-16 DIAGNOSIS — M62838 Other muscle spasm: Secondary | ICD-10-CM | POA: Insufficient documentation

## 2024-09-16 NOTE — Therapy (Signed)
 OUTPATIENT PHYSICAL THERAPY CERVICAL EVALUATION   Patient Name: Shelly Wilkinson MRN: [id number] DOB:May 28, 1971, 53 y.o., female Today's Date: 09/16/2024  END OF SESSION:  PT End of Session - 09/16/24 1447     Visit Number 2    Number of Visits 16    Date for Recertification  10/21/24    PT Start Time 1300    PT Stop Time 1342    PT Time Calculation (min) 42 min    Activity Tolerance Patient tolerated treatment well    Behavior During Therapy [facility] for tasks assessed/performed          Past Medical History:  Diagnosis Date   Allergy    Anemia, iron deficiency 08/25/2016   Anxiety    Arthritis    Choroidal nevus    Chronic anal fissure    Complication of anesthesia    headaches/   06-22-2014 surg. had urinary retention   Constipation due to outlet dysfunction    Dyssynergic constipation 10/01/2016   Anorectal mano   Episcleritis-uveitis? 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   Left sided ulcerative colitis (? Crohn's) with complication (HCC) 11/18/2016   Diagnosed fall 2017. Indeterminate probably. Rectal involvement but patchy small ulcers. Initially chewed with Lialda  and uceris  was added. Changed to Humira  May 2018. Prednisone  started April 2018. Improved.     07/2022 Yusimry  - biosimilar started   Macular degeneration    Migraines    Thrombosed hemorrhoids    Vitamin D  deficiency 11/19/2016   Wears glasses    Past Surgical History:  Procedure Laterality Date   ANAL RECTAL MANOMETRY N/A 09/17/2016   Procedure: ANO RECTAL MANOMETRY;  Surgeon: Lupita FORBES Commander, MD;  Location: [facility];  Service: Endoscopy;  Laterality: N/A;   CHEILECTOMY Right 12/15/2013   Procedure: RIGHT HALLUX CHEILECTOMY;  Surgeon: Norleen Armor, MD;  Location: [facility];   Service: Orthopedics;  Laterality: Right;   CHEILECTOMY Left 02-12-2006   great toe   COLONOSCOPY  10-02-2010   CYSTO/  HYDRODISTENTION/  INSTILLATION THERAPY  03-19-2010   D & C HYSTEROSCOPY /  HYDROTHERMAL ENDOMETRIAL ABLATION/  LEEP  05-29-2011   DILATION AND EVACUATION  2002   ESOPHAGOGASTRODUODENOSCOPY (EGD) WITH PROPOFOL  N/A 04/17/2022   Procedure: ESOPHAGOGASTRODUODENOSCOPY (EGD) WITH PROPOFOL ;  Surgeon: Teressa Toribio SQUIBB, MD;  Location: [facility];  Service: Gastroenterology;  Laterality: N/A;   EUS N/A 04/17/2022   Procedure: UPPER ENDOSCOPIC ULTRASOUND (EUS) RADIAL;  Surgeon: Teressa Toribio SQUIBB, MD;  Location: [facility];  Service: Gastroenterology;  Laterality: N/A;   EVALUATION UNDER ANESTHESIA WITH ANAL FISTULECTOMY N/A 12/26/2014   Procedure: EXAM UNDER ANESTHESIA ;  Surgeon: Bernarda Ned, MD;  Location: [facility];  Service: General;  Laterality: N/A;   HEMORRHOID BANDING  02-16-2014   REPAIR LACERATED RADIAL DIGITAL NERVE LEFT RING FINGER  08-25-2005   SPHINCTEROTOMY N/A 12/26/2014   Procedure: CHEMICAL SPHINCTEROTOMY (BOTOX );  Surgeon: Bernarda Ned, MD;  Location: [facility];  Service: General;  Laterality: N/A;   TRANSANAL HEMORRHOIDAL DEARTERIALIZATION  06-22-2014   Patient Active Problem List   Diagnosis Date Noted   Abnormal transaminases 07/03/2024   Osteopenia after menopause 12/17/2023   Chronic migraine without aura without status migrainosus, not intractable 09/12/2022   Abnormal CT scan, stomach  Hyperinsulinemic hypoglycemia 02/18/2022   Hypoglycemia 12/10/2021   Muscle spasm 11/30/2020   Abnormal mammogram of right breast 05/03/2018   Long-term use of immunosuppressant medication - Humira  06/16/2017   Vitamin D  deficiency 11/19/2016   Left sided ulcerative colitis (? Crohn's) with complication (HCC) 11/18/2016   Anemia, iron deficiency 08/25/2016   Prolapsed internal hemorrhoids, grade 3 02/27/2016   Interstitial  cystitis 10/19/2012   Migraine headache 10/19/2012    PCP: Trula Brim MD  REFERRING PROVIDER: Latrelle Holt PA  REFERRING DIAG:  TMJ  THERAPY DIAG:  Abnormal posture  Other muscle spasm  Muscle weakness (generalized)  Other lack of coordination  Rationale for Evaluation and Treatment: Rehabilitation  ONSET DATE:    SUBJECTIVE:                                                                                                                                                                                                         SUBJECTIVE STATEMENT: The patient reports it might not be as achy. She is interested in trying needling.    Eval: The patient has long history of jaw pain.  She has increased pain when chewing and opening.  She also clenches her teeth at night.  She has a bite guard.  She also has a long history of migraines.  She gets Botox  in her forehead.  Hand dominance: Right  PERTINENT HISTORY:  Anxiety: Arthritis in the feet: Migraines: Macular degeneration:  PAIN:  Are you having pain? Yes: NPRS scale: 5/10 7/20 at worst Pain location: Bilateral jaw Pain description: aching  Aggravating factors: chewing  Relieving factors: ice, massage   PRECAUTIONS: None  RED FLAGS: None     WEIGHT BEARING RESTRICTIONS: No  FALLS:  Has patient fallen in last 6 months? No  LIVING ENVIRONMENT: Nothing pertinent  OCCUPATION:  Teacher   PLOF: Independent  PATIENT GOALS:  To have less pain in her jaw  NEXT MD VISIT: Nothing scheduled  OBJECTIVE:  Note: Objective measures were completed at Evaluation unless otherwise noted.  DIAGNOSTIC FINDINGS:  Nothing of the jaw  PATIENT SURVEYS:  TMJ outcome measure   COGNITION: Overall cognitive status: Within functional limits for tasks assessed  SENSATION: WNL  POSTURE: forward head  PALPATION: Significant tightness in upper traps and cervical spine    CERVICAL ROM:   Active ROM A/PROM  (deg) eval  Flexion WNL  Extension WNL  Right lateral flexion   Left lateral flexion   Right rotation 60  Left rotation 60   (Blank rows = not tested)  UPPER EXTREMITY ROM:  Full active range of motion of shoulders   Patient has opening and closing shift to the right greater than 1 cm shift noted  Jaw opening 5.3 cm with pain at end range    TREATMENT DATE:  Trigger Point Dry Needling  Initial Treatment: Pt instructed on Dry Needling rational, procedures, and possible side effects. Pt instructed to expect mild to moderate muscle soreness later in the day and/or into the next day.  Pt instructed in methods to reduce muscle soreness. Pt instructed to continue prescribed HEP. Because Dry Needling was performed over or adjacent to a lung field, pt was educated on S/S of pneumothorax and to seek immediate medical attention should they occur.  Patient was educated on signs and symptoms of infection and other risk factors and advised to seek medical attention should they occur.  Patient verbalized understanding of these instructions and education.   Patient Verbal Consent Given: Yes Education Handout Provided: Yes Muscles Treated: masseter bilateral 2x each side upper trap 3x left 2x right .30x50  Electrical Stimulation Performed: No Treatment Response/Outcome: great twitch with all   Manual: Trigger point release to upper traps/suboccipitals/masseter/cervical paraspinals Skilled palpation of trigger points Review of self soft tissue mobilization     Neuro-re-ed:  Cable: Row 10 pounds RPE of 1 x 12 15 pounds RPE of six 2 x 12 Cable extension 10 pounds RPE of five 3 x 12  Reviewed how to use these exercises to prevent further exacerbations Eval:  Rocccabado exercises  Tongue clicking x 6 Controlled opening x 6 Chin tucks x 6 Isometric deviation times five 5-second holds Isometric opening 5 x 5-second holds Row 2 x 10 green  Manual: Trigger point release to  bilateral upper traps, cervical paraspinals, and suboccipitals Review of use of Thera cane to suboccipitals and upper traps                                                                                                                               PATIENT EDUCATION:  Education details: HEP, symptom management  Person educated: Patient Education method: Explanation, Demonstration, Tactile cues, Verbal cues, and Handouts Education comprehension: verbalized understanding, returned demonstration, verbal cues required, tactile cues required, and needs further education  HOME EXERCISE PROGRAM: Access Code: [email or web address] URL: [email or web address] Date: 08/26/2024 Prepared by: Alm Don  Exercises - Tongue Clicks TMJ  - 3 x daily - 7 x weekly - 1 sets - 6 reps - Isometric Jaw Deviation  - 3 x daily - 7 x weekly - 1 sets - 10 reps - Isometric Jaw Abduction  - 3 x daily - 7 x weekly - 1 sets - 10 reps - Seated Cervical Retraction  - 3 x daily - 7 x weekly - 1 sets - 10 reps - Scapular Retraction with Resistance  - 3 x daily - 7 x weekly - 2 sets - 10 reps - Jaw Opening  - 3 x daily - 7 x weekly -  3 sets - 10 reps  ASSESSMENT:  CLINICAL IMPRESSION: The patient had a great twitch in her masseter and upper trap. We also worked on manual therapy to both these areas to reduce post-needle soreness. We reviewed posterior chain exercises int he gym. We also looked at RPE and talked with the patient about how to grade her exercises.  Therapy will consider needling lateral pterygoid and cervical paraspinals next visit.  Eval: Patient is a 53 year old female with long history of bilateral jaw pain.  She has tenderness palpation in her masseter area bilateral upper traps, and cervical paraspinals on both sides.  She has a significant right shift with opening and closing.  She does have full opening but pain at end range end ranges.  She has increased muscle tension in her upper traps and  cervical paraspinals.  She would benefit from skilled therapy to improve her ability to eat and perform daily activities without pain OBJECTIVE IMPAIRMENTS: Difficulty chewing difficulty yawning goal, pain muscle tightness.   ACTIVITY LIMITATIONS: Eating, yawning  PARTICIPATION LIMITATIONS:  PERSONAL FACTORS: 1 comorbidity: Anxiety, migraines are also affecting patient's functional outcome.   REHAB POTENTIAL: Good  CLINICAL DECISION MAKING: Stable/uncomplicated  EVALUATION COMPLEXITY: Low   GOALS: Goals reviewed with patient? Yes  SHORT TERM GOALS: Target date: 09/23/2024    Patient will demonstrate full eye opening without bilateral jaw pain Baseline:  Goal status: INITIAL  2.  Patient will show no deviation with opening and closing Baseline:  Goal status: INITIAL  3.  Patient will be independent with self soft tissue mobilization Baseline:  Goal status: INITIAL  4.  Patient will perform Rocco bottles with minimal cueing Baseline:  Goal status: INITIAL   LONG TERM GOALS: Target date: 10/21/2024    Patient will be independent with complete posterior chain strengthening HEP to prevent further exacerbation Baseline:  Goal status: INITIAL  2.  Patient will chew food without pain Baseline:  Goal status: INITIAL  3.  Patient will have no pain carrying conversations Baseline:  Goal status: INITIAL    PLAN:  PT FREQUENCY: 2x/week  PT DURATION: 8 weeks  PLANNED INTERVENTIONS: 97110-Therapeutic exercises, 97530- Therapeutic activity, 97112- Neuromuscular re-education, 97535- Self Care, 02859- Manual therapy, 97116- Gait training, c Therapy, 97014- Electrical stimulation (unattended), 97035- Ultrasound, Patient/Family education, Stair training, Taping, Dry Needling, DME instructions, Cryotherapy, and Moist heat   PLAN FOR NEXT SESSION: Trigger point dry needling to masseter and lateral pterygoid, trigger point dry needling to upper traps and cervical  paraspinals.  Soft tissue mobilization upper traps and cervical paraspinals as well as masseter.  Review rows and shoulder extensions.  Reviewed cervical retraction.  Review current HEP and troubleshoot.   Alm JINNY Don, PT 09/16/2024, 2:51 PM

## 2024-09-22 ENCOUNTER — Ambulatory Visit (HOSPITAL_BASED_OUTPATIENT_CLINIC_OR_DEPARTMENT_OTHER): Admitting: Physical Therapy

## 2024-09-24 ENCOUNTER — Encounter: Payer: Self-pay | Admitting: Obstetrics & Gynecology

## 2024-09-28 ENCOUNTER — Ambulatory Visit (HOSPITAL_BASED_OUTPATIENT_CLINIC_OR_DEPARTMENT_OTHER): Attending: Physician Assistant | Admitting: Physical Therapy

## 2024-09-28 ENCOUNTER — Encounter (HOSPITAL_BASED_OUTPATIENT_CLINIC_OR_DEPARTMENT_OTHER): Payer: Self-pay | Admitting: Physical Therapy

## 2024-09-28 DIAGNOSIS — M62838 Other muscle spasm: Secondary | ICD-10-CM | POA: Diagnosis present

## 2024-09-28 DIAGNOSIS — R278 Other lack of coordination: Secondary | ICD-10-CM | POA: Diagnosis present

## 2024-09-28 DIAGNOSIS — R293 Abnormal posture: Secondary | ICD-10-CM | POA: Insufficient documentation

## 2024-09-28 DIAGNOSIS — M6281 Muscle weakness (generalized): Secondary | ICD-10-CM | POA: Diagnosis present

## 2024-09-28 NOTE — Therapy (Signed)
 OUTPATIENT PHYSICAL THERAPY CERVICAL EVALUATION   Patient Name: Shelly Wilkinson MRN: [id number] DOB:04-18-1971, 53 y.o., female Today's Date: 09/28/2024  END OF SESSION:  PT End of Session - 09/28/24 1003     Visit Number 3    Number of Visits 16    Date for Recertification  10/21/24    Activity Tolerance Patient tolerated treatment well    Behavior During Therapy [facility] for tasks assessed/performed          Past Medical History:  Diagnosis Date   Allergy    Anemia, iron deficiency 08/25/2016   Anxiety    Arthritis    Choroidal nevus    Chronic anal fissure    Complication of anesthesia    headaches/   06-22-2014 surg. had urinary retention   Constipation due to outlet dysfunction    Dyssynergic constipation 10/01/2016   Anorectal mano   Episcleritis-uveitis? 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   Left sided ulcerative colitis (? Crohn's) with complication (HCC) 11/18/2016   Diagnosed fall 2017. Indeterminate probably. Rectal involvement but patchy small ulcers. Initially chewed with Lialda  and uceris  was added. Changed to Humira  May 2018. Prednisone  started April 2018. Improved.     07/2022 Yusimry  - biosimilar started   Macular degeneration    Migraines    Thrombosed hemorrhoids    Vitamin D  deficiency 11/19/2016   Wears glasses    Past Surgical History:  Procedure Laterality Date   ANAL RECTAL MANOMETRY N/A 09/17/2016   Procedure: ANO RECTAL MANOMETRY;  Surgeon: Lupita FORBES Commander, MD;  Location: [facility];  Service: Endoscopy;  Laterality: N/A;   CHEILECTOMY Right 12/15/2013   Procedure: RIGHT HALLUX CHEILECTOMY;  Surgeon: Norleen Armor, MD;  Location: [facility];  Service: Orthopedics;  Laterality: Right;   CHEILECTOMY Left 02-12-2006   great  toe   COLONOSCOPY  10-02-2010   CYSTO/  HYDRODISTENTION/  INSTILLATION THERAPY  03-19-2010   D & C HYSTEROSCOPY /  HYDROTHERMAL ENDOMETRIAL ABLATION/  LEEP  05-29-2011   DILATION AND EVACUATION  2002   ESOPHAGOGASTRODUODENOSCOPY (EGD) WITH PROPOFOL  N/A 04/17/2022   Procedure: ESOPHAGOGASTRODUODENOSCOPY (EGD) WITH PROPOFOL ;  Surgeon: Teressa Toribio SQUIBB, MD;  Location: [facility];  Service: Gastroenterology;  Laterality: N/A;   EUS N/A 04/17/2022   Procedure: UPPER ENDOSCOPIC ULTRASOUND (EUS) RADIAL;  Surgeon: Teressa Toribio SQUIBB, MD;  Location: [facility];  Service: Gastroenterology;  Laterality: N/A;   EVALUATION UNDER ANESTHESIA WITH ANAL FISTULECTOMY N/A 12/26/2014   Procedure: EXAM UNDER ANESTHESIA ;  Surgeon: Bernarda Ned, MD;  Location: [facility];  Service: General;  Laterality: N/A;   HEMORRHOID BANDING  02-16-2014   REPAIR LACERATED RADIAL DIGITAL NERVE LEFT RING FINGER  08-25-2005   SPHINCTEROTOMY N/A 12/26/2014   Procedure: CHEMICAL SPHINCTEROTOMY (BOTOX );  Surgeon: Bernarda Ned, MD;  Location: [facility];  Service: General;  Laterality: N/A;   TRANSANAL HEMORRHOIDAL DEARTERIALIZATION  06-22-2014   Patient Active Problem List   Diagnosis Date Noted   Abnormal transaminases 07/03/2024   Osteopenia after menopause 12/17/2023   Chronic migraine without aura without status migrainosus, not intractable 09/12/2022   Abnormal CT scan, stomach    Hyperinsulinemic hypoglycemia 02/18/2022   Hypoglycemia 12/10/2021   Muscle spasm 11/30/2020   Abnormal mammogram of right breast 05/03/2018   Long-term  use of immunosuppressant medication - Humira  06/16/2017   Vitamin D  deficiency 11/19/2016   Left sided ulcerative colitis (? Crohn's) with complication (HCC) 11/18/2016   Anemia, iron deficiency 08/25/2016   Prolapsed internal hemorrhoids, grade 3 02/27/2016   Interstitial cystitis 10/19/2012   Migraine headache 10/19/2012    PCP: Trula Brim  MD  REFERRING PROVIDER: Latrelle Holt PA  REFERRING DIAG:  TMJ  THERAPY DIAG:  No diagnosis found.  Rationale for Evaluation and Treatment: Rehabilitation  ONSET DATE:    SUBJECTIVE:                                                                                                                                                                                                         SUBJECTIVE STATEMENT: The patient reports her jaw has felt better. She is having much less tension in her traps.    Eval: The patient has long history of jaw pain.  She has increased pain when chewing and opening.  She also clenches her teeth at night.  She has a bite guard.  She also has a long history of migraines.  She gets Botox  in her forehead.  Hand dominance: Right  PERTINENT HISTORY:  Anxiety: Arthritis in the feet: Migraines: Macular degeneration:  PAIN:  Are you having pain? Yes: NPRS scale: 5/10 7/20 at worst Pain location: Bilateral jaw Pain description: aching  Aggravating factors: chewing  Relieving factors: ice, massage   PRECAUTIONS: None  RED FLAGS: None     WEIGHT BEARING RESTRICTIONS: No  FALLS:  Has patient fallen in last 6 months? No  LIVING ENVIRONMENT: Nothing pertinent  OCCUPATION:  Teacher   PLOF: Independent  PATIENT GOALS:  To have less pain in her jaw  NEXT MD VISIT: Nothing scheduled  OBJECTIVE:  Note: Objective measures were completed at Evaluation unless otherwise noted.  DIAGNOSTIC FINDINGS:  Nothing of the jaw  PATIENT SURVEYS:  TMJ outcome measure   COGNITION: Overall cognitive status: Within functional limits for tasks assessed  SENSATION: WNL  POSTURE: forward head  PALPATION: Significant tightness in upper traps and cervical spine    CERVICAL ROM:   Active ROM A/PROM (deg) eval  Flexion WNL  Extension WNL  Right lateral flexion   Left lateral flexion   Right rotation 60  Left rotation 60   (Blank rows = not  tested)  UPPER EXTREMITY ROM: Full active range of motion of shoulders   Patient has opening and closing shift to the right greater than 1 cm shift noted  Jaw opening 5.3 cm with pain at end  range    TREATMENT DATE:  10/1   Trigger Point Dry Needling  Initial Treatment: Pt instructed on Dry Needling rational, procedures, and possible side effects. Pt instructed to expect mild to moderate muscle soreness later in the day and/or into the next day.  Pt instructed in methods to reduce muscle soreness. Pt instructed to continue prescribed HEP. Because Dry Needling was performed over or adjacent to a lung field, pt was educated on S/S of pneumothorax and to seek immediate medical attention should they occur.  Patient was educated on signs and symptoms of infection and other risk factors and advised to seek medical attention should they occur.  Patient verbalized understanding of these instructions and education.   Patient Verbal Consent Given: Yes Education Handout Provided: Yes Muscles Treated: masseter bilateral 2x each side upper trap 3x left 2x right .30x50 Lateral Ptyregoind 2x each side Electrical Stimulation Performed: No Treatment Response/Outcome: great twitch with all   Manual: Trigger point release to upper traps/suboccipitals/masseter/cervical paraspinals Skilled palpation of trigger points Review of self soft tissue mobilization Trigger point release to levator area      Last visit:  Neuro-re-ed:  Cable: Row 10 pounds RPE of 1 x 12 15 pounds RPE of six 2 x 12 Cable extension 10 pounds RPE of five 3 x 12  Reviewed how to use these exercises to prevent further exacerbations Eval:  Rocccabado exercises  Tongue clicking x 6 Controlled opening x 6 Chin tucks x 6 Isometric deviation times five 5-second holds Isometric opening 5 x 5-second holds Row 2 x 10 green  Manual: Trigger point release to bilateral upper traps, cervical paraspinals, and  suboccipitals Review of use of Thera cane to suboccipitals and upper traps                                                                                                                               PATIENT EDUCATION:  Education details: HEP, symptom management  Person educated: Patient Education method: Explanation, Demonstration, Tactile cues, Verbal cues, and Handouts Education comprehension: verbalized understanding, returned demonstration, verbal cues required, tactile cues required, and needs further education  HOME EXERCISE PROGRAM: Access Code: [email or web address] URL: [email or web address] Date: 08/26/2024 Prepared by: Alm Don  Exercises - Tongue Clicks TMJ  - 3 x daily - 7 x weekly - 1 sets - 6 reps - Isometric Jaw Deviation  - 3 x daily - 7 x weekly - 1 sets - 10 reps - Isometric Jaw Abduction  - 3 x daily - 7 x weekly - 1 sets - 10 reps - Seated Cervical Retraction  - 3 x daily - 7 x weekly - 1 sets - 10 reps - Scapular Retraction with Resistance  - 3 x daily - 7 x weekly - 2 sets - 10 reps - Jaw Opening  - 3 x daily - 7 x weekly - 3 sets - 10 reps  ASSESSMENT:  CLINICAL IMPRESSION: Therapy needled the patients lateral  Ptyeregoid today. She had a great twitch. We focused on manual therapy today to her upper traps and upper back. She will have a massage tomorrow. We will talk with massage therapist. She is progressing well> She was encouraged to continue with her exercises.   Eval: Patient is a 53 year old female with long history of bilateral jaw pain.  She has tenderness palpation in her masseter area bilateral upper traps, and cervical paraspinals on both sides.  She has a significant right shift with opening and closing.  She does have full opening but pain at end range end ranges.  She has increased muscle tension in her upper traps and cervical paraspinals.  She would benefit from skilled therapy to improve her ability to eat and perform daily activities  without pain OBJECTIVE IMPAIRMENTS: Difficulty chewing difficulty yawning goal, pain muscle tightness.   ACTIVITY LIMITATIONS: Eating, yawning  PARTICIPATION LIMITATIONS:  PERSONAL FACTORS: 1 comorbidity: Anxiety, migraines are also affecting patient's functional outcome.   REHAB POTENTIAL: Good  CLINICAL DECISION MAKING: Stable/uncomplicated  EVALUATION COMPLEXITY: Low   GOALS: Goals reviewed with patient? Yes  SHORT TERM GOALS: Target date: 09/23/2024    Patient will demonstrate full jaqopening without bilateral jaw pain Baseline:  Goal status:  improving 10/1  2.  Patient will show no deviation with opening and closing Baseline:  Goal status: improving 10/1  3.  Patient will be independent with self soft tissue mobilization Baseline:  Goal status: INITIAL  4.  Patient will perform Rocco bottles with minimal cueing Baseline:  Goal status: INITIAL   LONG TERM GOALS: Target date: 10/21/2024    Patient will be independent with complete posterior chain strengthening HEP to prevent further exacerbation Baseline:  Goal status: INITIAL  2.  Patient will chew food without pain Baseline:  Goal status: INITIAL  3.  Patient will have no pain carrying conversations Baseline:  Goal status: INITIAL    PLAN:  PT FREQUENCY: 2x/week  PT DURATION: 8 weeks  PLANNED INTERVENTIONS: 97110-Therapeutic exercises, 97530- Therapeutic activity, 97112- Neuromuscular re-education, 97535- Self Care, [id number]- Manual therapy, 97116- Gait training, c Therapy, 97014- Electrical stimulation (unattended), 97035- Ultrasound, Patient/Family education, Stair training, Taping, Dry Needling, DME instructions, Cryotherapy, and Moist heat   PLAN FOR NEXT SESSION: Trigger point dry needling to masseter and lateral pterygoid, trigger point dry needling to upper traps and cervical paraspinals.  Soft tissue mobilization upper traps and cervical paraspinals as well as masseter.  Review rows and  shoulder extensions.  Reviewed cervical retraction.  Review current HEP and troubleshoot.   Alm JINNY Don, PT 09/28/2024, 10:21 AM

## 2024-10-10 ENCOUNTER — Ambulatory Visit (INDEPENDENT_AMBULATORY_CARE_PROVIDER_SITE_OTHER): Admitting: Obstetrics & Gynecology

## 2024-10-10 ENCOUNTER — Encounter: Payer: Self-pay | Admitting: Obstetrics & Gynecology

## 2024-10-10 VITALS — BP 110/74 | HR 90 | Ht 62.0 in | Wt 130.0 lb

## 2024-10-10 DIAGNOSIS — H5709 Other anomalies of pupillary function: Secondary | ICD-10-CM

## 2024-10-10 DIAGNOSIS — N951 Menopausal and female climacteric states: Secondary | ICD-10-CM

## 2024-10-10 MED ORDER — ESTRADIOL 0.025 MG/24HR TD PTTW: 1.0000 | MEDICATED_PATCH | TRANSDERMAL | 12 refills | Status: AC

## 2024-10-10 MED ORDER — ESTRADIOL 10 MCG VA TABS
1.0000 | ORAL_TABLET | VAGINAL | 12 refills | Status: AC
Start: 2024-10-10 — End: ?

## 2024-10-10 MED ORDER — PROGESTERONE MICRONIZED 100 MG PO CAPS: 100.0000 mg | ORAL_CAPSULE | Freq: Every day | ORAL | 3 refills | Status: AC

## 2024-10-10 NOTE — Progress Notes (Unsigned)
   Subjective:    Patient ID: Shelly Wilkinson, female    DOB: October 22, 1971, 53 y.o.   MRN: [id number]  HPI  53 yo female with hot flashes, night sweats, brain fog, joint pain, low libido, weight gain.  She has been in menopause for several years and would like to consider HRT to see if these symptoms would improve.   Review of Systems  Constitutional:  Positive for fatigue and unexpected weight change.  Eyes:  Positive for visual disturbance.       Reports eyes are slow to equilibrate going from dark to light.    Respiratory: Negative.    Cardiovascular: Negative.   Gastrointestinal: Negative.   Endocrine: Positive for heat intolerance.  Genitourinary: Negative.        Objective:   Physical Exam Vitals reviewed.  Constitutional:      General: She is not in acute distress.    Appearance: She is well-developed.  HENT:     Head: Normocephalic and atraumatic.  Eyes:     Conjunctiva/sclera: Conjunctivae normal.     Comments: Pupils seem slightly enlarged and sluggish  Cardiovascular:     Rate and Rhythm: Normal rate.  Pulmonary:     Effort: Pulmonary effort is normal.  Skin:    General: Skin is warm and dry.  Neurological:     Mental Status: She is alert and oriented to person, place, and time.  Psychiatric:        Mood and Affect: Mood normal.    Vitals:   10/10/24 1340  BP: 110/74  Pulse: 90  Weight: 130 lb (59 kg)  Height: 5' 2 (1.575 m)       Assessment & Plan:  53 yo female in menopause with continued menopausal symptoms.   Patient with bothersome menopausal vasomotor symptoms. Discussed lifestyle interventions such as wearing light clothing, remaining in cool environments, having fan/air conditioner in the room, avoiding hot beverages etc.  Discussed using hormone therapy and concerns about increased risk of heart disease, cerebrovascular disease, thromboembolic disease,  and breast cancer.  Also discussed other medical options such as Paxil, Effexor or  Neurontin.   Also discussed alternative therapies such as herbal remedies but cautioned that most of the products contained phytoestrogens (plant estrogens) in unregulated amounts which can have the same effects on the body as the pharmaceutical estrogen preparations.  Also referred her to [email or web address].org for other alternative options.  Patient opted for Vivelle  estrogen therapy for now, Prometrium for progesterone.  RTC in 2 months.  Pt to f/u with optho for eye related issues.

## 2024-10-12 ENCOUNTER — Ambulatory Visit (HOSPITAL_BASED_OUTPATIENT_CLINIC_OR_DEPARTMENT_OTHER): Admitting: Physical Therapy

## 2024-10-12 DIAGNOSIS — R293 Abnormal posture: Secondary | ICD-10-CM

## 2024-10-12 DIAGNOSIS — M6281 Muscle weakness (generalized): Secondary | ICD-10-CM

## 2024-10-12 DIAGNOSIS — M62838 Other muscle spasm: Secondary | ICD-10-CM

## 2024-10-12 DIAGNOSIS — R278 Other lack of coordination: Secondary | ICD-10-CM

## 2024-10-12 NOTE — Therapy (Signed)
 OUTPATIENT PHYSICAL THERAPY CERVICAL EVALUATION   Patient Name: Shelly Wilkinson MRN: [id number] DOB:Apr 24, 1971, 53 y.o., female Today's Date: 10/12/2024  END OF SESSION:    Past Medical History:  Diagnosis Date   Allergy    Anemia, iron deficiency 08/25/2016   Anxiety    Arthritis    Choroidal nevus    Chronic anal fissure    Complication of anesthesia    headaches/   06-22-2014 surg. had urinary retention   Constipation due to outlet dysfunction    Dyssynergic constipation 10/01/2016   Anorectal mano   Episcleritis-uveitis? 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   Left sided ulcerative colitis (? Crohn's) with complication (HCC) 11/18/2016   Diagnosed fall 2017. Indeterminate probably. Rectal involvement but patchy small ulcers. Initially chewed with Lialda  and uceris  was added. Changed to Humira  May 2018. Prednisone  started April 2018. Improved.     07/2022 Yusimry  - biosimilar started   Macular degeneration    Migraines    Thrombosed hemorrhoids    Vitamin D  deficiency 11/19/2016   Wears glasses    Past Surgical History:  Procedure Laterality Date   ANAL RECTAL MANOMETRY N/A 09/17/2016   Procedure: ANO RECTAL MANOMETRY;  Surgeon: Lupita FORBES Commander, MD;  Location: [facility];  Service: Endoscopy;  Laterality: N/A;   CHEILECTOMY Right 12/15/2013   Procedure: RIGHT HALLUX CHEILECTOMY;  Surgeon: Norleen Armor, MD;  Location: [facility];  Service: Orthopedics;  Laterality: Right;   CHEILECTOMY Left 02-12-2006   great toe   COLONOSCOPY  10-02-2010   CYSTO/  HYDRODISTENTION/  INSTILLATION THERAPY  03-19-2010   D & C HYSTEROSCOPY /  HYDROTHERMAL ENDOMETRIAL ABLATION/  LEEP  05-29-2011   DILATION AND EVACUATION  2002   ESOPHAGOGASTRODUODENOSCOPY (EGD)  WITH PROPOFOL  N/A 04/17/2022   Procedure: ESOPHAGOGASTRODUODENOSCOPY (EGD) WITH PROPOFOL ;  Surgeon: Teressa Toribio SQUIBB, MD;  Location: [facility];  Service: Gastroenterology;  Laterality: N/A;   EUS N/A 04/17/2022   Procedure: UPPER ENDOSCOPIC ULTRASOUND (EUS) RADIAL;  Surgeon: Teressa Toribio SQUIBB, MD;  Location: [facility];  Service: Gastroenterology;  Laterality: N/A;   EVALUATION UNDER ANESTHESIA WITH ANAL FISTULECTOMY N/A 12/26/2014   Procedure: EXAM UNDER ANESTHESIA ;  Surgeon: Bernarda Ned, MD;  Location: [facility];  Service: General;  Laterality: N/A;   HEMORRHOID BANDING  02-16-2014   REPAIR LACERATED RADIAL DIGITAL NERVE LEFT RING FINGER  08-25-2005   SPHINCTEROTOMY N/A 12/26/2014   Procedure: CHEMICAL SPHINCTEROTOMY (BOTOX );  Surgeon: Bernarda Ned, MD;  Location: [facility];  Service: General;  Laterality: N/A;   TRANSANAL HEMORRHOIDAL DEARTERIALIZATION  06-22-2014   Patient Active Problem List   Diagnosis Date Noted   Abnormal transaminases 07/03/2024   Osteopenia after menopause 12/17/2023   Chronic migraine without aura without status migrainosus, not intractable 09/12/2022   Abnormal CT scan, stomach    Hyperinsulinemic hypoglycemia 02/18/2022   Hypoglycemia 12/10/2021   Muscle spasm 11/30/2020   Abnormal mammogram of right breast 05/03/2018   Long-term use of immunosuppressant medication - Humira  06/16/2017   Vitamin D  deficiency 11/19/2016   Left sided ulcerative colitis (? Crohn's) with complication (HCC) 11/18/2016   Anemia, iron deficiency 08/25/2016   Prolapsed internal hemorrhoids, grade 3 02/27/2016   Interstitial cystitis 10/19/2012   Migraine headache 10/19/2012    PCP: Trula Brim  MD  REFERRING PROVIDER: Latrelle Holt PA  REFERRING DIAG:  TMJ  THERAPY DIAG:  No diagnosis found.  Rationale for Evaluation and Treatment: Rehabilitation  ONSET DATE:    SUBJECTIVE:                                                                                                                                                                                                          SUBJECTIVE STATEMENT: The patient reports continued improvement. Her jaw is doing better. She has been going to the gym and advancing her exercises.   Eval: The patient has long history of jaw pain.  She has increased pain when chewing and opening.  She also clenches her teeth at night.  She has a bite guard.  She also has a long history of migraines.  She gets Botox  in her forehead.  Hand dominance: Right  PERTINENT HISTORY:  Anxiety: Arthritis in the feet: Migraines: Macular degeneration:  PAIN:  Are you having pain? Yes: NPRS scale: 5/10 7/20 at worst Pain location: Bilateral jaw Pain description: aching  Aggravating factors: chewing  Relieving factors: ice, massage   PRECAUTIONS: None  RED FLAGS: None     WEIGHT BEARING RESTRICTIONS: No  FALLS:  Has patient fallen in last 6 months? No  LIVING ENVIRONMENT: Nothing pertinent  OCCUPATION:  Teacher   PLOF: Independent  PATIENT GOALS:  To have less pain in her jaw  NEXT MD VISIT: Nothing scheduled  OBJECTIVE:  Note: Objective measures were completed at Evaluation unless otherwise noted.  DIAGNOSTIC FINDINGS:  Nothing of the jaw  PATIENT SURVEYS:  TMJ outcome measure   COGNITION: Overall cognitive status: Within functional limits for tasks assessed  SENSATION: WNL  POSTURE: forward head  PALPATION: Significant tightness in upper traps and cervical spine    CERVICAL ROM:   Active ROM A/PROM (deg) eval  Flexion WNL  Extension WNL  Right lateral flexion   Left lateral flexion   Right rotation 60  Left rotation 60   (Blank rows = not tested)  UPPER EXTREMITY ROM: Full active range of motion of shoulders   Patient has opening and closing shift to the right greater than 1 cm shift noted  Jaw opening 5.3 cm with pain at end range    TREATMENT  DATE:  10/15 Trigger Point Dry Needling  Initial Treatment: Pt instructed on Dry Needling rational, procedures, and possible side effects. Pt instructed to expect mild to moderate muscle soreness later in the day and/or into the next day.  Pt instructed in methods to reduce muscle  soreness. Pt instructed to continue prescribed HEP. Because Dry Needling was performed over or adjacent to a lung field, pt was educated on S/S of pneumothorax and to seek immediate medical attention should they occur.  Patient was educated on signs and symptoms of infection and other risk factors and advised to seek medical attention should they occur.  Patient verbalized understanding of these instructions and education.   Patient Verbal Consent Given: Yes Education Handout Provided: Yes Muscles Treated: masseter bilateral 2x each side upper trap 3x left 2x right .30x50 Electrical Stimulation Performed: No Treatment Response/Outcome: great twitch with all   Manual: Trigger point release to upper traps/suboccipitals/masseter/cervical paraspinals Skilled palpation of trigger points Review of self soft tissue mobilization Trigger point release to levator area  Sub-occipital release   10/1   Trigger Point Dry Needling  Initial Treatment: Pt instructed on Dry Needling rational, procedures, and possible side effects. Pt instructed to expect mild to moderate muscle soreness later in the day and/or into the next day.  Pt instructed in methods to reduce muscle soreness. Pt instructed to continue prescribed HEP. Because Dry Needling was performed over or adjacent to a lung field, pt was educated on S/S of pneumothorax and to seek immediate medical attention should they occur.  Patient was educated on signs and symptoms of infection and other risk factors and advised to seek medical attention should they occur.  Patient verbalized understanding of these instructions and education.   Patient Verbal Consent  Given: Yes Education Handout Provided: Yes Muscles Treated: masseter bilateral 2x each side upper trap 3x left 2x right .30x50 Lateral Ptyregoind 2x each side Electrical Stimulation Performed: No Treatment Response/Outcome: great twitch with all   Manual: Trigger point release to upper traps/suboccipitals/masseter/cervical paraspinals Skilled palpation of trigger points Review of self soft tissue mobilization Trigger point release to levator area      Last visit:  Neuro-re-ed:  Cable: Row 10 pounds RPE of 1 x 12 15 pounds RPE of six 2 x 12 Cable extension 10 pounds RPE of five 3 x 12  Reviewed how to use these exercises to prevent further exacerbations Eval:  Rocccabado exercises  Tongue clicking x 6 Controlled opening x 6 Chin tucks x 6 Isometric deviation times five 5-second holds Isometric opening 5 x 5-second holds Row 2 x 10 green  Manual: Trigger point release to bilateral upper traps, cervical paraspinals, and suboccipitals Review of use of Thera cane to suboccipitals and upper traps                                                                                                                               PATIENT EDUCATION:  Education details: HEP, symptom management  Person educated: Patient Education method: Explanation, Demonstration, Tactile cues, Verbal cues, and Handouts Education comprehension: verbalized understanding, returned demonstration, verbal cues required, tactile cues required, and needs further education  HOME EXERCISE PROGRAM: Access Code: [email or web address] URL: [email or web address] Date: 08/26/2024 Prepared by: Alm  Nike Southers  Exercises - Tongue Clicks TMJ  - 3 x daily - 7 x weekly - 1 sets - 6 reps - Isometric Jaw Deviation  - 3 x daily - 7 x weekly - 1 sets - 10 reps - Isometric Jaw Abduction  - 3 x daily - 7 x weekly - 1 sets - 10 reps - Seated Cervical Retraction  - 3 x daily - 7 x weekly - 1 sets - 10 reps - Scapular  Retraction with Resistance  - 3 x daily - 7 x weekly - 2 sets - 10 reps - Jaw Opening  - 3 x daily - 7 x weekly - 3 sets - 10 reps  ASSESSMENT:  CLINICAL IMPRESSION: Therapy focused more on manual therapy today. She had already worked out earlier in the days. She has been progressing her weights. She is making great progress. She has had much less pain in her jaw. She reports she has been working on not tightening her jaw and shoulders. We continued with dry needlding. We will continue as tolerated.  Eval: Patient is a 53 year old female with long history of bilateral jaw pain.  She has tenderness palpation in her masseter area bilateral upper traps, and cervical paraspinals on both sides.  She has a significant right shift with opening and closing.  She does have full opening but pain at end range end ranges.  She has increased muscle tension in her upper traps and cervical paraspinals.  She would benefit from skilled therapy to improve her ability to eat and perform daily activities without pain OBJECTIVE IMPAIRMENTS: Difficulty chewing difficulty yawning goal, pain muscle tightness.   ACTIVITY LIMITATIONS: Eating, yawning  PARTICIPATION LIMITATIONS:  PERSONAL FACTORS: 1 comorbidity: Anxiety, migraines are also affecting patient's functional outcome.   REHAB POTENTIAL: Good  CLINICAL DECISION MAKING: Stable/uncomplicated  EVALUATION COMPLEXITY: Low   GOALS: Goals reviewed with patient? Yes  SHORT TERM GOALS: Target date: 09/23/2024    Patient will demonstrate full jaqopening without bilateral jaw pain Baseline:  Goal status:  improving 10/1  2.  Patient will show no deviation with opening and closing Baseline:  Goal status: improving 10/1  3.  Patient will be independent with self soft tissue mobilization Baseline:  Goal status: INITIAL  4.  Patient will perform Rocco bottles with minimal cueing Baseline:  Goal status: INITIAL   LONG TERM GOALS: Target date:  10/21/2024    Patient will be independent with complete posterior chain strengthening HEP to prevent further exacerbation Baseline:  Goal status: INITIAL  2.  Patient will chew food without pain Baseline:  Goal status: INITIAL  3.  Patient will have no pain carrying conversations Baseline:  Goal status: INITIAL    PLAN:  PT FREQUENCY: 2x/week  PT DURATION: 8 weeks  PLANNED INTERVENTIONS: 97110-Therapeutic exercises, 97530- Therapeutic activity, 97112- Neuromuscular re-education, 97535- Self Care, [id number]- Manual therapy, [id number]- Gait training, c Therapy, 97014- Electrical stimulation (unattended), 97035- Ultrasound, Patient/Family education, Stair training, Taping, Dry Needling, DME instructions, Cryotherapy, and Moist heat   PLAN FOR NEXT SESSION: Trigger point dry needling to masseter and lateral pterygoid, trigger point dry needling to upper traps and cervical paraspinals.  Soft tissue mobilization upper traps and cervical paraspinals as well as masseter.  Review rows and shoulder extensions.  Reviewed cervical retraction.  Review current HEP and troubleshoot.   Alm JINNY Don, PT 10/12/2024, 3:35 PM

## 2024-10-18 ENCOUNTER — Encounter (HOSPITAL_BASED_OUTPATIENT_CLINIC_OR_DEPARTMENT_OTHER): Payer: Self-pay | Admitting: Physical Therapy

## 2024-10-18 ENCOUNTER — Ambulatory Visit (HOSPITAL_BASED_OUTPATIENT_CLINIC_OR_DEPARTMENT_OTHER): Admitting: Physical Therapy

## 2024-10-18 DIAGNOSIS — R293 Abnormal posture: Secondary | ICD-10-CM | POA: Diagnosis not present

## 2024-10-18 DIAGNOSIS — R278 Other lack of coordination: Secondary | ICD-10-CM

## 2024-10-18 DIAGNOSIS — M6281 Muscle weakness (generalized): Secondary | ICD-10-CM

## 2024-10-18 DIAGNOSIS — M62838 Other muscle spasm: Secondary | ICD-10-CM

## 2024-10-19 ENCOUNTER — Encounter (HOSPITAL_BASED_OUTPATIENT_CLINIC_OR_DEPARTMENT_OTHER): Payer: Self-pay | Admitting: Physical Therapy

## 2024-10-19 NOTE — Therapy (Signed)
 OUTPATIENT PHYSICAL THERAPY CERVICAL EVALUATION   Patient Name: Shelly Wilkinson MRN: [id number] DOB:10/03/1971, 53 y.o., female Today's Date: 10/19/2024  END OF SESSION:  PT End of Session - 10/18/24 1438     Visit Number 5    Number of Visits 16    Date for Recertification  10/21/24    PT Start Time 1430    PT Stop Time 1512    PT Time Calculation (min) 42 min    Activity Tolerance Patient tolerated treatment well    Behavior During Therapy [facility] for tasks assessed/performed           Past Medical History:  Diagnosis Date   Allergy    Anemia, iron deficiency 08/25/2016   Anxiety    Arthritis    Choroidal nevus    Chronic anal fissure    Complication of anesthesia    headaches/   06-22-2014 surg. had urinary retention   Constipation due to outlet dysfunction    Dyssynergic constipation 10/01/2016   Anorectal mano   Episcleritis-uveitis? 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   Left sided ulcerative colitis (? Crohn's) with complication (HCC) 11/18/2016   Diagnosed fall 2017. Indeterminate probably. Rectal involvement but patchy small ulcers. Initially chewed with Lialda  and uceris  was added. Changed to Humira  May 2018. Prednisone  started April 2018. Improved.     07/2022 Yusimry  - biosimilar started   Macular degeneration    Migraines    Thrombosed hemorrhoids    Vitamin D  deficiency 11/19/2016   Wears glasses    Past Surgical History:  Procedure Laterality Date   ANAL RECTAL MANOMETRY N/A 09/17/2016   Procedure: ANO RECTAL MANOMETRY;  Surgeon: Lupita FORBES Commander, MD;  Location: [facility];  Service: Endoscopy;  Laterality: N/A;   CHEILECTOMY Right 12/15/2013   Procedure: RIGHT HALLUX CHEILECTOMY;  Surgeon: Norleen Armor, MD;  Location: [facility];  Service: Orthopedics;  Laterality: Right;   CHEILECTOMY Left 02-12-2006   great toe   COLONOSCOPY  10-02-2010   CYSTO/  HYDRODISTENTION/  INSTILLATION THERAPY  03-19-2010   D & C HYSTEROSCOPY /  HYDROTHERMAL ENDOMETRIAL ABLATION/  LEEP  05-29-2011   DILATION AND EVACUATION  2002   ESOPHAGOGASTRODUODENOSCOPY (EGD) WITH PROPOFOL  N/A 04/17/2022   Procedure: ESOPHAGOGASTRODUODENOSCOPY (EGD) WITH PROPOFOL ;  Surgeon: Teressa Toribio SQUIBB, MD;  Location: [facility];  Service: Gastroenterology;  Laterality: N/A;   EUS N/A 04/17/2022   Procedure: UPPER ENDOSCOPIC ULTRASOUND (EUS) RADIAL;  Surgeon: Teressa Toribio SQUIBB, MD;  Location: [facility];  Service: Gastroenterology;  Laterality: N/A;   EVALUATION UNDER ANESTHESIA WITH ANAL FISTULECTOMY N/A 12/26/2014   Procedure: EXAM UNDER ANESTHESIA ;  Surgeon: Bernarda Ned, MD;  Location: [facility];  Service: General;  Laterality: N/A;   HEMORRHOID BANDING  02-16-2014   REPAIR LACERATED RADIAL DIGITAL NERVE LEFT RING FINGER  08-25-2005   SPHINCTEROTOMY N/A 12/26/2014   Procedure: CHEMICAL SPHINCTEROTOMY (BOTOX );  Surgeon: Bernarda Ned, MD;  Location: [facility];  Service: General;  Laterality: N/A;   TRANSANAL HEMORRHOIDAL DEARTERIALIZATION  06-22-2014   Patient Active Problem List   Diagnosis Date Noted   Abnormal transaminases 07/03/2024   Osteopenia after menopause 12/17/2023   Chronic migraine without aura without status migrainosus, not intractable 09/12/2022   Abnormal CT scan, stomach  Hyperinsulinemic hypoglycemia 02/18/2022   Hypoglycemia 12/10/2021   Muscle spasm 11/30/2020   Abnormal mammogram of right breast 05/03/2018   Long-term use of immunosuppressant medication - Humira  06/16/2017   Vitamin D  deficiency 11/19/2016   Left sided ulcerative colitis (? Crohn's) with complication (HCC) 11/18/2016   Anemia, iron deficiency 08/25/2016   Prolapsed internal hemorrhoids, grade 3 02/27/2016    Interstitial cystitis 10/19/2012   Migraine headache 10/19/2012    PCP: Trula Brim MD  REFERRING PROVIDER: Latrelle Holt PA  REFERRING DIAG:  TMJ  THERAPY DIAG:  Abnormal posture  Other muscle spasm  Muscle weakness (generalized)  Other lack of coordination  Rationale for Evaluation and Treatment: Rehabilitation  ONSET DATE:    SUBJECTIVE:                                                                                                                                                                                                         SUBJECTIVE STATEMENT: The patient reports continued improvement. Her jaw is doing better. She has been going to the gym and advancing her exercises.   Eval: The patient has long history of jaw pain.  She has increased pain when chewing and opening.  She also clenches her teeth at night.  She has a bite guard.  She also has a long history of migraines.  She gets Botox  in her forehead.  Hand dominance: Right  PERTINENT HISTORY:  Anxiety: Arthritis in the feet: Migraines: Macular degeneration:  PAIN:  Are you having pain? Yes: NPRS scale: 5/10 7/20 at worst Pain location: Bilateral jaw Pain description: aching  Aggravating factors: chewing  Relieving factors: ice, massage   PRECAUTIONS: None  RED FLAGS: None     WEIGHT BEARING RESTRICTIONS: No  FALLS:  Has patient fallen in last 6 months? No  LIVING ENVIRONMENT: Nothing pertinent  OCCUPATION:  Teacher   PLOF: Independent  PATIENT GOALS:  To have less pain in her jaw  NEXT MD VISIT: Nothing scheduled  OBJECTIVE:  Note: Objective measures were completed at Evaluation unless otherwise noted.  DIAGNOSTIC FINDINGS:  Nothing of the jaw  PATIENT SURVEYS:  TMJ outcome measure   COGNITION: Overall cognitive status: Within functional limits for tasks assessed  SENSATION: WNL  POSTURE: forward head  PALPATION: Significant tightness in upper traps and cervical  spine    CERVICAL ROM:   Active ROM A/PROM (deg) eval  Flexion WNL  Extension WNL  Right lateral flexion   Left lateral flexion   Right rotation 60  Left rotation 60   (Blank rows = not  tested)  UPPER EXTREMITY ROM: Full active range of motion of shoulders   Patient has opening and closing shift to the right greater than 1 cm shift noted  Jaw opening 5.3 cm with pain at end range    TREATMENT DATE:  10/22 Trigger Point Dry Needling  Initial Treatment: Pt instructed on Dry Needling rational, procedures, and possible side effects. Pt instructed to expect mild to moderate muscle soreness later in the day and/or into the next day.  Pt instructed in methods to reduce muscle soreness. Pt instructed to continue prescribed HEP. Because Dry Needling was performed over or adjacent to a lung field, pt was educated on S/S of pneumothorax and to seek immediate medical attention should they occur.  Patient was educated on signs and symptoms of infection and other risk factors and advised to seek medical attention should they occur.  Patient verbalized understanding of these instructions and education.   Patient Verbal Consent Given: Yes Education Handout Provided: Yes Muscles Treated: masseter bilateral 2x each side upper trap 3x left 2x right .30x50, right lateral Ptryegoid  Electrical Stimulation Performed: No Treatment Response/Outcome: great twitch with all   Manual: Trigger point release to upper traps/suboccipitals/masseter/cervical paraspinals Skilled palpation of trigger points Review of self soft tissue mobilization Trigger point release to levator area  Sub-occipital release    10/15 Trigger Point Dry Needling  Initial Treatment: Pt instructed on Dry Needling rational, procedures, and possible side effects. Pt instructed to expect mild to moderate muscle soreness later in the day and/or into the next day.  Pt instructed in methods to reduce muscle soreness. Pt  instructed to continue prescribed HEP. Because Dry Needling was performed over or adjacent to a lung field, pt was educated on S/S of pneumothorax and to seek immediate medical attention should they occur.  Patient was educated on signs and symptoms of infection and other risk factors and advised to seek medical attention should they occur.  Patient verbalized understanding of these instructions and education.   Patient Verbal Consent Given: Yes Education Handout Provided: Yes Muscles Treated: masseter bilateral 2x each side upper trap 3x left 2x right .30x50 Electrical Stimulation Performed: No Treatment Response/Outcome: great twitch with all   Manual: Trigger point release to upper traps/suboccipitals/masseter/cervical paraspinals Skilled palpation of trigger points Review of self soft tissue mobilization Trigger point release to levator area  Sub-occipital release   10/1   Trigger Point Dry Needling  Initial Treatment: Pt instructed on Dry Needling rational, procedures, and possible side effects. Pt instructed to expect mild to moderate muscle soreness later in the day and/or into the next day.  Pt instructed in methods to reduce muscle soreness. Pt instructed to continue prescribed HEP. Because Dry Needling was performed over or adjacent to a lung field, pt was educated on S/S of pneumothorax and to seek immediate medical attention should they occur.  Patient was educated on signs and symptoms of infection and other risk factors and advised to seek medical attention should they occur.  Patient verbalized understanding of these instructions and education.   Patient Verbal Consent Given: Yes Education Handout Provided: Yes Muscles Treated: masseter bilateral 2x each side upper trap 3x left 2x right .30x50 Lateral Ptyregoind 2x each side Electrical Stimulation Performed: No Treatment Response/Outcome: great twitch with all   Manual: Trigger point release to upper  traps/suboccipitals/masseter/cervical paraspinals Skilled palpation of trigger points Review of self soft tissue mobilization Trigger point release to levator area      Last visit:  Neuro-re-ed:  Cable: Row 10 pounds RPE of 1 x 12 15 pounds RPE of six 2 x 12 Cable extension 10 pounds RPE of five 3 x 12  Reviewed how to use these exercises to prevent further exacerbations Eval:  Rocccabado exercises  Tongue clicking x 6 Controlled opening x 6 Chin tucks x 6 Isometric deviation times five 5-second holds Isometric opening 5 x 5-second holds Row 2 x 10 green  Manual: Trigger point release to bilateral upper traps, cervical paraspinals, and suboccipitals Review of use of Thera cane to suboccipitals and upper traps                                                                                                                               PATIENT EDUCATION:  Education details: HEP, symptom management  Person educated: Patient Education method: Explanation, Demonstration, Tactile cues, Verbal cues, and Handouts Education comprehension: verbalized understanding, returned demonstration, verbal cues required, tactile cues required, and needs further education  HOME EXERCISE PROGRAM: Access Code: [email or web address] URL: [email or web address] Date: 08/26/2024 Prepared by: Alm Don  Exercises - Tongue Clicks TMJ  - 3 x daily - 7 x weekly - 1 sets - 6 reps - Isometric Jaw Deviation  - 3 x daily - 7 x weekly - 1 sets - 10 reps - Isometric Jaw Abduction  - 3 x daily - 7 x weekly - 1 sets - 10 reps - Seated Cervical Retraction  - 3 x daily - 7 x weekly - 1 sets - 10 reps - Scapular Retraction with Resistance  - 3 x daily - 7 x weekly - 2 sets - 10 reps - Jaw Opening  - 3 x daily - 7 x weekly - 3 sets - 10 reps  ASSESSMENT:  CLINICAL IMPRESSION: The patient had an acute onset of pain over the past few days. Therapy focused on manual therapy and needling the ptryegoid. We  reviewed her exercises for home for her jaw. Therapy will continue to progress as tolerated.   Eval: Patient is a 53 year old female with long history of bilateral jaw pain.  She has tenderness palpation in her masseter area bilateral upper traps, and cervical paraspinals on both sides.  She has a significant right shift with opening and closing.  She does have full opening but pain at end range end ranges.  She has increased muscle tension in her upper traps and cervical paraspinals.  She would benefit from skilled therapy to improve her ability to eat and perform daily activities without pain OBJECTIVE IMPAIRMENTS: Difficulty chewing difficulty yawning goal, pain muscle tightness.   ACTIVITY LIMITATIONS: Eating, yawning  PARTICIPATION LIMITATIONS:  PERSONAL FACTORS: 1 comorbidity: Anxiety, migraines are also affecting patient's functional outcome.   REHAB POTENTIAL: Good  CLINICAL DECISION MAKING: Stable/uncomplicated  EVALUATION COMPLEXITY: Low   GOALS: Goals reviewed with patient? Yes  SHORT TERM GOALS: Target date: 09/23/2024    Patient will demonstrate full jaqopening without bilateral jaw pain Baseline:  Goal  status:  improving 10/1  2.  Patient will show no deviation with opening and closing Baseline:  Goal status: improving 10/1  3.  Patient will be independent with self soft tissue mobilization Baseline:  Goal status:  using thera-cane at home 10/22  4.  Patient will perform Rocco bottles with minimal cueing Baseline:  Goal status: INITIAL   LONG TERM GOALS: Target date: 10/21/2024    Patient will be independent with complete posterior chain strengthening HEP to prevent further exacerbation Baseline:  Goal status: INITIAL  2.  Patient will chew food without pain Baseline:  Goal status: INITIAL  3.  Patient will have no pain carrying conversations Baseline:  Goal status: INITIAL    PLAN:  PT FREQUENCY: 2x/week  PT DURATION: 8 weeks  PLANNED  INTERVENTIONS: 97110-Therapeutic exercises, 97530- Therapeutic activity, 97112- Neuromuscular re-education, 97535- Self Care, [id number]- Manual therapy, 97116- Gait training, c Therapy, 97014- Electrical stimulation (unattended), 97035- Ultrasound, Patient/Family education, Stair training, Taping, Dry Needling, DME instructions, Cryotherapy, and Moist heat   PLAN FOR NEXT SESSION: Trigger point dry needling to masseter and lateral pterygoid, trigger point dry needling to upper traps and cervical paraspinals.  Soft tissue mobilization upper traps and cervical paraspinals as well as masseter.  Review rows and shoulder extensions.  Reviewed cervical retraction.  Review current HEP and troubleshoot.   Alm JINNY Don, PT 10/19/2024, 1:26 PM

## 2024-10-20 ENCOUNTER — Telehealth: Payer: Self-pay | Admitting: Internal Medicine

## 2024-10-20 ENCOUNTER — Other Ambulatory Visit: Payer: Self-pay | Admitting: *Deleted

## 2024-10-20 MED ORDER — BOTOX 200 UNITS IJ SOLR
200.0000 [IU] | INTRAMUSCULAR | 2 refills | Status: DC
Start: 2024-10-20 — End: 2024-11-14

## 2024-10-20 NOTE — Telephone Encounter (Signed)
 Inbound call from becky with [facility] calling in regards to prescription request for humira    Fax number [phone] Call back number [phone]

## 2024-10-20 NOTE — Telephone Encounter (Signed)
 Left message on machine to call back with [facility]

## 2024-10-21 ENCOUNTER — Ambulatory Visit: Admitting: Physician Assistant

## 2024-10-21 ENCOUNTER — Encounter: Payer: Self-pay | Admitting: Physician Assistant

## 2024-10-21 ENCOUNTER — Encounter: Payer: Self-pay | Admitting: Internal Medicine

## 2024-10-21 VITALS — BP 117/76 | HR 77 | Wt 134.0 lb

## 2024-10-21 DIAGNOSIS — M62838 Other muscle spasm: Secondary | ICD-10-CM | POA: Diagnosis not present

## 2024-10-21 DIAGNOSIS — G43009 Migraine without aura, not intractable, without status migrainosus: Secondary | ICD-10-CM | POA: Diagnosis not present

## 2024-10-21 DIAGNOSIS — G43709 Chronic migraine without aura, not intractable, without status migrainosus: Secondary | ICD-10-CM

## 2024-10-21 MED ORDER — YUSIMRY 40 MG/0.8ML ~~LOC~~ SOAJ: 1.0000 | SUBCUTANEOUS | 11 refills | Status: DC

## 2024-10-21 MED ORDER — ONABOTULINUMTOXINA 200 UNITS IJ SOLR
200.0000 [IU] | Freq: Once | INTRAMUSCULAR | Status: AC
  Administered 2024-10-21: 200 [IU] via INTRAMUSCULAR

## 2024-10-21 NOTE — Progress Notes (Signed)
 Patient here for Botox  injection.   S: Pt in office today for Botox  injections. Her Botox  has been working very well for migraine prevention along with Nurtec.   O: BP 117/76   Pulse 77   Wt 134 lb (60.8 kg)   BMI 24.51 kg/m    Botox  Procedure Note Vial of Botox  was : Supplied by Patient   Botox  Dosing by [employer] for Chronic Migraine   Injection Sites for Migraines  Botox  155 units was injected using the dosage in the table above in the pattern shown above.  A: Migraine  Muscle spasm   P: Botox  155 units injected today.  RTC 3 Months.

## 2024-10-21 NOTE — Telephone Encounter (Signed)
 Shipping should be as soon as possible.  Attempted to [facility] again by phone and had to leave another message.

## 2024-10-21 NOTE — Telephone Encounter (Signed)
 Left message on machine to call back

## 2024-10-21 NOTE — Telephone Encounter (Signed)
 Inbound call from [place] with [facility] requesting to speak to the Shelly Wilkinson. They are requesting a call back to know on weather or not the patient is needing to have a rush on her prescription or not. Please advise.

## 2024-10-24 ENCOUNTER — Telehealth: Payer: Self-pay

## 2024-10-24 ENCOUNTER — Other Ambulatory Visit (HOSPITAL_COMMUNITY): Payer: Self-pay

## 2024-10-24 NOTE — Telephone Encounter (Signed)
 Shelly Wilkinson the pt had colonoscopy in January of this year. Will that suffice?  I have let the pt know to upload current card and make follow up appt.

## 2024-10-24 NOTE — Telephone Encounter (Signed)
 Specialty medications require office visits for documentation on patient progress, concerns, etc. A procedure visit does not do this. Labs will also need to be done if they have not been done in a year pertaining to any levels that need to be checked.

## 2024-10-24 NOTE — Telephone Encounter (Signed)
 Pharmacy Patient Advocate Encounter   Received notification from [employer] that prior authorization for Adalimumab -aqvh (YUSIMRY ) 40 MG/0.8ML SOAJ is required/requested.   Insurance verification completed.   The patient is insured through [employer].   Per test claim: Patient has not been seen in office since 2023 and office visit is required for insurance submission. Additionally, need  patient's updated pharmacy benefits card as the one currently on file is terminated and there is not another that shows up when searched.

## 2024-10-24 NOTE — Telephone Encounter (Signed)
 See alternate note for communication

## 2024-10-25 ENCOUNTER — Telehealth: Payer: Self-pay | Admitting: Internal Medicine

## 2024-10-25 MED ORDER — YUSIMRY 40 MG/0.8ML ~~LOC~~ SOAJ: 1.0000 | SUBCUTANEOUS | 11 refills | Status: DC

## 2024-10-25 NOTE — Telephone Encounter (Signed)
 Prescription has been sent to [facility]

## 2024-10-25 NOTE — Telephone Encounter (Signed)
 Inbound call from Candace from [facility] stating that they would be the ones who would be sending patients Simlandi. They state they need a prescription for her so they can send the medication to her in the mail on 11/3. Please advise.

## 2024-10-27 ENCOUNTER — Encounter: Payer: Self-pay | Admitting: *Deleted

## 2024-11-01 ENCOUNTER — Telehealth: Payer: Self-pay | Admitting: Internal Medicine

## 2024-11-01 MED ORDER — YUSIMRY 40 MG/0.8ML ~~LOC~~ SOAJ: 1.0000 | SUBCUTANEOUS | 11 refills | Status: DC

## 2024-11-01 NOTE — Telephone Encounter (Signed)
The prescription has been sent as requested.  

## 2024-11-01 NOTE — Progress Notes (Unsigned)
 Chief Complaint: Follow-up Crohn's versus UC  HPI:    Shelly Wilkinson is a 53 year old female with a past medical history as listed below including left-sided ulcerative colitis versus Crohn's, known to Dr. Avram, who presents to clinic today for follow-up of her Crohn's versus UC.    02/18/2022 office visit with Dr. Avram.  Discussed that patient had left-sided colitis issue, Crohn's versus UC and complicated hemorrhoids in the past, at that time IBD doing well on Humira .  Recommended repeat colonoscopy in 2025.  At that time discussed an EUS given ongoing hypoglycemia with question of insulinoma.       04/17/2022 EUS was normal.    01/05/2024 colonoscopy with hemorrhoids, one 5 mm polyp in the descending colon, one 2 mm polyp in the sigmoid colon, altered vascular mucosa in the entire colon, biopsied, no signs of active IBD, improved first 2019, external and internal hemorrhoids.  Recommended a different prep at next time of colonoscopy.  Biopsy showed colonic mucosa with minimal architectural disarray suggestive of a chronic inactive/quiescent colitis.  Also tubular adenoma and hyperplastic polyp.  Repeat recommended 2 years.  Past Medical History:  Diagnosis Date   Allergy    Anemia, iron deficiency 08/25/2016   Anxiety    Arthritis    Choroidal nevus    Chronic anal fissure    Complication of anesthesia    headaches/   06-22-2014 surg. had urinary retention   Constipation due to outlet dysfunction    Dyssynergic constipation 10/01/2016   Anorectal mano   Episcleritis-uveitis? 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   Left sided ulcerative colitis (? Crohn's) with complication (HCC) 11/18/2016   Diagnosed fall 2017. Indeterminate probably. Rectal involvement but patchy small  ulcers. Initially chewed with Lialda  and uceris  was added. Changed to Humira  May 2018. Prednisone  started April 2018. Improved.     07/2022 Yusimry  - biosimilar started   Macular degeneration    Migraines    Thrombosed hemorrhoids    Vitamin D  deficiency 11/19/2016   Wears glasses     Past Surgical History:  Procedure Laterality Date   ANAL RECTAL MANOMETRY N/A 09/17/2016   Procedure: ANO RECTAL MANOMETRY;  Surgeon: Lupita FORBES Avram, MD;  Location: [facility];  Service: Endoscopy;  Laterality: N/A;   CHEILECTOMY Right 12/15/2013   Procedure: RIGHT HALLUX CHEILECTOMY;  Surgeon: Norleen Armor, MD;  Location: [facility];  Service: Orthopedics;  Laterality: Right;   CHEILECTOMY Left 02-12-2006   great toe   COLONOSCOPY  10-02-2010   CYSTO/  HYDRODISTENTION/  INSTILLATION THERAPY  03-19-2010   D & C HYSTEROSCOPY /  HYDROTHERMAL ENDOMETRIAL ABLATION/  LEEP  05-29-2011   DILATION AND EVACUATION  2002   ESOPHAGOGASTRODUODENOSCOPY (EGD) WITH PROPOFOL  N/A 04/17/2022   Procedure: ESOPHAGOGASTRODUODENOSCOPY (EGD) WITH PROPOFOL ;  Surgeon: Teressa Toribio SQUIBB, MD;  Location: [facility];  Service: Gastroenterology;  Laterality: N/A;   EUS N/A 04/17/2022   Procedure: UPPER ENDOSCOPIC ULTRASOUND (EUS) RADIAL;  Surgeon: Teressa Toribio SQUIBB, MD;  Location: [facility];  Service: Gastroenterology;  Laterality: N/A;   EVALUATION UNDER ANESTHESIA WITH ANAL FISTULECTOMY N/A 12/26/2014   Procedure: EXAM UNDER ANESTHESIA ;  Surgeon: Bernarda Ned, MD;  [facility];  Service: General;  Laterality: N/A;   HEMORRHOID BANDING  02-16-2014  REPAIR LACERATED RADIAL DIGITAL NERVE LEFT RING FINGER  08-25-2005   SPHINCTEROTOMY N/A 12/26/2014   Procedure: CHEMICAL SPHINCTEROTOMY (BOTOX );  Surgeon: Bernarda Ned, MD;  Location: [facility];  Service: General;  Laterality: N/A;   TRANSANAL HEMORRHOIDAL DEARTERIALIZATION  06-22-2014    Current Outpatient Medications   Medication Sig Dispense Refill   Adalimumab -aqvh (YUSIMRY ) 40 MG/0.8ML SOAJ Inject 1 Pen as directed every 14 (fourteen) days. 1.6 mL 11   ALOE VERA PO Take 1-2 capsules by mouth See admin instructions. Take 1 capsule in the AM and 2 capsules in the PM.     botulinum toxin Type A  (BOTOX ) 200 units injection Inject 200 Units into the muscle every 3 (three) months. 1 each 2   cholecalciferol (VITAMIN D ) 1000 units tablet Take 1,000 Units by mouth daily.     [employer] SUNSCREEN SPF 30 EX apply topically to face and body daily for 30 (Patient not taking: Reported on 10/10/2024)     cycloSPORINE (RESTASIS) 0.05 % ophthalmic emulsion Place 1 drop into both eyes 2 (two) times daily.     DULoxetine  (CYMBALTA ) 60 MG capsule TAKE 1 CAPSULE BY MOUTH DAILY (Patient taking differently: Take 60 mg by mouth daily. Pt reported taking 3 x weekly, tapering down) 90 capsule 3   estradiol  (VIVELLE -DOT) 0.025 MG/24HR Place 1 patch onto the skin 2 (two) times a week. 8 patch 12   Estradiol  10 MCG TABS vaginal tablet Place 1 tablet (10 mcg total) vaginally 2 (two) times a week. Place one tablet vaginally daily for two weeks and then switch to twice weekly 30 tablet 12   Evening Primrose Oil 500 MG CAPS Take 1 capsule by mouth daily.     ibuprofen (ADVIL,MOTRIN) 200 MG tablet Take 600 mg by mouth every 6 (six) hours as needed for mild pain. Patient takes as needed     levothyroxine (SYNTHROID) 75 MCG tablet Take 75 mcg by mouth daily before breakfast.     liothyronine (CYTOMEL) 5 MCG tablet Take 5 mcg by mouth daily.     methylphenidate 27 MG PO TB24 Take 1 tablet by mouth daily. Taking  36 mg     Multiple Vitamins-Minerals (PRESERVISION AREDS PO) Take 1 tablet by mouth in the morning and at bedtime.     OVER THE COUNTER MEDICATION Take by mouth at bedtime. Evening Primrose oil     progesterone (PROMETRIUM) 100 MG capsule Take 1 capsule (100 mg total) by mouth daily. 30 capsule 3   Rimegepant Sulfate (NURTEC) 75 MG TBDP Take  1 tablet (75 mg total) by mouth every other day. 16 tablet 11   rosuvastatin  (CRESTOR ) 20 MG tablet Take 1 tablet (20 mg total) by mouth daily. (Patient not taking: Reported on 10/10/2024) 90 tablet 3   tazarotene (TAZORAC) 0.05 % cream Apply 1 application. topically at bedtime.     No current facility-administered medications for this visit.    Allergies as of 11/02/2024 - Review Complete 10/21/2024  Allergen Reaction Noted   Sulfa antibiotics Hives, Nausea And Vomiting, and Other (See Comments) 12/15/2014    Family History  Problem Relation Age of Onset   Diabetes Father    Heart disease Father    Heart attack Father    Hypertension Father    Colitis Father    Stroke Father    Colon cancer Maternal Aunt 40   Rheum arthritis Maternal Grandmother    Heart attack Paternal Grandmother    Lung cancer Paternal Grandfather    Stomach cancer  Neg Hx    Esophageal cancer Neg Hx    Pancreatic cancer Neg Hx    Rectal cancer Neg Hx     Social History   Socioeconomic History   Marital status: Married    Spouse name: Not on file   Number of children: 2   Years of education: Not on file   Highest education level: Not on file  Occupational History   Occupation: TEACHER     Employer: [employer]  Tobacco Use   Smoking status: Never   Smokeless tobacco: Never  Vaping Use   Vaping status: Never Used  Substance and Sexual Activity   Alcohol use: Yes    Comment: rarely   Drug use: No   Sexual activity: Yes    Partners: Male    Birth control/protection: Surgical    Comment: ablation  Other Topics Concern   Not on file  Social History Narrative   Married, 2 children - sons one [place]-graduated one [place]    Fourth  grade teacher   No drugs some alcohol never smoker      Social Drivers of Corporate Investment Banker Strain: Not on file  Food Insecurity: Not on file  Transportation Needs: Not on file  Physical Activity: Not on file  Stress: Not on file   Social Connections: Not on file  Intimate Partner Violence: Not on file    Review of Systems:    Constitutional: No weight loss, fever, chills, weakness or fatigue HEENT: Eyes: No change in vision               Ears, Nose, Throat:  No change in hearing or congestion Skin: No rash or itching Cardiovascular: No chest pain, chest pressure or palpitations   Respiratory: No SOB or cough Gastrointestinal: See HPI and otherwise negative Genitourinary: No dysuria or change in urinary frequency Neurological: No headache, dizziness or syncope Musculoskeletal: No new muscle or joint pain Hematologic: No bleeding or bruising Psychiatric: No history of depression or anxiety    Physical Exam:  Vital signs: There were no vitals taken for this visit.  Constitutional:   Pleasant Caucasian female appears to be in NAD, Well developed, Well nourished, alert and cooperative Head:  Normocephalic and atraumatic. Eyes:   PEERL, EOMI. No icterus. Conjunctiva pink. Ears:  Normal auditory acuity. Neck:  Supple Throat: Oral cavity and pharynx without inflammation, swelling or lesion.  Respiratory: Respirations even and unlabored. Lungs clear to auscultation bilaterally.   No wheezes, crackles, or rhonchi.  Cardiovascular: Normal S1, S2. No MRG. Regular rate and rhythm. No peripheral edema, cyanosis or pallor.  Gastrointestinal:  Soft, nondistended, nontender. No rebound or guarding. Normal bowel sounds. No appreciable masses or hepatomegaly. Rectal:  Not performed.  Msk:  Symmetrical without gross deformities. Without edema, no deformity or joint abnormality.  Neurologic:  Alert and  oriented x4;  grossly normal neurologically.  Skin:   Dry and intact without significant lesions or rashes. Psychiatric: Oriented to person, place and time. Demonstrates good judgement and reason without abnormal affect or behaviors.  RELEVANT LABS AND IMAGING: CBC    Component Value Date/Time   WBC 6.2 12/31/2023 1307    RBC 4.67 12/31/2023 1307   HGB 14.1 12/31/2023 1307   HCT 42.6 12/31/2023 1307   PLT 233.0 12/31/2023 1307   MCV 91.3 12/31/2023 1307   MCH 30.7 05/29/2011 0801   MCHC 33.0 12/31/2023 1307   RDW 12.7 12/31/2023 1307   LYMPHSABS 3.1 12/31/2023 1307  MONOABS 0.7 12/31/2023 1307   EOSABS 0.2 12/31/2023 1307   BASOSABS 0.0 12/31/2023 1307    CMP     Component Value Date/Time   NA 140 12/03/2023 0816   K 4.7 12/03/2023 0816   CL 104 12/03/2023 0816   CO2 28 12/03/2023 0816   GLUCOSE 87 12/03/2023 0816   GLUCOSE 79 11/01/2020 1624   BUN 10 12/03/2023 0816   CREATININE 0.75 12/03/2023 0816   CALCIUM  9.7 12/03/2023 0816   PROT 7.8 08/19/2024 1409   PROT 7.2 03/21/2024 1321   ALBUMIN 4.5 08/19/2024 1409   ALBUMIN 4.7 03/21/2024 1321   AST 63 (H) 08/19/2024 1409   ALT 89 (H) 08/19/2024 1409   ALKPHOS 95 08/19/2024 1409   BILITOT 1.4 (H) 08/19/2024 1409   BILITOT 1.0 03/21/2024 1321   GFRNONAA 72.67 09/11/2010 1431    Assessment: 1. ***  Plan: 1. ***     Delon Failing, PA-C [facility] 11/01/2024, 2:02 PM  Cc: Dwight Trula SQUIBB, MD

## 2024-11-01 NOTE — Telephone Encounter (Signed)
 Inbound call from [facility] stating that they will be taking over Simlandi prescription. Requesting prescription be sent over to fax [phone]. Please advise.

## 2024-11-02 ENCOUNTER — Other Ambulatory Visit (INDEPENDENT_AMBULATORY_CARE_PROVIDER_SITE_OTHER)

## 2024-11-02 ENCOUNTER — Encounter: Payer: Self-pay | Admitting: Physician Assistant

## 2024-11-02 ENCOUNTER — Ambulatory Visit: Payer: Self-pay | Admitting: Physician Assistant

## 2024-11-02 ENCOUNTER — Ambulatory Visit: Admitting: Physician Assistant

## 2024-11-02 VITALS — BP 104/68 | HR 74 | Ht 62.0 in | Wt 132.0 lb

## 2024-11-02 DIAGNOSIS — Z7960 Long term (current) use of unspecified immunomodulators and immunosuppressants: Secondary | ICD-10-CM

## 2024-11-02 DIAGNOSIS — K5900 Constipation, unspecified: Secondary | ICD-10-CM

## 2024-11-02 DIAGNOSIS — R143 Flatulence: Secondary | ICD-10-CM | POA: Diagnosis not present

## 2024-11-02 DIAGNOSIS — K5090 Crohn's disease, unspecified, without complications: Secondary | ICD-10-CM | POA: Diagnosis not present

## 2024-11-02 LAB — CBC WITH DIFFERENTIAL/PLATELET
Basophils Absolute: 0.1 K/uL (ref 0.0–0.1)
Basophils Relative: 1 % (ref 0.0–3.0)
Eosinophils Absolute: 0.3 K/uL (ref 0.0–0.7)
Eosinophils Relative: 4.8 % (ref 0.0–5.0)
HCT: 38.6 % (ref 36.0–46.0)
Hemoglobin: 12.9 g/dL (ref 12.0–15.0)
Lymphocytes Relative: 45.5 % (ref 12.0–46.0)
Lymphs Abs: 2.7 K/uL (ref 0.7–4.0)
MCHC: 33.3 g/dL (ref 30.0–36.0)
MCV: 89.9 fl (ref 78.0–100.0)
Monocytes Absolute: 0.7 K/uL (ref 0.1–1.0)
Monocytes Relative: 11.1 % (ref 3.0–12.0)
Neutro Abs: 2.2 K/uL (ref 1.4–7.7)
Neutrophils Relative %: 37.6 % — ABNORMAL LOW (ref 43.0–77.0)
Platelets: 205 K/uL (ref 150.0–400.0)
RBC: 4.29 Mil/uL (ref 3.87–5.11)
RDW: 12.6 % (ref 11.5–15.5)
WBC: 6 K/uL (ref 4.0–10.5)

## 2024-11-02 LAB — COMPREHENSIVE METABOLIC PANEL WITH GFR
ALT: 20 U/L (ref 0–35)
AST: 20 U/L (ref 0–37)
Albumin: 4.2 g/dL (ref 3.5–5.2)
Alkaline Phosphatase: 80 U/L (ref 39–117)
BUN: 9 mg/dL (ref 6–23)
CO2: 30 meq/L (ref 19–32)
Calcium: 9.4 mg/dL (ref 8.4–10.5)
Chloride: 101 meq/L (ref 96–112)
Creatinine, Ser: 0.69 mg/dL (ref 0.40–1.20)
GFR: 98.39 mL/min (ref >60.00)
Glucose, Bld: 72 mg/dL (ref 70–99)
Potassium: 3.9 meq/L (ref 3.5–5.1)
Sodium: 136 meq/L (ref 135–145)
Total Bilirubin: 1 mg/dL (ref 0.2–1.2)
Total Protein: 7.3 g/dL (ref 6.0–8.3)

## 2024-11-02 LAB — SEDIMENTATION RATE: Sed Rate: 12 mm/h (ref 0–30)

## 2024-11-02 LAB — C-REACTIVE PROTEIN: CRP: <0.5 mg/dL (ref 0.5–20.0)

## 2024-11-02 MED ORDER — HYDROCORTISONE ACETATE 25 MG RE SUPP: 25.0000 mg | Freq: Two times a day (BID) | RECTAL | 1 refills | Status: AC

## 2024-11-02 NOTE — Patient Instructions (Addendum)
 Your provider has requested that you go to the basement level for lab work before leaving today. Press B on the elevator. The lab is located at the first door on the left as you exit the elevator.  We have sent the following medications to your pharmacy for you to pick up at your convenience: Hydrocortisone  suppository twice daily for 7 days as needed. _______________________________________________________  If your blood pressure at your visit was 140/90 or greater, please contact your primary care physician to follow up on this.  _______________________________________________________  If you are age 54 or older, your body mass index should be between 23-30. Your Body mass index is 24.14 kg/m. If this is out of the aforementioned range listed, please consider follow up with your Primary Care Provider.  If you are age 28 or younger, your body mass index should be between 19-25. Your Body mass index is 24.14 kg/m. If this is out of the aformentioned range listed, please consider follow up with your Primary Care Provider.   ________________________________________________________  The [facility] providers would like to encourage you to use MYCHART to communicate with providers for non-urgent requests or questions.  Due to long hold times on the telephone, sending your provider a message by [facility] may be a faster and more efficient way to get a response.  Please allow 48 business hours for a response.  Please remember that this is for non-urgent requests.  _______________________________________________________  [facility] is using a team-based approach to care.  Your team is made up of your doctor and two to three APPS. Our APPS (Nurse Practitioners and Physician Assistants) work with your physician to ensure care continuity for you. They are fully qualified to address your health concerns and develop a treatment plan. They communicate directly with your gastroenterologist to  care for you. Seeing the Advanced Practice Practitioners on your physician's team can help you by facilitating care more promptly, often allowing for earlier appointments, access to diagnostic testing, procedures, and other specialty referrals.

## 2024-11-04 ENCOUNTER — Other Ambulatory Visit (HOSPITAL_COMMUNITY): Payer: Self-pay

## 2024-11-04 ENCOUNTER — Ambulatory Visit (HOSPITAL_BASED_OUTPATIENT_CLINIC_OR_DEPARTMENT_OTHER): Payer: Self-pay | Attending: Physician Assistant | Admitting: Physical Therapy

## 2024-11-04 ENCOUNTER — Telehealth: Payer: Self-pay

## 2024-11-04 DIAGNOSIS — M6281 Muscle weakness (generalized): Secondary | ICD-10-CM | POA: Insufficient documentation

## 2024-11-04 DIAGNOSIS — M62838 Other muscle spasm: Secondary | ICD-10-CM | POA: Diagnosis present

## 2024-11-04 DIAGNOSIS — R278 Other lack of coordination: Secondary | ICD-10-CM | POA: Insufficient documentation

## 2024-11-04 DIAGNOSIS — R293 Abnormal posture: Secondary | ICD-10-CM | POA: Insufficient documentation

## 2024-11-04 NOTE — Telephone Encounter (Signed)
 Pharmacy Patient Advocate Encounter   Received notification from CoverMyMeds that prior authorization for hydrocortisone  (ANUSOL -HC) 25 MG suppository is required/requested.   Insurance verification completed.   The patient is insured through [employer]   Per test claim: Medication is a plan exclusion due to OTC equivalent

## 2024-11-04 NOTE — Therapy (Signed)
 OUTPATIENT PHYSICAL THERAPY CERVICAL EVALUATION   Patient Name: Shelly Wilkinson MRN: [id number] DOB:November 29, 1971, 53 y.o., female Today's Date: 11/04/2024  END OF SESSION:  PT End of Session - 11/04/24 1451     Visit Number 6    Number of Visits 16    Date for Recertification  10/21/24    PT Start Time 1302    PT Stop Time 1345    PT Time Calculation (min) 43 min    Activity Tolerance Patient tolerated treatment well    Behavior During Therapy [facility] for tasks assessed/performed            Past Medical History:  Diagnosis Date   Allergy    Anemia, iron deficiency 08/25/2016   Anxiety    Arthritis    Choroidal nevus    Chronic anal fissure    Complication of anesthesia    headaches/   06-22-2014 surg. had urinary retention   Constipation due to outlet dysfunction    Dyssynergic constipation 10/01/2016   Anorectal mano   Episcleritis-uveitis? 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   Left sided ulcerative colitis (? Crohn's) with complication (HCC) 11/18/2016   Diagnosed fall 2017. Indeterminate probably. Rectal involvement but patchy small ulcers. Initially chewed with Lialda  and uceris  was added. Changed to Humira  May 2018. Prednisone  started April 2018. Improved.     07/2022 Yusimry  - biosimilar started   Macular degeneration    Migraines    Thrombosed hemorrhoids    Vitamin D  deficiency 11/19/2016   Wears glasses    Past Surgical History:  Procedure Laterality Date   ANAL RECTAL MANOMETRY N/A 09/17/2016   Procedure: ANO RECTAL MANOMETRY;  Surgeon: Lupita FORBES Commander, MD;  Location: [facility];  Service: Endoscopy;  Laterality: N/A;   CHEILECTOMY Right 12/15/2013   Procedure: RIGHT HALLUX CHEILECTOMY;  Surgeon: Norleen Armor, MD;  Location: [facility];  Service: Orthopedics;  Laterality: Right;   CHEILECTOMY Left 02-12-2006   great toe   COLONOSCOPY  10-02-2010   CYSTO/  HYDRODISTENTION/  INSTILLATION THERAPY  03-19-2010   D & C HYSTEROSCOPY /  HYDROTHERMAL ENDOMETRIAL ABLATION/  LEEP  05-29-2011   DILATION AND EVACUATION  2002   ESOPHAGOGASTRODUODENOSCOPY (EGD) WITH PROPOFOL  N/A 04/17/2022   Procedure: ESOPHAGOGASTRODUODENOSCOPY (EGD) WITH PROPOFOL ;  Surgeon: Teressa Toribio SQUIBB, MD;  Location: [facility];  Service: Gastroenterology;  Laterality: N/A;   EUS N/A 04/17/2022   Procedure: UPPER ENDOSCOPIC ULTRASOUND (EUS) RADIAL;  Surgeon: Teressa Toribio SQUIBB, MD;  Location: [facility];  Service: Gastroenterology;  Laterality: N/A;   EVALUATION UNDER ANESTHESIA WITH ANAL FISTULECTOMY N/A 12/26/2014   Procedure: EXAM UNDER ANESTHESIA ;  Surgeon: Bernarda Ned, MD;  Location: [facility];  Service: General;  Laterality: N/A;   HEMORRHOID BANDING  02-16-2014   REPAIR LACERATED RADIAL DIGITAL NERVE LEFT RING FINGER  08-25-2005   SPHINCTEROTOMY N/A 12/26/2014   Procedure: CHEMICAL SPHINCTEROTOMY (BOTOX );  Surgeon: Bernarda Ned, MD;  Location: [facility];  Service: General;  Laterality: N/A;   TRANSANAL HEMORRHOIDAL DEARTERIALIZATION  06-22-2014   Patient Active Problem List   Diagnosis Date Noted   Abnormal transaminases 07/03/2024   Osteopenia after menopause 12/17/2023   Chronic migraine without aura without status migrainosus, not intractable 09/12/2022   Abnormal CT scan, stomach  Hyperinsulinemic hypoglycemia 02/18/2022   Hypoglycemia 12/10/2021   Muscle spasm 11/30/2020   Abnormal mammogram of right breast 05/03/2018   Long-term use of immunosuppressant medication - Humira  06/16/2017   Vitamin D  deficiency 11/19/2016   Left sided ulcerative colitis (? Crohn's) with complication (HCC) 11/18/2016   Anemia, iron deficiency 08/25/2016   Prolapsed internal hemorrhoids, grade 3 02/27/2016    Interstitial cystitis 10/19/2012   Migraine headache 10/19/2012    PCP: Trula Brim MD  REFERRING PROVIDER: Latrelle Holt PA  REFERRING DIAG:  TMJ  THERAPY DIAG:  Abnormal posture  Other muscle spasm  Muscle weakness (generalized)  Other lack of coordination  Rationale for Evaluation and Treatment: Rehabilitation  ONSET DATE:    SUBJECTIVE:                                                                                                                                                                                                         SUBJECTIVE STATEMENT: Patient reports that her recent exacerbation has improved.  She still having difficulty opening her mouth.  She feels like it is better without it first happened but she is still significantly limiting.   Eval: The patient has long history of jaw pain.  She has increased pain when chewing and opening.  She also clenches her teeth at night.  She has a bite guard.  She also has a long history of migraines.  She gets Botox  in her forehead.  Hand dominance: Right  PERTINENT HISTORY:  Anxiety: Arthritis in the feet: Migraines: Macular degeneration:  PAIN:  Are you having pain? Yes: NPRS scale: 5/10 7/20 at worst Pain location: Bilateral jaw Pain description: aching  Aggravating factors: chewing  Relieving factors: ice, massage   PRECAUTIONS: None  RED FLAGS: None     WEIGHT BEARING RESTRICTIONS: No  FALLS:  Has patient fallen in last 6 months? No  LIVING ENVIRONMENT: Nothing pertinent  OCCUPATION:  Teacher   PLOF: Independent  PATIENT GOALS:  To have less pain in her jaw  NEXT MD VISIT: Nothing scheduled  OBJECTIVE:  Note: Objective measures were completed at Evaluation unless otherwise noted.  DIAGNOSTIC FINDINGS:  Nothing of the jaw  PATIENT SURVEYS:  TMJ outcome measure   COGNITION: Overall cognitive status: Within functional limits for tasks assessed  SENSATION: WNL  POSTURE:  forward head  PALPATION: Significant tightness in upper traps and cervical spine    CERVICAL ROM:   Active ROM A/PROM (deg) eval  Flexion WNL  Extension WNL  Right lateral flexion   Left lateral flexion   Right  rotation 60  Left rotation 60   (Blank rows = not tested)  UPPER EXTREMITY ROM: Full active range of motion of shoulders   Patient has opening and closing shift to the right greater than 1 cm shift noted  Jaw opening 5.3 cm with pain at end range  11/7  4.3 cm   Still has J deviation     TREATMENT DATE:  11/7 Trigger Point Dry Needling  Initial Treatment: Pt instructed on Dry Needling rational, procedures, and possible side effects. Pt instructed to expect mild to moderate muscle soreness later in the day and/or into the next day.  Pt instructed in methods to reduce muscle soreness. Pt instructed to continue prescribed HEP. Because Dry Needling was performed over or adjacent to a lung field, pt was educated on S/S of pneumothorax and to seek immediate medical attention should they occur.  Patient was educated on signs and symptoms of infection and other risk factors and advised to seek medical attention should they occur.  Patient verbalized understanding of these instructions and education.   Patient Verbal Consent Given: Yes Education Handout Provided: Yes Muscles Treated: masseter bilateral 2x each side upper trap 3x left 2x right .30x50, right lateral Ptryegoid  Electrical Stimulation Performed: No Treatment Response/Outcome: great twitch with all   Manual: Trigger point release to upper traps/suboccipitals/masseter/cervical paraspinals Skilled palpation of trigger points Review of self soft tissue mobilization Trigger point release to levator area  Sub-occipital release    Neuro muscular reeducation: Shoulder extension 3x12 green  Scap retraction 3x12 green   Reviewed roccabados    10/22 Trigger Point Dry Needling  Initial Treatment:  Pt instructed on Dry Needling rational, procedures, and possible side effects. Pt instructed to expect mild to moderate muscle soreness later in the day and/or into the next day.  Pt instructed in methods to reduce muscle soreness. Pt instructed to continue prescribed HEP. Because Dry Needling was performed over or adjacent to a lung field, pt was educated on S/S of pneumothorax and to seek immediate medical attention should they occur.  Patient was educated on signs and symptoms of infection and other risk factors and advised to seek medical attention should they occur.  Patient verbalized understanding of these instructions and education.   Patient Verbal Consent Given: Yes Education Handout Provided: Yes Muscles Treated: masseter bilateral 2x each side upper trap 3x left 2x right .30x50, right lateral Ptryegoid  Electrical Stimulation Performed: No Treatment Response/Outcome: great twitch with all   Manual: Trigger point release to upper traps/suboccipitals/masseter/cervical paraspinals Skilled palpation of trigger points Review of self soft tissue mobilization Trigger point release to levator area  Sub-occipital release    10/15 Trigger Point Dry Needling  Initial Treatment: Pt instructed on Dry Needling rational, procedures, and possible side effects. Pt instructed to expect mild to moderate muscle soreness later in the day and/or into the next day.  Pt instructed in methods to reduce muscle soreness. Pt instructed to continue prescribed HEP. Because Dry Needling was performed over or adjacent to a lung field, pt was educated on S/S of pneumothorax and to seek immediate medical attention should they occur.  Patient was educated on signs and symptoms of infection and other risk factors and advised to seek medical attention should they occur.  Patient verbalized understanding of these instructions and education.   Patient Verbal Consent Given: Yes Education Handout Provided:  Yes Muscles Treated: masseter bilateral 2x each side upper trap 3x left 2x right .30x50 Electrical Stimulation Performed: No Treatment  Response/Outcome: great twitch with all   Manual: Trigger point release to upper traps/suboccipitals/masseter/cervical paraspinals Skilled palpation of trigger points Review of self soft tissue mobilization Trigger point release to levator area  Sub-occipital release   10/1   Trigger Point Dry Needling  Initial Treatment: Pt instructed on Dry Needling rational, procedures, and possible side effects. Pt instructed to expect mild to moderate muscle soreness later in the day and/or into the next day.  Pt instructed in methods to reduce muscle soreness. Pt instructed to continue prescribed HEP. Because Dry Needling was performed over or adjacent to a lung field, pt was educated on S/S of pneumothorax and to seek immediate medical attention should they occur.  Patient was educated on signs and symptoms of infection and other risk factors and advised to seek medical attention should they occur.  Patient verbalized understanding of these instructions and education.   Patient Verbal Consent Given: Yes Education Handout Provided: Yes Muscles Treated: masseter bilateral 2x each side upper trap 3x left 2x right .30x50 Lateral Ptyregoind 2x each side Electrical Stimulation Performed: No Treatment Response/Outcome: great twitch with all   Manual: Trigger point release to upper traps/suboccipitals/masseter/cervical paraspinals Skilled palpation of trigger points Review of self soft tissue mobilization Trigger point release to levator area      Last visit:  Neuro-re-ed:  Cable: Row 10 pounds RPE of 1 x 12 15 pounds RPE of six 2 x 12 Cable extension 10 pounds RPE of five 3 x 12  Reviewed how to use these exercises to prevent further exacerbations Eval:  Rocccabado exercises  Tongue clicking x 6 Controlled opening x 6 Chin tucks x 6 Isometric  deviation times five 5-second holds Isometric opening 5 x 5-second holds Row 2 x 10 green  Manual: Trigger point release to bilateral upper traps, cervical paraspinals, and suboccipitals Review of use of Thera cane to suboccipitals and upper traps                                                                                                                               PATIENT EDUCATION:  Education details: HEP, symptom management  Person educated: Patient Education method: Explanation, Demonstration, Tactile cues, Verbal cues, and Handouts Education comprehension: verbalized understanding, returned demonstration, verbal cues required, tactile cues required, and needs further education  HOME EXERCISE PROGRAM: Access Code: [email or web address] URL: [email or web address] Date: 08/26/2024 Prepared by: Alm Don  Exercises - Tongue Clicks TMJ  - 3 x daily - 7 x weekly - 1 sets - 6 reps - Isometric Jaw Deviation  - 3 x daily - 7 x weekly - 1 sets - 10 reps - Isometric Jaw Abduction  - 3 x daily - 7 x weekly - 1 sets - 10 reps - Seated Cervical Retraction  - 3 x daily - 7 x weekly - 1 sets - 10 reps - Scapular Retraction with Resistance  - 3 x daily - 7 x weekly - 2 sets -  10 reps - Jaw Opening  - 3 x daily - 7 x weekly - 3 sets - 10 reps  ASSESSMENT:  CLINICAL IMPRESSION: Reported significant improvement following manual therapy and trigger point dry needling to multiple muscle groups.  Prior to recent acute onset of jaw pain she had made significant progress.  She was back to eating without any significant pain.  She is able to open her jaw.  She has occasional clicking.  Since her exacerbation she is having more difficulty opening her jaw.  Since last visit she has improved but she is still not back to where she was before her recent exacerbation.  Overall she had been making progress.  Her opening is slightly limited today compared to initial eval.  She did have an improved  opening following trigger point dry needling.  We reviewed her exercises to work with.  She would benefit from further skilled therapy 1W8.  See below for goal specific progress. Eval: Patient is a 53 year old female with long history of bilateral jaw pain.  She has tenderness palpation in her masseter area bilateral upper traps, and cervical paraspinals on both sides.  She has a significant right shift with opening and closing.  She does have full opening but pain at end range end ranges.  She has increased muscle tension in her upper traps and cervical paraspinals.  She would benefit from skilled therapy to improve her ability to eat and perform daily activities without pain OBJECTIVE IMPAIRMENTS: Difficulty chewing difficulty yawning goal, pain muscle tightness.   ACTIVITY LIMITATIONS: Eating, yawning  PARTICIPATION LIMITATIONS:  PERSONAL FACTORS: 1 comorbidity: Anxiety, migraines are also affecting patient's functional outcome.   REHAB POTENTIAL: Good  CLINICAL DECISION MAKING: Stable/uncomplicated  EVALUATION COMPLEXITY: Low   GOALS: Goals reviewed with patient? Yes  SHORT TERM GOALS: Target date: 09/23/2024    Patient will demonstrate full jaqopening without bilateral jaw pain Baseline:  Goal status: This exacerbation 11/7  2.  Patient will show no deviation with opening and closing Baseline:  Goal status: Deviation had gone away but is back 11/7  3.  Patient will be independent with self soft tissue mobilization Baseline:  Goal status: Achieved 11/7  4.  Patient will perform Roccoddod with minimal cueing Baseline:  Goal status: Achieved achieved 11/7   LONG TERM GOALS: Target date: 10/21/2024    Patient will be independent with complete posterior chain strengthening HEP to prevent further exacerbation Baseline:  Goal status: Progressing 11/7  2.  Patient will chew food without pain Baseline:  Goal status: Was improving but now back to having pain 11/7  3.   Patient will have no pain carrying conversations Baseline:  Goal status: Had improved but now back to painful 11/7    PLAN:  PT FREQUENCY: 1x  PT DURATION: 8 weeks  PLANNED INTERVENTIONS: 97110-Therapeutic exercises, 97530- Therapeutic activity, 97112- Neuromuscular re-education, 97535- Self Care, [id number]- Manual therapy, 97116- Gait training, c Therapy, 97014- Electrical stimulation (unattended), 97035- Ultrasound, Patient/Family education, Stair training, Taping, Dry Needling, DME instructions, Cryotherapy, and Moist heat   PLAN FOR NEXT SESSION: Trigger point dry needling to masseter and lateral pterygoid, trigger point dry needling to upper traps and cervical paraspinals.  Soft tissue mobilization upper traps and cervical paraspinals as well as masseter.  Review rows and shoulder extensions.  Reviewed cervical retraction.  Review current HEP and troubleshoot.   Alm JINNY Don, PT 11/04/2024, 2:55 PM

## 2024-11-09 ENCOUNTER — Ambulatory Visit (HOSPITAL_BASED_OUTPATIENT_CLINIC_OR_DEPARTMENT_OTHER): Payer: Self-pay | Admitting: Physical Therapy

## 2024-11-09 ENCOUNTER — Encounter (HOSPITAL_BASED_OUTPATIENT_CLINIC_OR_DEPARTMENT_OTHER): Payer: Self-pay | Admitting: Physical Therapy

## 2024-11-09 DIAGNOSIS — R278 Other lack of coordination: Secondary | ICD-10-CM

## 2024-11-09 DIAGNOSIS — R293 Abnormal posture: Secondary | ICD-10-CM | POA: Diagnosis not present

## 2024-11-09 DIAGNOSIS — M62838 Other muscle spasm: Secondary | ICD-10-CM

## 2024-11-09 DIAGNOSIS — M6281 Muscle weakness (generalized): Secondary | ICD-10-CM

## 2024-11-09 NOTE — Therapy (Signed)
 OUTPATIENT PHYSICAL THERAPY CERVICAL EVALUATION   Patient Name: Shelly Wilkinson MRN: [id number] DOB:03-24-1971, 53 y.o., female Today's Date: 11/09/2024  END OF SESSION:  PT End of Session - 11/09/24 0811     Visit Number 7    Number of Visits 16    Date for Recertification  12/30/24    PT Start Time 0717    PT Stop Time 0804    PT Time Calculation (min) 47 min    Activity Tolerance Patient tolerated treatment well    Behavior During Therapy [facility] for tasks assessed/performed             Past Medical History:  Diagnosis Date   Allergy    Anemia, iron deficiency 08/25/2016   Anxiety    Arthritis    Choroidal nevus    Chronic anal fissure    Complication of anesthesia    headaches/   06-22-2014 surg. had urinary retention   Constipation due to outlet dysfunction    Dyssynergic constipation 10/01/2016   Anorectal mano   Episcleritis-uveitis? 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   Left sided ulcerative colitis (? Crohn's) with complication (HCC) 11/18/2016   Diagnosed fall 2017. Indeterminate probably. Rectal involvement but patchy small ulcers. Initially chewed with Lialda  and uceris  was added. Changed to Humira  May 2018. Prednisone  started April 2018. Improved.     07/2022 Yusimry  - biosimilar started   Macular degeneration    Migraines    Thrombosed hemorrhoids    Vitamin D  deficiency 11/19/2016   Wears glasses    Past Surgical History:  Procedure Laterality Date   ANAL RECTAL MANOMETRY N/A 09/17/2016   Procedure: ANO RECTAL MANOMETRY;  Surgeon: Lupita FORBES Commander, MD;  Location: [facility];  Service: Endoscopy;  Laterality: N/A;   CHEILECTOMY Right 12/15/2013   Procedure: RIGHT HALLUX CHEILECTOMY;  Surgeon: Norleen Armor, MD;  Location: [facility];  Service: Orthopedics;  Laterality: Right;   CHEILECTOMY Left 02-12-2006   great toe   COLONOSCOPY  10-02-2010   CYSTO/  HYDRODISTENTION/  INSTILLATION THERAPY  03-19-2010   D & C HYSTEROSCOPY /  HYDROTHERMAL ENDOMETRIAL ABLATION/  LEEP  05-29-2011   DILATION AND EVACUATION  2002   ESOPHAGOGASTRODUODENOSCOPY (EGD) WITH PROPOFOL  N/A 04/17/2022   Procedure: ESOPHAGOGASTRODUODENOSCOPY (EGD) WITH PROPOFOL ;  Surgeon: Teressa Toribio SQUIBB, MD;  Location: [facility];  Service: Gastroenterology;  Laterality: N/A;   EUS N/A 04/17/2022   Procedure: UPPER ENDOSCOPIC ULTRASOUND (EUS) RADIAL;  Surgeon: Teressa Toribio SQUIBB, MD;  Location: [facility];  Service: Gastroenterology;  Laterality: N/A;   EVALUATION UNDER ANESTHESIA WITH ANAL FISTULECTOMY N/A 12/26/2014   Procedure: EXAM UNDER ANESTHESIA ;  Surgeon: Bernarda Ned, MD;  Location: [facility];  Service: General;  Laterality: N/A;   HEMORRHOID BANDING  02-16-2014   REPAIR LACERATED RADIAL DIGITAL NERVE LEFT RING FINGER  08-25-2005   SPHINCTEROTOMY N/A 12/26/2014   Procedure: CHEMICAL SPHINCTEROTOMY (BOTOX );  Surgeon: Bernarda Ned, MD;  Location: [facility];  Service: General;  Laterality: N/A;   TRANSANAL HEMORRHOIDAL DEARTERIALIZATION  06-22-2014   Patient Active Problem List   Diagnosis Date Noted   Abnormal transaminases 07/03/2024   Osteopenia after menopause 12/17/2023   Chronic migraine without aura without status migrainosus, not intractable 09/12/2022   Abnormal CT scan, stomach  Hyperinsulinemic hypoglycemia 02/18/2022   Hypoglycemia 12/10/2021   Muscle spasm 11/30/2020   Abnormal mammogram of right breast 05/03/2018   Long-term use of immunosuppressant medication - Humira  06/16/2017   Vitamin D  deficiency 11/19/2016   Left sided ulcerative colitis (? Crohn's) with complication (HCC) 11/18/2016   Anemia, iron deficiency 08/25/2016   Prolapsed internal hemorrhoids, grade 3 02/27/2016    Interstitial cystitis 10/19/2012   Migraine headache 10/19/2012    PCP: Trula Brim MD  REFERRING PROVIDER: Latrelle Holt PA  REFERRING DIAG:  TMJ  THERAPY DIAG:  Abnormal posture  Other muscle spasm  Muscle weakness (generalized)  Other lack of coordination  Rationale for Evaluation and Treatment: Rehabilitation  ONSET DATE:    SUBJECTIVE:                                                                                                                                                                                                         SUBJECTIVE STATEMENT: The patient reports it has been up and down. Some days she doesn't have pain. She has other times where it throbs.    Eval: The patient has long history of jaw pain.  She has increased pain when chewing and opening.  She also clenches her teeth at night.  She has a bite guard.  She also has a long history of migraines.  She gets Botox  in her forehead.  Hand dominance: Right  PERTINENT HISTORY:  Anxiety: Arthritis in the feet: Migraines: Macular degeneration:  PAIN:  Are you having pain? Yes: NPRS scale: 5/10 7/20 at worst Pain location: Bilateral jaw Pain description: aching  Aggravating factors: chewing  Relieving factors: ice, massage   PRECAUTIONS: None  RED FLAGS: None     WEIGHT BEARING RESTRICTIONS: No  FALLS:  Has patient fallen in last 6 months? No  LIVING ENVIRONMENT: Nothing pertinent  OCCUPATION:  Teacher   PLOF: Independent  PATIENT GOALS:  To have less pain in her jaw  NEXT MD VISIT: Nothing scheduled  OBJECTIVE:  Note: Objective measures were completed at Evaluation unless otherwise noted.  DIAGNOSTIC FINDINGS:  Nothing of the jaw  PATIENT SURVEYS:  TMJ outcome measure   COGNITION: Overall cognitive status: Within functional limits for tasks assessed  SENSATION: WNL  POSTURE: forward head  PALPATION: Significant tightness in upper traps and cervical spine     CERVICAL ROM:   Active ROM A/PROM (deg) eval  Flexion WNL  Extension WNL  Right lateral flexion   Left lateral flexion   Right rotation 60  Left rotation 60   (Blank rows =  not tested)  UPPER EXTREMITY ROM: Full active range of motion of shoulders   Patient has opening and closing shift to the right greater than 1 cm shift noted  Jaw opening 5.3 cm with pain at end range  11/7  4.3 cm   Still has J deviation     TREATMENT DATE:  11/12 Trigger Point Dry Needling  Initial Treatment: Pt instructed on Dry Needling rational, procedures, and possible side effects. Pt instructed to expect mild to moderate muscle soreness later in the day and/or into the next day.  Pt instructed in methods to reduce muscle soreness. Pt instructed to continue prescribed HEP. Because Dry Needling was performed over or adjacent to a lung field, pt was educated on S/S of pneumothorax and to seek immediate medical attention should they occur.  Patient was educated on signs and symptoms of infection and other risk factors and advised to seek medical attention should they occur.  Patient verbalized understanding of these instructions and education.   Patient Verbal Consent Given: Yes Education Handout Provided: Yes Muscles Treated: masseter bilateral 2x each side upper trap 3x left 2x right .30x50, right lateral Ptryegoid  Electrical Stimulation Performed: No Treatment Response/Outcome: great twitch with all   Manual: Trigger point release to upper traps/suboccipitals/masseter/cervical paraspinals Skilled palpation of trigger points Review of self soft tissue mobilization Trigger point release to levator area  Sub-occipital release  11/7 Trigger Point Dry Needling  Initial Treatment: Pt instructed on Dry Needling rational, procedures, and possible side effects. Pt instructed to expect mild to moderate muscle soreness later in the day and/or into the next day.  Pt instructed in methods  to reduce muscle soreness. Pt instructed to continue prescribed HEP. Because Dry Needling was performed over or adjacent to a lung field, pt was educated on S/S of pneumothorax and to seek immediate medical attention should they occur.  Patient was educated on signs and symptoms of infection and other risk factors and advised to seek medical attention should they occur.  Patient verbalized understanding of these instructions and education.   Patient Verbal Consent Given: Yes Education Handout Provided: Yes Muscles Treated: masseter bilateral 2x each side upper trap 3x left 2x right .30x50, right lateral Ptryegoid  Electrical Stimulation Performed: No Treatment Response/Outcome: great twitch with all   Manual: Trigger point release to upper traps/suboccipitals/masseter/cervical paraspinals Skilled palpation of trigger points Review of self soft tissue mobilization Trigger point release to levator area  Sub-occipital release    Neuro muscular reeducation: Shoulder extension 3x12 green  Scap retraction 3x12 green   Reviewed roccabados    10/22 Trigger Point Dry Needling  Initial Treatment: Pt instructed on Dry Needling rational, procedures, and possible side effects. Pt instructed to expect mild to moderate muscle soreness later in the day and/or into the next day.  Pt instructed in methods to reduce muscle soreness. Pt instructed to continue prescribed HEP. Because Dry Needling was performed over or adjacent to a lung field, pt was educated on S/S of pneumothorax and to seek immediate medical attention should they occur.  Patient was educated on signs and symptoms of infection and other risk factors and advised to seek medical attention should they occur.  Patient verbalized understanding of these instructions and education.   Patient Verbal Consent Given: Yes Education Handout Provided: Yes Muscles Treated: masseter bilateral 2x each side upper trap 3x left 2x right .30x50,  right lateral Ptryegoid  Electrical Stimulation Performed: No Treatment Response/Outcome: great twitch with all   Manual: Trigger  point release to upper traps/suboccipitals/masseter/cervical paraspinals Skilled palpation of trigger points Review of self soft tissue mobilization Trigger point release to levator area  Sub-occipital release    10/15 Trigger Point Dry Needling  Initial Treatment: Pt instructed on Dry Needling rational, procedures, and possible side effects. Pt instructed to expect mild to moderate muscle soreness later in the day and/or into the next day.  Pt instructed in methods to reduce muscle soreness. Pt instructed to continue prescribed HEP. Because Dry Needling was performed over or adjacent to a lung field, pt was educated on S/S of pneumothorax and to seek immediate medical attention should they occur.  Patient was educated on signs and symptoms of infection and other risk factors and advised to seek medical attention should they occur.  Patient verbalized understanding of these instructions and education.   Patient Verbal Consent Given: Yes Education Handout Provided: Yes Muscles Treated: masseter bilateral 2x each side upper trap 3x left 2x right .30x50 Electrical Stimulation Performed: No Treatment Response/Outcome: great twitch with all   Manual: Trigger point release to upper traps/suboccipitals/masseter/cervical paraspinals Skilled palpation of trigger points Review of self soft tissue mobilization Trigger point release to levator area  Sub-occipital release                                                                                                                              PATIENT EDUCATION:  Education details: HEP, symptom management  Person educated: Patient Education method: Explanation, Demonstration, Tactile cues, Verbal cues, and Handouts Education comprehension: verbalized understanding, returned demonstration, verbal cues  required, tactile cues required, and needs further education  HOME EXERCISE PROGRAM: Access Code: [email or web address] URL: [email or web address] Date: 08/26/2024 Prepared by: Alm Don  Exercises - Tongue Clicks TMJ  - 3 x daily - 7 x weekly - 1 sets - 6 reps - Isometric Jaw Deviation  - 3 x daily - 7 x weekly - 1 sets - 10 reps - Isometric Jaw Abduction  - 3 x daily - 7 x weekly - 1 sets - 10 reps - Seated Cervical Retraction  - 3 x daily - 7 x weekly - 1 sets - 10 reps - Scapular Retraction with Resistance  - 3 x daily - 7 x weekly - 2 sets - 10 reps - Jaw Opening  - 3 x daily - 7 x weekly - 3 sets - 10 reps  ASSESSMENT:  CLINICAL IMPRESSION: The patient is making some progress. We worked on the left and right side today We had been focusing on the right side. She had a great twitch with all muscles. We reviewed the exercises for her to continue. We will continue to progress posterior chain exercises as tolerated.    Eval: Patient is a 53 year old female with long history of bilateral jaw pain.  She has tenderness palpation in her masseter area bilateral upper traps, and cervical paraspinals on both sides.  She has a significant right shift with opening and closing.  She does have full opening but pain at end range end ranges.  She has increased muscle tension in her upper traps and cervical paraspinals.  She would benefit from skilled therapy to improve her ability to eat and perform daily activities without pain OBJECTIVE IMPAIRMENTS: Difficulty chewing difficulty yawning goal, pain muscle tightness.   ACTIVITY LIMITATIONS: Eating, yawning  PARTICIPATION LIMITATIONS:  PERSONAL FACTORS: 1 comorbidity: Anxiety, migraines are also affecting patient's functional outcome.   REHAB POTENTIAL: Good  CLINICAL DECISION MAKING: Stable/uncomplicated  EVALUATION COMPLEXITY: Low   GOALS: Goals reviewed with patient? Yes  SHORT TERM GOALS: Target date: 09/23/2024    Patient  will demonstrate full jaqopening without bilateral jaw pain Baseline:  Goal status: This exacerbation 11/7  2.  Patient will show no deviation with opening and closing Baseline:  Goal status: Deviation had gone away but is back 11/7  3.  Patient will be independent with self soft tissue mobilization Baseline:  Goal status: Achieved 11/7  4.  Patient will perform Roccoddod with minimal cueing Baseline:  Goal status: Achieved achieved 11/7   LONG TERM GOALS: Target date: 10/21/2024    Patient will be independent with complete posterior chain strengthening HEP to prevent further exacerbation Baseline:  Goal status: Progressing 11/7  2.  Patient will chew food without pain Baseline:  Goal status: Was improving but now back to having pain 11/7  3.  Patient will have no pain carrying conversations Baseline:  Goal status: Had improved but now back to painful 11/7    PLAN:  PT FREQUENCY: 1x  PT DURATION: 8 weeks  PLANNED INTERVENTIONS: 97110-Therapeutic exercises, 97530- Therapeutic activity, 97112- Neuromuscular re-education, 97535- Self Care, [id number]- Manual therapy, 97116- Gait training, c Therapy, 97014- Electrical stimulation (unattended), 97035- Ultrasound, Patient/Family education, Stair training, Taping, Dry Needling, DME instructions, Cryotherapy, and Moist heat   PLAN FOR NEXT SESSION: Trigger point dry needling to masseter and lateral pterygoid, trigger point dry needling to upper traps and cervical paraspinals.  Soft tissue mobilization upper traps and cervical paraspinals as well as masseter.  Review rows and shoulder extensions.  Reviewed cervical retraction.  Review current HEP and troubleshoot.   Alm JINNY Don, PT 11/09/2024, 2:01 PM

## 2024-11-14 ENCOUNTER — Other Ambulatory Visit: Payer: Self-pay | Admitting: *Deleted

## 2024-11-14 MED ORDER — BOTOX 200 UNITS IJ SOLR: 200.0000 [IU] | INTRAMUSCULAR | 2 refills | Status: AC

## 2024-11-21 ENCOUNTER — Other Ambulatory Visit

## 2024-11-21 DIAGNOSIS — K5090 Crohn's disease, unspecified, without complications: Secondary | ICD-10-CM

## 2024-11-25 LAB — CALPROTECTIN, FECAL: Calprotectin, Fecal: 6 ug/g (ref 0–120)

## 2024-11-28 ENCOUNTER — Encounter (HOSPITAL_BASED_OUTPATIENT_CLINIC_OR_DEPARTMENT_OTHER): Payer: Self-pay | Admitting: Physical Therapy

## 2024-11-28 ENCOUNTER — Ambulatory Visit (HOSPITAL_BASED_OUTPATIENT_CLINIC_OR_DEPARTMENT_OTHER): Attending: Physician Assistant | Admitting: Physical Therapy

## 2024-11-28 DIAGNOSIS — R293 Abnormal posture: Secondary | ICD-10-CM | POA: Diagnosis present

## 2024-11-28 DIAGNOSIS — M62838 Other muscle spasm: Secondary | ICD-10-CM | POA: Diagnosis present

## 2024-11-28 DIAGNOSIS — R278 Other lack of coordination: Secondary | ICD-10-CM | POA: Insufficient documentation

## 2024-11-28 DIAGNOSIS — M6281 Muscle weakness (generalized): Secondary | ICD-10-CM | POA: Insufficient documentation

## 2024-11-28 NOTE — Therapy (Signed)
 OUTPATIENT PHYSICAL THERAPY CERVICAL EVALUATION   Patient Name: Shelly Wilkinson MRN: [id number] DOB:1971-10-04, 53 y.o., female Today's Date: 11/28/2024  END OF SESSION:  PT End of Session - 11/28/24 1410     Visit Number 8    Number of Visits 16    Date for Recertification  12/30/24    PT Start Time 1230    PT Stop Time 1312    PT Time Calculation (min) 42 min    Activity Tolerance Patient tolerated treatment well    Behavior During Therapy [facility] for tasks assessed/performed             Past Medical History:  Diagnosis Date   Allergy    Anemia, iron deficiency 08/25/2016   Anxiety    Arthritis    Choroidal nevus    Chronic anal fissure    Complication of anesthesia    headaches/   06-22-2014 surg. had urinary retention   Constipation due to outlet dysfunction    Dyssynergic constipation 10/01/2016   Anorectal mano   Episcleritis-uveitis? 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   Left sided ulcerative colitis (? Crohn's) with complication (HCC) 11/18/2016   Diagnosed fall 2017. Indeterminate probably. Rectal involvement but patchy small ulcers. Initially chewed with Lialda  and uceris  was added. Changed to Humira  May 2018. Prednisone  started April 2018. Improved.     07/2022 Yusimry  - biosimilar started   Macular degeneration    Migraines    Thrombosed hemorrhoids    Vitamin D  deficiency 11/19/2016   Wears glasses    Past Surgical History:  Procedure Laterality Date   ANAL RECTAL MANOMETRY N/A 09/17/2016   Procedure: ANO RECTAL MANOMETRY;  Surgeon: Lupita FORBES Commander, MD;  Location: [facility];  Service: Endoscopy;  Laterality: N/A;   CHEILECTOMY Right 12/15/2013   Procedure: RIGHT HALLUX CHEILECTOMY;  Surgeon: Norleen Armor, MD;  Location: [facility];  Service: Orthopedics;  Laterality: Right;   CHEILECTOMY Left 02-12-2006   great toe   COLONOSCOPY  10-02-2010   CYSTO/  HYDRODISTENTION/  INSTILLATION THERAPY  03-19-2010   D & C HYSTEROSCOPY /  HYDROTHERMAL ENDOMETRIAL ABLATION/  LEEP  05-29-2011   DILATION AND EVACUATION  2002   ESOPHAGOGASTRODUODENOSCOPY (EGD) WITH PROPOFOL  N/A 04/17/2022   Procedure: ESOPHAGOGASTRODUODENOSCOPY (EGD) WITH PROPOFOL ;  Surgeon: Teressa Toribio SQUIBB, MD;  Location: [facility];  Service: Gastroenterology;  Laterality: N/A;   EUS N/A 04/17/2022   Procedure: UPPER ENDOSCOPIC ULTRASOUND (EUS) RADIAL;  Surgeon: Teressa Toribio SQUIBB, MD;  Location: [facility];  Service: Gastroenterology;  Laterality: N/A;   EVALUATION UNDER ANESTHESIA WITH ANAL FISTULECTOMY N/A 12/26/2014   Procedure: EXAM UNDER ANESTHESIA ;  Surgeon: Bernarda Ned, MD;  Location: [facility];  Service: General;  Laterality: N/A;   HEMORRHOID BANDING  02-16-2014   REPAIR LACERATED RADIAL DIGITAL NERVE LEFT RING FINGER  08-25-2005   SPHINCTEROTOMY N/A 12/26/2014   Procedure: CHEMICAL SPHINCTEROTOMY (BOTOX );  Surgeon: Bernarda Ned, MD;  Location: [facility];  Service: General;  Laterality: N/A;   TRANSANAL HEMORRHOIDAL DEARTERIALIZATION  06-22-2014   Patient Active Problem List   Diagnosis Date Noted   Abnormal transaminases 07/03/2024   Osteopenia after menopause 12/17/2023   Chronic migraine without aura without status migrainosus, not intractable 09/12/2022   Abnormal CT scan, stomach  Hyperinsulinemic hypoglycemia 02/18/2022   Hypoglycemia 12/10/2021   Muscle spasm 11/30/2020   Abnormal mammogram of right breast 05/03/2018   Long-term use of immunosuppressant medication - Humira  06/16/2017   Vitamin D  deficiency 11/19/2016   Left sided ulcerative colitis (? Crohn's) with complication (HCC) 11/18/2016   Anemia, iron deficiency 08/25/2016   Prolapsed internal hemorrhoids, grade 3 02/27/2016    Interstitial cystitis 10/19/2012   Migraine headache 10/19/2012    PCP: Trula Brim MD  REFERRING PROVIDER: Latrelle Holt PA  REFERRING DIAG:  TMJ  THERAPY DIAG:  Abnormal posture  Other muscle spasm  Muscle weakness (generalized)  Other lack of coordination  Rationale for Evaluation and Treatment: Rehabilitation  ONSET DATE:    SUBJECTIVE:                                                                                                                                                                                                         SUBJECTIVE STATEMENT: The patient reports it has been up and down. Some days she doesn't have pain. She has other times where it throbs.    Eval: The patient has long history of jaw pain.  She has increased pain when chewing and opening.  She also clenches her teeth at night.  She has a bite guard.  She also has a long history of migraines.  She gets Botox  in her forehead.  Hand dominance: Right  PERTINENT HISTORY:  Anxiety: Arthritis in the feet: Migraines: Macular degeneration:  PAIN:  Are you having pain? Yes: NPRS scale: 5/10 7/20 at worst Pain location: Bilateral jaw Pain description: aching  Aggravating factors: chewing  Relieving factors: ice, massage   PRECAUTIONS: None  RED FLAGS: None     WEIGHT BEARING RESTRICTIONS: No  FALLS:  Has patient fallen in last 6 months? No  LIVING ENVIRONMENT: Nothing pertinent  OCCUPATION:  Teacher   PLOF: Independent  PATIENT GOALS:  To have less pain in her jaw  NEXT MD VISIT: Nothing scheduled  OBJECTIVE:  Note: Objective measures were completed at Evaluation unless otherwise noted.  DIAGNOSTIC FINDINGS:  Nothing of the jaw  PATIENT SURVEYS:  TMJ outcome measure   COGNITION: Overall cognitive status: Within functional limits for tasks assessed  SENSATION: WNL  POSTURE: forward head  PALPATION: Significant tightness in upper traps and cervical spine     CERVICAL ROM:   Active ROM A/PROM (deg) eval  Flexion WNL  Extension WNL  Right lateral flexion   Left lateral flexion   Right rotation 60  Left rotation 60   (Blank rows =  not tested)  UPPER EXTREMITY ROM: Full active range of motion of shoulders   Patient has opening and closing shift to the right greater than 1 cm shift noted  Jaw opening 5.3 cm with pain at end range  11/7  4.3 cm   Still has J deviation     TREATMENT DATE:  11/12 Trigger Point Dry Needling  Initial Treatment: Pt instructed on Dry Needling rational, procedures, and possible side effects. Pt instructed to expect mild to moderate muscle soreness later in the day and/or into the next day.  Pt instructed in methods to reduce muscle soreness. Pt instructed to continue prescribed HEP. Because Dry Needling was performed over or adjacent to a lung field, pt was educated on S/S of pneumothorax and to seek immediate medical attention should they occur.  Patient was educated on signs and symptoms of infection and other risk factors and advised to seek medical attention should they occur.  Patient verbalized understanding of these instructions and education.   Patient Verbal Consent Given: Yes Education Handout Provided: Yes Muscles Treated: masseter bilateral 2x each side upper trap 3x left 2x right .30x50, right lateral Ptryegoid  Electrical Stimulation Performed: No Treatment Response/Outcome: great twitch with all   Manual: Trigger point release to upper traps/suboccipitals/masseter/cervical paraspinals Skilled palpation of trigger points Review of self soft tissue mobilization Trigger point release to levator area  Sub-occipital release  11/7 Trigger Point Dry Needling  Initial Treatment: Pt instructed on Dry Needling rational, procedures, and possible side effects. Pt instructed to expect mild to moderate muscle soreness later in the day and/or into the next day.  Pt instructed in methods  to reduce muscle soreness. Pt instructed to continue prescribed HEP. Because Dry Needling was performed over or adjacent to a lung field, pt was educated on S/S of pneumothorax and to seek immediate medical attention should they occur.  Patient was educated on signs and symptoms of infection and other risk factors and advised to seek medical attention should they occur.  Patient verbalized understanding of these instructions and education.   Patient Verbal Consent Given: Yes Education Handout Provided: Yes Muscles Treated: masseter bilateral 2x each side upper trap 3x left 2x right .30x50, right lateral Ptryegoid  Electrical Stimulation Performed: No Treatment Response/Outcome: great twitch with all   Manual: Trigger point release to upper traps/suboccipitals/masseter/cervical paraspinals Skilled palpation of trigger points Review of self soft tissue mobilization Trigger point release to levator area  Sub-occipital release    Neuro muscular reeducation: Shoulder extension 3x12 green  Scap retraction 3x12 green   Reviewed roccabados    10/22 Trigger Point Dry Needling  Initial Treatment: Pt instructed on Dry Needling rational, procedures, and possible side effects. Pt instructed to expect mild to moderate muscle soreness later in the day and/or into the next day.  Pt instructed in methods to reduce muscle soreness. Pt instructed to continue prescribed HEP. Because Dry Needling was performed over or adjacent to a lung field, pt was educated on S/S of pneumothorax and to seek immediate medical attention should they occur.  Patient was educated on signs and symptoms of infection and other risk factors and advised to seek medical attention should they occur.  Patient verbalized understanding of these instructions and education.   Patient Verbal Consent Given: Yes Education Handout Provided: Yes Muscles Treated: masseter bilateral 2x each side upper trap 3x left 2x right .30x50,  right lateral Ptryegoid  Electrical Stimulation Performed: No Treatment Response/Outcome: great twitch with all   Manual: Trigger  point release to upper traps/suboccipitals/masseter/cervical paraspinals Skilled palpation of trigger points Review of self soft tissue mobilization Trigger point release to levator area  Sub-occipital release    10/15 Trigger Point Dry Needling  Initial Treatment: Pt instructed on Dry Needling rational, procedures, and possible side effects. Pt instructed to expect mild to moderate muscle soreness later in the day and/or into the next day.  Pt instructed in methods to reduce muscle soreness. Pt instructed to continue prescribed HEP. Because Dry Needling was performed over or adjacent to a lung field, pt was educated on S/S of pneumothorax and to seek immediate medical attention should they occur.  Patient was educated on signs and symptoms of infection and other risk factors and advised to seek medical attention should they occur.  Patient verbalized understanding of these instructions and education.   Patient Verbal Consent Given: Yes Education Handout Provided: Yes Muscles Treated: masseter bilateral 2x each side upper trap 3x left 2x right .30x50 Electrical Stimulation Performed: No Treatment Response/Outcome: great twitch with all   Manual: Trigger point release to upper traps/suboccipitals/masseter/cervical paraspinals Skilled palpation of trigger points Review of self soft tissue mobilization Trigger point release to levator area  Sub-occipital release                                                                                                                              PATIENT EDUCATION:  Education details: HEP, symptom management  Person educated: Patient Education method: Explanation, Demonstration, Tactile cues, Verbal cues, and Handouts Education comprehension: verbalized understanding, returned demonstration, verbal cues  required, tactile cues required, and needs further education  HOME EXERCISE PROGRAM: Access Code: [email or web address] URL: [email or web address] Date: 08/26/2024 Prepared by: Alm Don  Exercises - Tongue Clicks TMJ  - 3 x daily - 7 x weekly - 1 sets - 6 reps - Isometric Jaw Deviation  - 3 x daily - 7 x weekly - 1 sets - 10 reps - Isometric Jaw Abduction  - 3 x daily - 7 x weekly - 1 sets - 10 reps - Seated Cervical Retraction  - 3 x daily - 7 x weekly - 1 sets - 10 reps - Scapular Retraction with Resistance  - 3 x daily - 7 x weekly - 2 sets - 10 reps - Jaw Opening  - 3 x daily - 7 x weekly - 3 sets - 10 reps  ASSESSMENT:  CLINICAL IMPRESSION: The patient is making some progress. We worked on the left and right side today We had been focusing on the right side. She had a great twitch with all muscles. We reviewed the exercises for her to continue. We will continue to progress posterior chain exercises as tolerated.    Eval: Patient is a 53 year old female with long history of bilateral jaw pain.  She has tenderness palpation in her masseter area bilateral upper traps, and cervical paraspinals on both sides.  She has a significant right shift with opening and closing.  She does have full opening but pain at end range end ranges.  She has increased muscle tension in her upper traps and cervical paraspinals.  She would benefit from skilled therapy to improve her ability to eat and perform daily activities without pain OBJECTIVE IMPAIRMENTS: Difficulty chewing difficulty yawning goal, pain muscle tightness.   ACTIVITY LIMITATIONS: Eating, yawning  PARTICIPATION LIMITATIONS:  PERSONAL FACTORS: 1 comorbidity: Anxiety, migraines are also affecting patient's functional outcome.   REHAB POTENTIAL: Good  CLINICAL DECISION MAKING: Stable/uncomplicated  EVALUATION COMPLEXITY: Low   GOALS: Goals reviewed with patient? Yes  SHORT TERM GOALS: Target date: 09/23/2024    Patient  will demonstrate full jaqopening without bilateral jaw pain Baseline:  Goal status: This exacerbation 11/7  2.  Patient will show no deviation with opening and closing Baseline:  Goal status: Deviation had gone away but is back 11/7  3.  Patient will be independent with self soft tissue mobilization Baseline:  Goal status: Achieved 11/7  4.  Patient will perform Roccoddod with minimal cueing Baseline:  Goal status: Achieved achieved 11/7   LONG TERM GOALS: Target date: 10/21/2024    Patient will be independent with complete posterior chain strengthening HEP to prevent further exacerbation Baseline:  Goal status: Progressing 11/7  2.  Patient will chew food without pain Baseline:  Goal status: Was improving but now back to having pain 11/7  3.  Patient will have no pain carrying conversations Baseline:  Goal status: Had improved but now back to painful 11/7    PLAN:  PT FREQUENCY: 1x  PT DURATION: 8 weeks  PLANNED INTERVENTIONS: 97110-Therapeutic exercises, 97530- Therapeutic activity, 97112- Neuromuscular re-education, 97535- Self Care, [id number]- Manual therapy, 97116- Gait training, c Therapy, 97014- Electrical stimulation (unattended), 97035- Ultrasound, Patient/Family education, Stair training, Taping, Dry Needling, DME instructions, Cryotherapy, and Moist heat   PLAN FOR NEXT SESSION: Trigger point dry needling to masseter and lateral pterygoid, trigger point dry needling to upper traps and cervical paraspinals.  Soft tissue mobilization upper traps and cervical paraspinals as well as masseter.  Review rows and shoulder extensions.  Reviewed cervical retraction.  Review current HEP and troubleshoot.   Alm JINNY Don, PT 11/28/2024, 2:13 PM

## 2024-11-29 ENCOUNTER — Encounter (HOSPITAL_BASED_OUTPATIENT_CLINIC_OR_DEPARTMENT_OTHER): Payer: Self-pay | Admitting: Physical Therapy

## 2024-12-05 ENCOUNTER — Encounter (HOSPITAL_BASED_OUTPATIENT_CLINIC_OR_DEPARTMENT_OTHER): Admitting: Physical Therapy

## 2024-12-06 ENCOUNTER — Telehealth: Payer: Self-pay | Admitting: Physician Assistant

## 2024-12-06 NOTE — Telephone Encounter (Signed)
 Myles with [facility] called and stated that they will not be receiving the pts Botox  until today and we will be receiving it on Thursday 12/08/2024.

## 2024-12-12 ENCOUNTER — Ambulatory Visit (HOSPITAL_BASED_OUTPATIENT_CLINIC_OR_DEPARTMENT_OTHER): Admitting: Physical Therapy

## 2024-12-12 DIAGNOSIS — M62838 Other muscle spasm: Secondary | ICD-10-CM

## 2024-12-12 DIAGNOSIS — R278 Other lack of coordination: Secondary | ICD-10-CM

## 2024-12-12 DIAGNOSIS — R293 Abnormal posture: Secondary | ICD-10-CM

## 2024-12-12 DIAGNOSIS — M6281 Muscle weakness (generalized): Secondary | ICD-10-CM

## 2024-12-12 NOTE — Therapy (Signed)
 OUTPATIENT PHYSICAL THERAPY CERVICAL EVALUATION   Patient Name: Shelly Wilkinson MRN: [id number] DOB:1971/06/24, 53 y.o., female Today's Date: 12/13/2024  END OF SESSION:  PT End of Session - 12/13/24 1310     Visit Number 9    Number of Visits 16    Date for Recertification  12/30/24    PT Start Time 1321    PT Stop Time 1404    PT Time Calculation (min) 43 min    Activity Tolerance Patient tolerated treatment well    Behavior During Therapy [facility] for tasks assessed/performed              Past Medical History:  Diagnosis Date   Allergy    Anemia, iron deficiency 08/25/2016   Anxiety    Arthritis    Choroidal nevus    Chronic anal fissure    Complication of anesthesia    headaches/   06-22-2014 surg. had urinary retention   Constipation due to outlet dysfunction    Dyssynergic constipation 10/01/2016   Anorectal mano   Episcleritis-uveitis? 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   Left sided ulcerative colitis (? Crohn's) with complication (HCC) 11/18/2016   Diagnosed fall 2017. Indeterminate probably. Rectal involvement but patchy small ulcers. Initially chewed with Lialda  and uceris  was added. Changed to Humira  May 2018. Prednisone  started April 2018. Improved.     07/2022 Yusimry  - biosimilar started   Macular degeneration    Migraines    Thrombosed hemorrhoids    Vitamin D  deficiency 11/19/2016   Wears glasses    Past Surgical History:  Procedure Laterality Date   ANAL RECTAL MANOMETRY N/A 09/17/2016   Procedure: ANO RECTAL MANOMETRY;  Surgeon: Lupita FORBES Commander, MD;  Location: [facility];  Service: Endoscopy;  Laterality: N/A;   CHEILECTOMY Right 12/15/2013   Procedure: RIGHT HALLUX CHEILECTOMY;  Surgeon: Norleen Armor, MD;  Location: [facility];  Service: Orthopedics;  Laterality: Right;   CHEILECTOMY Left 02-12-2006   great toe   COLONOSCOPY  10-02-2010   CYSTO/  HYDRODISTENTION/  INSTILLATION THERAPY  03-19-2010   D & C HYSTEROSCOPY /  HYDROTHERMAL ENDOMETRIAL ABLATION/  LEEP  05-29-2011   DILATION AND EVACUATION  2002   ESOPHAGOGASTRODUODENOSCOPY (EGD) WITH PROPOFOL  N/A 04/17/2022   Procedure: ESOPHAGOGASTRODUODENOSCOPY (EGD) WITH PROPOFOL ;  Surgeon: Teressa Toribio SQUIBB, MD;  Location: [facility];  Service: Gastroenterology;  Laterality: N/A;   EUS N/A 04/17/2022   Procedure: UPPER ENDOSCOPIC ULTRASOUND (EUS) RADIAL;  Surgeon: Teressa Toribio SQUIBB, MD;  Location: [facility];  Service: Gastroenterology;  Laterality: N/A;   EVALUATION UNDER ANESTHESIA WITH ANAL FISTULECTOMY N/A 12/26/2014   Procedure: EXAM UNDER ANESTHESIA ;  Surgeon: Bernarda Ned, MD;  Location: [facility];  Service: General;  Laterality: N/A;   HEMORRHOID BANDING  02-16-2014   REPAIR LACERATED RADIAL DIGITAL NERVE LEFT RING FINGER  08-25-2005   SPHINCTEROTOMY N/A 12/26/2014   Procedure: CHEMICAL SPHINCTEROTOMY (BOTOX );  Surgeon: Bernarda Ned, MD;  Location: [facility];  Service: General;  Laterality: N/A;   TRANSANAL HEMORRHOIDAL DEARTERIALIZATION  06-22-2014   Patient Active Problem List   Diagnosis Date Noted   Abnormal transaminases 07/03/2024   Osteopenia after menopause 12/17/2023   Chronic migraine without aura without status migrainosus, not intractable 09/12/2022   Abnormal CT scan,  stomach    Hyperinsulinemic hypoglycemia 02/18/2022   Hypoglycemia 12/10/2021   Muscle spasm 11/30/2020   Abnormal mammogram of right breast 05/03/2018   Long-term use of immunosuppressant medication - Humira  06/16/2017   Vitamin D  deficiency 11/19/2016   Left sided ulcerative colitis (? Crohn's) with complication (HCC) 11/18/2016   Anemia, iron deficiency 08/25/2016   Prolapsed internal hemorrhoids, grade 3 02/27/2016    Interstitial cystitis 10/19/2012   Migraine headache 10/19/2012    PCP: Trula Brim MD  REFERRING PROVIDER: Latrelle Holt PA  REFERRING DIAG:  TMJ  THERAPY DIAG:  No diagnosis found.  Rationale for Evaluation and Treatment: Rehabilitation  ONSET DATE:    SUBJECTIVE:                                                                                                                                                                                                         SUBJECTIVE STATEMENT: The patient continues to report improvement. She is eating more without pain. She has been better about the exercises. Eval: The patient has long history of jaw pain.  She has increased pain when chewing and opening.  She also clenches her teeth at night.  She has a bite guard.  She also has a long history of migraines.  She gets Botox  in her forehead.  Hand dominance: Right  PERTINENT HISTORY:  Anxiety: Arthritis in the feet: Migraines: Macular degeneration:  PAIN:  Are you having pain? Yes: NPRS scale: 5/10 7/20 at worst Pain location: Bilateral jaw Pain description: aching  Aggravating factors: chewing  Relieving factors: ice, massage   PRECAUTIONS: None  RED FLAGS: None     WEIGHT BEARING RESTRICTIONS: No  FALLS:  Has patient fallen in last 6 months? No  LIVING ENVIRONMENT: Nothing pertinent  OCCUPATION:  Teacher   PLOF: Independent  PATIENT GOALS:  To have less pain in her jaw  NEXT MD VISIT: Nothing scheduled  OBJECTIVE:  Note: Objective measures were completed at Evaluation unless otherwise noted.  DIAGNOSTIC FINDINGS:  Nothing of the jaw  PATIENT SURVEYS:  TMJ outcome measure   COGNITION: Overall cognitive status: Within functional limits for tasks assessed  SENSATION: WNL  POSTURE: forward head  PALPATION: Significant tightness in upper traps and cervical spine    CERVICAL ROM:   Active ROM A/PROM (deg) eval  Flexion WNL  Extension WNL   Right lateral flexion   Left lateral flexion   Right rotation 60  Left rotation 60   (Blank rows = not tested)  UPPER EXTREMITY ROM: Full active range of motion of shoulders  Patient has opening and closing shift to the right greater than 1 cm shift noted  Jaw opening 5.3 cm with pain at end range  11/7  4.3 cm   Still has J deviation     TREATMENT DATE:  12/16 Trigger Point Dry Needling  Initial Treatment: Pt instructed on Dry Needling rational, procedures, and possible side effects. Pt instructed to expect mild to moderate muscle soreness later in the day and/or into the next day.  Pt instructed in methods to reduce muscle soreness. Pt instructed to continue prescribed HEP. Because Dry Needling was performed over or adjacent to a lung field, pt was educated on S/S of pneumothorax and to seek immediate medical attention should they occur.  Patient was educated on signs and symptoms of infection and other risk factors and advised to seek medical attention should they occur.  Patient verbalized understanding of these instructions and education.   Patient Verbal Consent Given: Yes Education Handout Provided: Yes Muscles Treated: masseter bilateral upper trap 3x left 2x right .30x50, right lateral Ptryegoid, right cervical parapsinla C3-C4  Electrical Stimulation Performed: No Treatment Response/Outcome: great twitch with all   Manual: Trigger point release to upper traps/suboccipitals/masseter/cervical paraspinals Skilled palpation of trigger points Review of self soft tissue mobilization Trigger point release to levator area  Sub-occipital release  Neuro muscular:  Bilateral ER 3x10 red  Horizontal abduction 3x10 red  Shoulder flexion 3x10 red  Review of posture and breathing  12/2 Trigger Point Dry Needling  Initial Treatment: Pt instructed on Dry Needling rational, procedures, and possible side effects. Pt instructed to expect mild to moderate muscle  soreness later in the day and/or into the next day.  Pt instructed in methods to reduce muscle soreness. Pt instructed to continue prescribed HEP. Because Dry Needling was performed over or adjacent to a lung field, pt was educated on S/S of pneumothorax and to seek immediate medical attention should they occur.  Patient was educated on signs and symptoms of infection and other risk factors and advised to seek medical attention should they occur.  Patient verbalized understanding of these instructions and education.   Patient Verbal Consent Given: Yes Education Handout Provided: Yes Muscles Treated: masseter bilateral upper trap 3x left 2x right .30x50, right lateral Ptryegoid, right cervical parapsinla C3-C4  Electrical Stimulation Performed: No Treatment Response/Outcome: great twitch with all   11/12 Trigger Point Dry Needling  Initial Treatment: Pt instructed on Dry Needling rational, procedures, and possible side effects. Pt instructed to expect mild to moderate muscle soreness later in the day and/or into the next day.  Pt instructed in methods to reduce muscle soreness. Pt instructed to continue prescribed HEP. Because Dry Needling was performed over or adjacent to a lung field, pt was educated on S/S of pneumothorax and to seek immediate medical attention should they occur.  Patient was educated on signs and symptoms of infection and other risk factors and advised to seek medical attention should they occur.  Patient verbalized understanding of these instructions and education.   Patient Verbal Consent Given: Yes Education Handout Provided: Yes Muscles Treated: masseter bilateral 2x each side upper trap 3x left 2x right .30x50, right lateral Ptryegoid  Electrical Stimulation Performed: No Treatment Response/Outcome: great twitch with all   Manual: Trigger point release to upper traps/suboccipitals/masseter/cervical paraspinals Skilled palpation of trigger points Review of  self soft tissue mobilization Trigger point release to levator area  Sub-occipital release  11/7 Trigger Point Dry Needling  Initial Treatment: Pt instructed on Dry  Needling rational, procedures, and possible side effects. Pt instructed to expect mild to moderate muscle soreness later in the day and/or into the next day.  Pt instructed in methods to reduce muscle soreness. Pt instructed to continue prescribed HEP. Because Dry Needling was performed over or adjacent to a lung field, pt was educated on S/S of pneumothorax and to seek immediate medical attention should they occur.  Patient was educated on signs and symptoms of infection and other risk factors and advised to seek medical attention should they occur.  Patient verbalized understanding of these instructions and education.   Patient Verbal Consent Given: Yes Education Handout Provided: Yes Muscles Treated: masseter bilateral 2x each side upper trap 3x left 2x right .30x50, right lateral Ptryegoid  Electrical Stimulation Performed: No Treatment Response/Outcome: great twitch with all   Manual: Trigger point release to upper traps/suboccipitals/masseter/cervical paraspinals Skilled palpation of trigger points Review of self soft tissue mobilization Trigger point release to levator area  Sub-occipital release    Neuro muscular reeducation: Shoulder extension 3x12 green  Scap retraction 3x12 green   Reviewed roccabados    10/22 Trigger Point Dry Needling  Initial Treatment: Pt instructed on Dry Needling rational, procedures, and possible side effects. Pt instructed to expect mild to moderate muscle soreness later in the day and/or into the next day.  Pt instructed in methods to reduce muscle soreness. Pt instructed to continue prescribed HEP. Because Dry Needling was performed over or adjacent to a lung field, pt was educated on S/S of pneumothorax and to seek immediate medical attention should they occur.  Patient  was educated on signs and symptoms of infection and other risk factors and advised to seek medical attention should they occur.  Patient verbalized understanding of these instructions and education.   Patient Verbal Consent Given: Yes Education Handout Provided: Yes Muscles Treated: masseter bilateral 2x each side upper trap 3x left 2x right .30x50, right lateral Ptryegoid  Electrical Stimulation Performed: No Treatment Response/Outcome: great twitch with all   Manual: Trigger point release to upper traps/suboccipitals/masseter/cervical paraspinals Skilled palpation of trigger points Review of self soft tissue mobilization Trigger point release to levator area  Sub-occipital release    10/15 Trigger Point Dry Needling  Initial Treatment: Pt instructed on Dry Needling rational, procedures, and possible side effects. Pt instructed to expect mild to moderate muscle soreness later in the day and/or into the next day.  Pt instructed in methods to reduce muscle soreness. Pt instructed to continue prescribed HEP. Because Dry Needling was performed over or adjacent to a lung field, pt was educated on S/S of pneumothorax and to seek immediate medical attention should they occur.  Patient was educated on signs and symptoms of infection and other risk factors and advised to seek medical attention should they occur.  Patient verbalized understanding of these instructions and education.   Patient Verbal Consent Given: Yes Education Handout Provided: Yes Muscles Treated: masseter bilateral 2x each side upper trap 3x left 2x right .30x50 Electrical Stimulation Performed: No Treatment Response/Outcome: great twitch with all   Manual: Trigger point release to upper traps/suboccipitals/masseter/cervical paraspinals Skilled palpation of trigger points Review of self soft tissue mobilization Trigger point release to levator area  Sub-occipital release  PATIENT EDUCATION:  Education details: HEP, symptom management  Person educated: Patient Education method: Explanation, Demonstration, Tactile cues, Verbal cues, and Handouts Education comprehension: verbalized understanding, returned demonstration, verbal cues required, tactile cues required, and needs further education  HOME EXERCISE PROGRAM: Access Code: [email or web address] URL: [email or web address] Date: 08/26/2024 Prepared by: Alm Don  Exercises - Tongue Clicks TMJ  - 3 x daily - 7 x weekly - 1 sets - 6 reps - Isometric Jaw Deviation  - 3 x daily - 7 x weekly - 1 sets - 10 reps - Isometric Jaw Abduction  - 3 x daily - 7 x weekly - 1 sets - 10 reps - Seated Cervical Retraction  - 3 x daily - 7 x weekly - 1 sets - 10 reps - Scapular Retraction with Resistance  - 3 x daily - 7 x weekly - 2 sets - 10 reps - Jaw Opening  - 3 x daily - 7 x weekly - 3 sets - 10 reps  ASSESSMENT:  CLINICAL IMPRESSION: Therapy reviewed a series she can do easily while traveling. We reviewed how to advance using the bands. She continues to have benefit from needling. She continues to have a great twitch response to all needles Therapy will continue to progress as tolerated. She was encouraged to do as much as she can while she is away as far as her roccabados and shoulder exercises.  Eval: Patient is a 53 year old female with long history of bilateral jaw pain.  She has tenderness palpation in her masseter area bilateral upper traps, and cervical paraspinals on both sides.  She has a significant right shift with opening and closing.  She does have full opening but pain at end range end ranges.  She has increased muscle tension in her upper traps and cervical paraspinals.  She would benefit from skilled therapy to improve her ability to eat and perform daily activities without pain OBJECTIVE IMPAIRMENTS: Difficulty  chewing difficulty yawning goal, pain muscle tightness.   ACTIVITY LIMITATIONS: Eating, yawning  PARTICIPATION LIMITATIONS:  PERSONAL FACTORS: 1 comorbidity: Anxiety, migraines are also affecting patient's functional outcome.   REHAB POTENTIAL: Good  CLINICAL DECISION MAKING: Stable/uncomplicated  EVALUATION COMPLEXITY: Low   GOALS: Goals reviewed with patient? Yes  SHORT TERM GOALS: Target date: 09/23/2024    Patient will demonstrate full jaqopening without bilateral jaw pain Baseline:  Goal status: This exacerbation 11/7  2.  Patient will show no deviation with opening and closing Baseline:  Goal status: Deviation had gone away but is back 11/7  3.  Patient will be independent with self soft tissue mobilization Baseline:  Goal status: Achieved 11/7  4.  Patient will perform Roccoddod with minimal cueing Baseline:  Goal status: Achieved achieved 11/7   LONG TERM GOALS: Target date: 10/21/2024    Patient will be independent with complete posterior chain strengthening HEP to prevent further exacerbation Baseline:  Goal status: Progressing 11/7  2.  Patient will chew food without pain Baseline:  Goal status: Was improving but now back to having pain 11/7  3.  Patient will have no pain carrying conversations Baseline:  Goal status: Had improved but now back to painful 11/7    PLAN:  PT FREQUENCY: 1x  PT DURATION: 8 weeks  PLANNED INTERVENTIONS: 97110-Therapeutic exercises, 97530- Therapeutic activity, 97112- Neuromuscular re-education, 97535- Self Care, [id number]- Manual therapy, [id number]- Gait training, c Therapy, [id number]- Electrical stimulation (unattended), 97035- Ultrasound, Patient/Family education, Stair training, Taping, Dry Needling, DME instructions, Cryotherapy, and Moist heat   PLAN FOR  NEXT SESSION: Trigger point dry needling to masseter and lateral pterygoid, trigger point dry needling to upper traps and cervical paraspinals.  Soft tissue mobilization  upper traps and cervical paraspinals as well as masseter.  Review rows and shoulder extensions.  Reviewed cervical retraction.  Review current HEP and troubleshoot.   Alm JINNY Don, PT 12/13/2024, 1:12 PM

## 2024-12-13 ENCOUNTER — Encounter (HOSPITAL_BASED_OUTPATIENT_CLINIC_OR_DEPARTMENT_OTHER): Payer: Self-pay | Admitting: Physical Therapy

## 2024-12-30 ENCOUNTER — Ambulatory Visit (HOSPITAL_BASED_OUTPATIENT_CLINIC_OR_DEPARTMENT_OTHER): Attending: Physician Assistant | Admitting: Physical Therapy

## 2024-12-30 DIAGNOSIS — M62838 Other muscle spasm: Secondary | ICD-10-CM | POA: Diagnosis present

## 2024-12-30 DIAGNOSIS — R278 Other lack of coordination: Secondary | ICD-10-CM | POA: Insufficient documentation

## 2024-12-30 DIAGNOSIS — R293 Abnormal posture: Secondary | ICD-10-CM | POA: Insufficient documentation

## 2024-12-30 DIAGNOSIS — M6281 Muscle weakness (generalized): Secondary | ICD-10-CM | POA: Diagnosis present

## 2024-12-30 NOTE — Therapy (Signed)
 " OUTPATIENT PHYSICAL THERAPY CERVICAL EVALUATION   Patient Name: Shelly Wilkinson MRN: [id number] DOB:[birth year], 54 y.o., female Today's Date: 12/30/2024  END OF SESSION:        Past Medical History:  Diagnosis Date   Allergy    Anemia, iron deficiency 08/25/2016   Anxiety    Arthritis    Choroidal nevus    Chronic anal fissure    Complication of anesthesia    headaches/   06-22-2014 surg. had urinary retention   Constipation due to outlet dysfunction    Dyssynergic constipation 10/01/2016   Anorectal mano   Episcleritis-uveitis? 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   Left sided ulcerative colitis (? Crohn's) with complication (HCC) 11/18/2016   Diagnosed fall 2017. Indeterminate probably. Rectal involvement but patchy small ulcers. Initially chewed with Lialda  and uceris  was added. Changed to Humira  May 2018. Prednisone  started April 2018. Improved.     07/2022 Yusimry  - biosimilar started   Macular degeneration    Migraines    Thrombosed hemorrhoids    Vitamin D  deficiency 11/19/2016   Wears glasses    Past Surgical History:  Procedure Laterality Date   ANAL RECTAL MANOMETRY N/A 09/17/2016   Procedure: ANO RECTAL MANOMETRY;  Surgeon: Lupita FORBES Commander, MD;  Location: [facility];  Service: Endoscopy;  Laterality: N/A;   CHEILECTOMY Right 12/15/2013   Procedure: RIGHT HALLUX CHEILECTOMY;  Surgeon: Norleen Armor, MD;  Location: [facility];  Service: Orthopedics;  Laterality: Right;   CHEILECTOMY Left 02-12-2006   great toe   COLONOSCOPY  10-02-2010   CYSTO/  HYDRODISTENTION/  INSTILLATION THERAPY  03-19-2010   D & C HYSTEROSCOPY /  HYDROTHERMAL ENDOMETRIAL ABLATION/  LEEP  05-29-2011   DILATION AND EVACUATION  2002   ESOPHAGOGASTRODUODENOSCOPY  (EGD) WITH PROPOFOL  N/A 04/17/2022   Procedure: ESOPHAGOGASTRODUODENOSCOPY (EGD) WITH PROPOFOL ;  Surgeon: Teressa Toribio SQUIBB, MD;  Location: [facility];  Service: Gastroenterology;  Laterality: N/A;   EUS N/A 04/17/2022   Procedure: UPPER ENDOSCOPIC ULTRASOUND (EUS) RADIAL;  Surgeon: Teressa Toribio SQUIBB, MD;  Location: [facility];  Service: Gastroenterology;  Laterality: N/A;   EVALUATION UNDER ANESTHESIA WITH ANAL FISTULECTOMY N/A 12/26/2014   Procedure: EXAM UNDER ANESTHESIA ;  Surgeon: Bernarda Ned, MD;  Location: [facility];  Service: General;  Laterality: N/A;   HEMORRHOID BANDING  02-16-2014   REPAIR LACERATED RADIAL DIGITAL NERVE LEFT RING FINGER  08-25-2005   SPHINCTEROTOMY N/A 12/26/2014   Procedure: CHEMICAL SPHINCTEROTOMY (BOTOX );  Surgeon: Bernarda Ned, MD;  Location: [facility];  Service: General;  Laterality: N/A;   TRANSANAL HEMORRHOIDAL DEARTERIALIZATION  06-22-2014   Patient Active Problem List   Diagnosis Date Noted   Abnormal transaminases 07/03/2024   Osteopenia after menopause 12/17/2023   Chronic migraine without aura without status migrainosus, not intractable 09/12/2022   Abnormal CT scan, stomach    Hyperinsulinemic hypoglycemia 02/18/2022   Hypoglycemia 12/10/2021   Muscle spasm 11/30/2020   Abnormal mammogram of right breast 05/03/2018   Long-term use of immunosuppressant medication - Humira  06/16/2017   Vitamin D  deficiency 11/19/2016   Left sided ulcerative colitis (? Crohn's) with complication (HCC) 11/18/2016   Anemia, iron deficiency 08/25/2016   Prolapsed internal hemorrhoids, grade 3 02/27/2016   Interstitial cystitis 10/19/2012   Migraine headache 10/19/2012  PCP: Trula Brim MD  REFERRING PROVIDER: Latrelle Holt PA  REFERRING DIAG:  TMJ  THERAPY DIAG:  No diagnosis found.  Rationale for Evaluation and Treatment: Rehabilitation  ONSET DATE:    SUBJECTIVE:                                                                                                                                                                                                          SUBJECTIVE STATEMENT: The patient reports she has been guarding her jaw some but overall her pain has been controlled. She reports she had less stress on her trip and she feels like that may have contributed to a decrease in her jaw pain.     Eval: The patient has long history of jaw pain.  She has increased pain when chewing and opening.  She also clenches her teeth at night.  She has a bite guard.  She also has a long history of migraines.  She gets Botox  in her forehead.  Hand dominance: Right  PERTINENT HISTORY:  Anxiety: Arthritis in the feet: Migraines: Macular degeneration:  PAIN:  Are you having pain? Yes: NPRS scale: 5/10 7/20 at worst Pain location: Bilateral jaw Pain description: aching  Aggravating factors: chewing  Relieving factors: ice, massage   PRECAUTIONS: None  RED FLAGS: None     WEIGHT BEARING RESTRICTIONS: No  FALLS:  Has patient fallen in last 6 months? No  LIVING ENVIRONMENT: Nothing pertinent  OCCUPATION:  Teacher   PLOF: Independent  PATIENT GOALS:  To have less pain in her jaw  NEXT MD VISIT: Nothing scheduled  OBJECTIVE:  Note: Objective measures were completed at Evaluation unless otherwise noted.  DIAGNOSTIC FINDINGS:  Nothing of the jaw  PATIENT SURVEYS:  TMJ outcome measure   COGNITION: Overall cognitive status: Within functional limits for tasks assessed  SENSATION: WNL  POSTURE: forward head  PALPATION: Significant tightness in upper traps and cervical spine    CERVICAL ROM:   Active ROM A/PROM (deg) eval  Flexion WNL  Extension WNL  Right lateral flexion   Left lateral flexion   Right rotation 60  Left rotation 60   (Blank rows = not tested)  UPPER EXTREMITY ROM: Full active range of motion of shoulders   Patient has opening and closing shift to the  right greater than 1 cm shift noted  Jaw opening 5.3 cm with pain at end range  11/7  4.3 cm   Still has J deviation     TREATMENT DATE:  1/2 rigger Point Dry Needling  Initial Treatment:  Pt instructed on Dry Needling rational, procedures, and possible side effects. Pt instructed to expect mild to moderate muscle soreness later in the day and/or into the next day.  Pt instructed in methods to reduce muscle soreness. Pt instructed to continue prescribed HEP. Because Dry Needling was performed over or adjacent to a lung field, pt was educated on S/S of pneumothorax and to seek immediate medical attention should they occur.  Patient was educated on signs and symptoms of infection and other risk factors and advised to seek medical attention should they occur.  Patient verbalized understanding of these instructions and education.   Patient Verbal Consent Given: Yes Education Handout Provided: Yes Muscles Treated: masseter bilateral upper trap 3x left 2x right .30x50, right lateral Ptryegoid, right cervical parapsinla C3-C4  Electrical Stimulation Performed: No Treatment Response/Outcome: great twitch with all   Manual: Trigger point release to upper traps/suboccipitals/masseter/cervical paraspinals Skilled palpation of trigger points Review of self soft tissue mobilization Trigger point release to levator area  Sub-occipital release   Discussed letting her shoulders down in stressful situations    12/16 Trigger Point Dry Needling  Initial Treatment: Pt instructed on Dry Needling rational, procedures, and possible side effects. Pt instructed to expect mild to moderate muscle soreness later in the day and/or into the next day.  Pt instructed in methods to reduce muscle soreness. Pt instructed to continue prescribed HEP. Because Dry Needling was performed over or adjacent to a lung field, pt was educated on S/S of pneumothorax and to seek immediate medical attention should  they occur.  Patient was educated on signs and symptoms of infection and other risk factors and advised to seek medical attention should they occur.  Patient verbalized understanding of these instructions and education.   Patient Verbal Consent Given: Yes Education Handout Provided: Yes Muscles Treated: masseter bilateral upper trap 3x left 2x right .30x50, right lateral Ptryegoid, right cervical parapsinla C3-C4  Electrical Stimulation Performed: No Treatment Response/Outcome: great twitch with all   Manual: Trigger point release to upper traps/suboccipitals/masseter/cervical paraspinals Skilled palpation of trigger points Review of self soft tissue mobilization Trigger point release to levator area  Sub-occipital release  Neuro muscular:  Bilateral ER 3x10 red  Horizontal abduction 3x10 red  Shoulder flexion 3x10 red  Review of posture and breathing  12/2 Trigger Point Dry Needling  Initial Treatment: Pt instructed on Dry Needling rational, procedures, and possible side effects. Pt instructed to expect mild to moderate muscle soreness later in the day and/or into the next day.  Pt instructed in methods to reduce muscle soreness. Pt instructed to continue prescribed HEP. Because Dry Needling was performed over or adjacent to a lung field, pt was educated on S/S of pneumothorax and to seek immediate medical attention should they occur.  Patient was educated on signs and symptoms of infection and other risk factors and advised to seek medical attention should they occur.  Patient verbalized understanding of these instructions and education.   Patient Verbal Consent Given: Yes Education Handout Provided: Yes Muscles Treated: masseter bilateral upper trap 3x left 2x right .30x50, right lateral Ptryegoid, right cervical parapsinla C3-C4  Electrical Stimulation Performed: No Treatment Response/Outcome: great twitch with all   11/12 Trigger Point Dry Needling  Initial Treatment:  Pt instructed on Dry Needling rational, procedures, and possible side effects. Pt instructed to expect mild to moderate muscle soreness later in the day and/or into the next day.  Pt instructed in methods to reduce muscle soreness. Pt instructed to continue  prescribed HEP. Because Dry Needling was performed over or adjacent to a lung field, pt was educated on S/S of pneumothorax and to seek immediate medical attention should they occur.  Patient was educated on signs and symptoms of infection and other risk factors and advised to seek medical attention should they occur.  Patient verbalized understanding of these instructions and education.   Patient Verbal Consent Given: Yes Education Handout Provided: Yes Muscles Treated: masseter bilateral 2x each side upper trap 3x left 2x right .30x50, right lateral Ptryegoid  Electrical Stimulation Performed: No Treatment Response/Outcome: great twitch with all   Manual: Trigger point release to upper traps/suboccipitals/masseter/cervical paraspinals Skilled palpation of trigger points Review of self soft tissue mobilization Trigger point release to levator area  Sub-occipital release  11/7                                                                                                                              PATIENT EDUCATION:  Education details: HEP, symptom management  Person educated: Patient Education method: Explanation, Demonstration, Tactile cues, Verbal cues, and Handouts Education comprehension: verbalized understanding, returned demonstration, verbal cues required, tactile cues required, and needs further education  HOME EXERCISE PROGRAM: Access Code: [email or web address] URL: [email or web address] Date: 08/26/2024 Prepared by: Alm Don  Exercises - Tongue Clicks TMJ  - 3 x daily - 7 x weekly - 1 sets - 6 reps - Isometric Jaw Deviation  - 3 x daily - 7 x weekly - 1 sets - 10 reps - Isometric Jaw Abduction  - 3 x  daily - 7 x weekly - 1 sets - 10 reps - Seated Cervical Retraction  - 3 x daily - 7 x weekly - 1 sets - 10 reps - Scapular Retraction with Resistance  - 3 x daily - 7 x weekly - 2 sets - 10 reps - Jaw Opening  - 3 x daily - 7 x weekly - 3 sets - 10 reps  ASSESSMENT:  CLINICAL IMPRESSION: Therapy focused on needling and manual therapy 2nd to time today. She had a great twitch in her upper trap and lateral Pterygoid. She tolerated well. She had no pain following treatment. She will perform her exercises later. She also performed some of her exercises yesterday.      Eval: Patient is a 54 year old female with long history of bilateral jaw pain.  She has tenderness palpation in her masseter area bilateral upper traps, and cervical paraspinals on both sides.  She has a significant right shift with opening and closing.  She does have full opening but pain at end range end ranges.  She has increased muscle tension in her upper traps and cervical paraspinals.  She would benefit from skilled therapy to improve her ability to eat and perform daily activities without pain OBJECTIVE IMPAIRMENTS: Difficulty chewing difficulty yawning goal, pain muscle tightness.   ACTIVITY LIMITATIONS: Eating, yawning  PARTICIPATION LIMITATIONS:  PERSONAL FACTORS: 1 comorbidity: Anxiety, migraines are also affecting  patient's functional outcome.   REHAB POTENTIAL: Good  CLINICAL DECISION MAKING: Stable/uncomplicated  EVALUATION COMPLEXITY: Low   GOALS: Goals reviewed with patient? Yes  SHORT TERM GOALS: Target date: 09/23/2024    Patient will demonstrate full jaqopening without bilateral jaw pain Baseline:  Goal status: This exacerbation 11/7  2.  Patient will show no deviation with opening and closing Baseline:  Goal status: Deviation had gone away but is back 11/7  3.  Patient will be independent with self soft tissue mobilization Baseline:  Goal status: Achieved 11/7  4.  Patient will perform  Roccoddod with minimal cueing Baseline:  Goal status: Achieved achieved 11/7   LONG TERM GOALS: Target date: 10/21/2024    Patient will be independent with complete posterior chain strengthening HEP to prevent further exacerbation Baseline:  Goal status: Progressing 11/7  2.  Patient will chew food without pain Baseline:  Goal status: Was improving but now back to having pain 11/7  3.  Patient will have no pain carrying conversations Baseline:  Goal status: Had improved but now back to painful 11/7    PLAN:  PT FREQUENCY: 1x  PT DURATION: 8 weeks  PLANNED INTERVENTIONS: 97110-Therapeutic exercises, 97530- Therapeutic activity, 97112- Neuromuscular re-education, 97535- Self Care, [id number]- Manual therapy, [id number]- Gait training, c Therapy, 97014- Electrical stimulation (unattended), 97035- Ultrasound, Patient/Family education, Stair training, Taping, Dry Needling, DME instructions, Cryotherapy, and Moist heat   PLAN FOR NEXT SESSION: Trigger point dry needling to masseter and lateral pterygoid, trigger point dry needling to upper traps and cervical paraspinals.  Soft tissue mobilization upper traps and cervical paraspinals as well as masseter.  Review rows and shoulder extensions.  Reviewed cervical retraction.  Review current HEP and troubleshoot.   Alm JINNY Don, PT 12/30/2024, 9:50 AM      "

## 2025-01-06 ENCOUNTER — Ambulatory Visit (HOSPITAL_BASED_OUTPATIENT_CLINIC_OR_DEPARTMENT_OTHER): Admitting: Physical Therapy

## 2025-01-09 ENCOUNTER — Other Ambulatory Visit: Payer: Self-pay | Admitting: Obstetrics & Gynecology

## 2025-01-09 DIAGNOSIS — Z1231 Encounter for screening mammogram for malignant neoplasm of breast: Secondary | ICD-10-CM

## 2025-01-13 ENCOUNTER — Encounter (HOSPITAL_BASED_OUTPATIENT_CLINIC_OR_DEPARTMENT_OTHER): Payer: Self-pay | Admitting: Physical Therapy

## 2025-01-13 ENCOUNTER — Ambulatory Visit (HOSPITAL_BASED_OUTPATIENT_CLINIC_OR_DEPARTMENT_OTHER): Admitting: Physical Therapy

## 2025-01-13 DIAGNOSIS — R293 Abnormal posture: Secondary | ICD-10-CM

## 2025-01-13 DIAGNOSIS — M6281 Muscle weakness (generalized): Secondary | ICD-10-CM

## 2025-01-13 NOTE — Therapy (Signed)
 " OUTPATIENT PHYSICAL THERAPY CERVICAL EVALUATION   Patient Name: Shelly Wilkinson MRN: [id number] DOB:[birth year], 54 y.o., female Today's Date: 01/13/2025  END OF SESSION:  PT End of Session - 01/13/25 1148     Visit Number 11    Number of Visits 16    Date for Recertification  12/30/24    PT Start Time 1148    PT Stop Time 1230    PT Time Calculation (min) 42 min    Activity Tolerance Patient tolerated treatment well    Behavior During Therapy [facility] for tasks assessed/performed               Past Medical History:  Diagnosis Date   Allergy    Anemia, iron deficiency 08/25/2016   Anxiety    Arthritis    Choroidal nevus    Chronic anal fissure    Complication of anesthesia    headaches/   06-22-2014 surg. had urinary retention   Constipation due to outlet dysfunction    Dyssynergic constipation 10/01/2016   Anorectal mano   Episcleritis-uveitis? 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   Left sided ulcerative colitis (? Crohn's) with complication (HCC) 11/18/2016   Diagnosed fall 2017. Indeterminate probably. Rectal involvement but patchy small ulcers. Initially chewed with Lialda  and uceris  was added. Changed to Humira  May 2018. Prednisone  started April 2018. Improved.     07/2022 Yusimry  - biosimilar started   Macular degeneration    Migraines    Thrombosed hemorrhoids    Vitamin D  deficiency 11/19/2016   Wears glasses    Past Surgical History:  Procedure Laterality Date   ANAL RECTAL MANOMETRY N/A 09/17/2016   Procedure: ANO RECTAL MANOMETRY;  Surgeon: Lupita FORBES Commander, MD;  Location: [facility];  Service: Endoscopy;  Laterality: N/A;   CHEILECTOMY Right 12/15/2013   Procedure: RIGHT HALLUX CHEILECTOMY;  Surgeon: Norleen Armor, MD;  Location: [facility];  Service: Orthopedics;  Laterality: Right;   CHEILECTOMY Left 02-12-2006   great toe   COLONOSCOPY  10-02-2010   CYSTO/  HYDRODISTENTION/  INSTILLATION THERAPY  03-19-2010   D & C HYSTEROSCOPY /  HYDROTHERMAL ENDOMETRIAL ABLATION/  LEEP  05-29-2011   DILATION AND EVACUATION  2002   ESOPHAGOGASTRODUODENOSCOPY (EGD) WITH PROPOFOL  N/A 04/17/2022   Procedure: ESOPHAGOGASTRODUODENOSCOPY (EGD) WITH PROPOFOL ;  Surgeon: Teressa Toribio SQUIBB, MD;  Location: [facility];  Service: Gastroenterology;  Laterality: N/A;   EUS N/A 04/17/2022   Procedure: UPPER ENDOSCOPIC ULTRASOUND (EUS) RADIAL;  Surgeon: Teressa Toribio SQUIBB, MD;  Location: [facility];  Service: Gastroenterology;  Laterality: N/A;   EVALUATION UNDER ANESTHESIA WITH ANAL FISTULECTOMY N/A 12/26/2014   Procedure: EXAM UNDER ANESTHESIA ;  Surgeon: Bernarda Ned, MD;  Location[facility];  Service: General;  Laterality: N/A;   HEMORRHOID BANDING  02-16-2014   REPAIR LACERATED RADIAL DIGITAL NERVE LEFT RING FINGER  08-25-2005   SPHINCTEROTOMY N/A 12/26/2014   Procedure: CHEMICAL SPHINCTEROTOMY (BOTOX );  Surgeon: Bernarda Ned, MD;  Location: [facility];  Service: General;  Laterality: N/A;   TRANSANAL HEMORRHOIDAL DEARTERIALIZATION  06-22-2014   Patient Active Problem List   Diagnosis Date Noted   Abnormal transaminases 07/03/2024   Osteopenia after menopause 12/17/2023   Chronic migraine without aura without status migrainosus, not intractable 09/12/2022   Abnormal  CT scan, stomach    Hyperinsulinemic hypoglycemia 02/18/2022   Hypoglycemia 12/10/2021   Muscle spasm 11/30/2020   Abnormal mammogram of right breast 05/03/2018   Long-term use of immunosuppressant medication - Humira  06/16/2017   Vitamin D  deficiency 11/19/2016   Left sided ulcerative colitis (? Crohn's) with complication (HCC) 11/18/2016   Anemia, iron deficiency 08/25/2016   Prolapsed internal hemorrhoids, grade 3 02/27/2016    Interstitial cystitis 10/19/2012   Migraine headache 10/19/2012    PCP: Trula Brim MD  REFERRING PROVIDER: Latrelle Holt PA  REFERRING DIAG:  TMJ  THERAPY DIAG:  Abnormal posture  Muscle weakness (generalized)  Rationale for Evaluation and Treatment: Rehabilitation  ONSET DATE:    SUBJECTIVE:                                                                                                                                                                                                         SUBJECTIVE STATEMENT: The patient reports she has been guarding her jaw some but overall her pain has been controlled. She reports that when she didn't use her mouth guard one night - she woke up without the usual pain from clenching her teeth. Pt asked if she should go see her dentist about it. PT recommended to try leaving the guard out for a little bit and see how jaw feels and fu after that.    Eval: The patient has long history of jaw pain.  She has increased pain when chewing and opening.  She also clenches her teeth at night.  She has a bite guard.  She also has a long history of migraines.  She gets Botox  in her forehead.  Hand dominance: Right  PERTINENT HISTORY:  Anxiety: Arthritis in the feet: Migraines: Macular degeneration:  PAIN:  Are you having pain? Yes: NPRS scale: 5/10 7/20 at worst Pain location: Bilateral jaw Pain description: aching  Aggravating factors: chewing  Relieving factors: ice, massage   PRECAUTIONS: None  RED FLAGS: None     WEIGHT BEARING RESTRICTIONS: No  FALLS:  Has patient fallen in last 6 months? No  LIVING ENVIRONMENT: Nothing pertinent  OCCUPATION:  Teacher   PLOF: Independent  PATIENT GOALS:  To have less pain in her jaw  NEXT MD VISIT: Nothing scheduled  OBJECTIVE:  Note: Objective measures were completed at Evaluation unless otherwise noted.  DIAGNOSTIC FINDINGS:  Nothing of the jaw  PATIENT SURVEYS:  TMJ outcome  measure   COGNITION: Overall cognitive status: Within functional limits for tasks assessed  SENSATION: WNL  POSTURE: forward head  PALPATION:  Significant tightness in upper traps and cervical spine    CERVICAL ROM:   Active ROM A/PROM (deg) eval  Flexion WNL  Extension WNL  Right lateral flexion   Left lateral flexion   Right rotation 60  Left rotation 60   (Blank rows = not tested)  UPPER EXTREMITY ROM: Full active range of motion of shoulders   Patient has opening and closing shift to the right greater than 1 cm shift noted  Jaw opening 5.3 cm with pain at end range  11/7  4.3 cm   Still has J deviation     TREATMENT DATE:  1/16 Dry needling to the masseter, pterogoids, upper trap, and semispinalis capitis.  Reviewed HEP with pt   1/2 rigger Point Dry Needling  Initial Treatment: Pt instructed on Dry Needling rational, procedures, and possible side effects. Pt instructed to expect mild to moderate muscle soreness later in the day and/or into the next day.  Pt instructed in methods to reduce muscle soreness. Pt instructed to continue prescribed HEP. Because Dry Needling was performed over or adjacent to a lung field, pt was educated on S/S of pneumothorax and to seek immediate medical attention should they occur.  Patient was educated on signs and symptoms of infection and other risk factors and advised to seek medical attention should they occur.  Patient verbalized understanding of these instructions and education.   Patient Verbal Consent Given: Yes Education Handout Provided: Yes Muscles Treated: masseter bilateral upper trap 3x left 2x right .30x50, right lateral Ptryegoid, right cervical parapsinla C3-C4  Electrical Stimulation Performed: No Treatment Response/Outcome: great twitch with all   Manual: Trigger point release to upper traps/suboccipitals/masseter/cervical paraspinals Skilled palpation of trigger points Review of self soft tissue  mobilization Trigger point release to levator area  Sub-occipital release   Discussed letting her shoulders down in stressful situations  Discussed her mouth guard but will defer final decision to dentist    12/16 Trigger Point Dry Needling  Initial Treatment: Pt instructed on Dry Needling rational, procedures, and possible side effects. Pt instructed to expect mild to moderate muscle soreness later in the day and/or into the next day.  Pt instructed in methods to reduce muscle soreness. Pt instructed to continue prescribed HEP. Because Dry Needling was performed over or adjacent to a lung field, pt was educated on S/S of pneumothorax and to seek immediate medical attention should they occur.  Patient was educated on signs and symptoms of infection and other risk factors and advised to seek medical attention should they occur.  Patient verbalized understanding of these instructions and education.   Patient Verbal Consent Given: Yes Education Handout Provided: Yes Muscles Treated: masseter bilateral upper trap 3x left 2x right .30x50, right lateral Ptryegoid, right cervical parapsinla C3-C4  Electrical Stimulation Performed: No Treatment Response/Outcome: great twitch with all   Manual: Trigger point release to upper traps/suboccipitals/masseter/cervical paraspinals Skilled palpation of trigger points Review of self soft tissue mobilization Trigger point release to levator area  Sub-occipital release  Neuro muscular:  Bilateral ER 3x10 red  Horizontal abduction 3x10 red  Shoulder flexion 3x10 red  Review of posture and breathing  12/2 Trigger Point Dry Needling  Initial Treatment: Pt instructed on Dry Needling rational, procedures, and possible side effects. Pt instructed to expect mild to moderate muscle soreness later in the day and/or into the next day.  Pt instructed in methods to reduce muscle soreness. Pt instructed to continue prescribed HEP. Because Dry Needling  was performed over  or adjacent to a lung field, pt was educated on S/S of pneumothorax and to seek immediate medical attention should they occur.  Patient was educated on signs and symptoms of infection and other risk factors and advised to seek medical attention should they occur.  Patient verbalized understanding of these instructions and education.   Patient Verbal Consent Given: Yes Education Handout Provided: Yes Muscles Treated: masseter bilateral upper trap 3x left 2x right .30x50, right lateral Ptryegoid, right cervical parapsinla C3-C4  Electrical Stimulation Performed: No Treatment Response/Outcome: great twitch with all   11/12 Trigger Point Dry Needling  Initial Treatment: Pt instructed on Dry Needling rational, procedures, and possible side effects. Pt instructed to expect mild to moderate muscle soreness later in the day and/or into the next day.  Pt instructed in methods to reduce muscle soreness. Pt instructed to continue prescribed HEP. Because Dry Needling was performed over or adjacent to a lung field, pt was educated on S/S of pneumothorax and to seek immediate medical attention should they occur.  Patient was educated on signs and symptoms of infection and other risk factors and advised to seek medical attention should they occur.  Patient verbalized understanding of these instructions and education.   Patient Verbal Consent Given: Yes Education Handout Provided: Yes Muscles Treated: masseter bilateral 2x each side upper trap 3x left 2x right .30x50, right lateral Ptryegoid  Electrical Stimulation Performed: No Treatment Response/Outcome: great twitch with all   Manual: Trigger point release to upper traps/suboccipitals/masseter/cervical paraspinals Skilled palpation of trigger points Review of self soft tissue mobilization Trigger point release to levator area  Sub-occipital release  11/7                                                                                                                               PATIENT EDUCATION:  Education details: HEP, symptom management  Person educated: Patient Education method: Explanation, Demonstration, Tactile cues, Verbal cues, and Handouts Education comprehension: verbalized understanding, returned demonstration, verbal cues required, tactile cues required, and needs further education  HOME EXERCISE PROGRAM: Access Code: [email or web address] URL: [email or web address] Date: 08/26/2024 Prepared by: Alm Don  Exercises - Tongue Clicks TMJ  - 3 x daily - 7 x weekly - 1 sets - 6 reps - Isometric Jaw Deviation  - 3 x daily - 7 x weekly - 1 sets - 10 reps - Isometric Jaw Abduction  - 3 x daily - 7 x weekly - 1 sets - 10 reps - Seated Cervical Retraction  - 3 x daily - 7 x weekly - 1 sets - 10 reps - Scapular Retraction with Resistance  - 3 x daily - 7 x weekly - 2 sets - 10 reps - Jaw Opening  - 3 x daily - 7 x weekly - 3 sets - 10 reps  ASSESSMENT:  CLINICAL IMPRESSION: Therapy focused on needling and manual therapy. Pt had a great twitch in her pterogoids and felt relief after DN of  the anterior jaw. Pt also had a great twitch and release in the upper trap.  It was like she clenches more on her bite guard at night. She was advised to try to go without it . The night she did not use it she had no pain at all. She continues to have tightness in her upper traps but it has improved. She is progressing. The patient will likley continue for a few more visits to finalize HEP and continue to work on needling and HEP.     Eval: Patient is a 54 year old female with long history of bilateral jaw pain.  She has tenderness palpation in her masseter area bilateral upper traps, and cervical paraspinals on both sides.  She has a significant right shift with opening and closing.  She does have full opening but pain at end range end ranges.  She has increased muscle tension in her upper traps and cervical  paraspinals.  She would benefit from skilled therapy to improve her ability to eat and perform daily activities without pain OBJECTIVE IMPAIRMENTS: Difficulty chewing difficulty yawning goal, pain muscle tightness.   ACTIVITY LIMITATIONS: Eating, yawning  PARTICIPATION LIMITATIONS:  PERSONAL FACTORS: 1 comorbidity: Anxiety, migraines are also affecting patient's functional outcome.   REHAB POTENTIAL: Good  CLINICAL DECISION MAKING: Stable/uncomplicated  EVALUATION COMPLEXITY: Low   GOALS: Goals reviewed with patient? Yes  SHORT TERM GOALS: Target date: 09/23/2024    Patient will demonstrate full jaqopening without bilateral jaw pain Baseline:  Goal status: This exacerbation 11/7  2.  Patient will show no deviation with opening and closing Baseline:  Goal status: Deviation had gone away but is back 11/7  3.  Patient will be independent with self soft tissue mobilization Baseline:  Goal status: Achieved 11/7  4.  Patient will perform Roccoddod with minimal cueing Baseline:  Goal status: Achieved achieved 11/7   LONG TERM GOALS: Target date: 10/21/2024    Patient will be independent with complete posterior chain strengthening HEP to prevent further exacerbation Baseline:  Goal status: Progressing 11/7  2.  Patient will chew food without pain Baseline:  Goal status: Was improving but now back to having pain 11/7  3.  Patient will have no pain carrying conversations Baseline:  Goal status: Had improved but now back to painful 11/7    PLAN:  PT FREQUENCY: 1x  PT DURATION: 8 weeks  PLANNED INTERVENTIONS: 97110-Therapeutic exercises, 97530- Therapeutic activity, 97112- Neuromuscular re-education, 97535- Self Care, 02859- Manual therapy, 97116- Gait training, c Therapy, 97014- Electrical stimulation (unattended), 97035- Ultrasound, Patient/Family education, Stair training, Taping, Dry Needling, DME instructions, Cryotherapy, and Moist heat   PLAN FOR NEXT  SESSION: Trigger point dry needling to masseter and lateral pterygoid, trigger point dry needling to upper traps and cervical paraspinals.  Soft tissue mobilization upper traps and cervical paraspinals as well as masseter.  Review rows and shoulder extensions.  Reviewed cervical retraction.  Review current HEP and troubleshoot.   Kara Melching O'Shea, Student-PT 01/13/2025, 11:49 AM "

## 2025-01-15 ENCOUNTER — Encounter (HOSPITAL_BASED_OUTPATIENT_CLINIC_OR_DEPARTMENT_OTHER): Payer: Self-pay | Admitting: Physical Therapy

## 2025-01-19 ENCOUNTER — Ambulatory Visit
Admission: RE | Admit: 2025-01-19 | Discharge: 2025-01-19 | Disposition: A | Discharge status: Home / Self Care | Source: Ambulatory Visit | Attending: Obstetrics & Gynecology | Admitting: Obstetrics & Gynecology

## 2025-01-19 DIAGNOSIS — Z1231 Encounter for screening mammogram for malignant neoplasm of breast: Secondary | ICD-10-CM

## 2025-01-31 ENCOUNTER — Ambulatory Visit: Payer: Self-pay | Admitting: Obstetrics & Gynecology

## 2025-02-03 ENCOUNTER — Encounter: Payer: Self-pay | Admitting: Physician Assistant

## 2025-02-03 ENCOUNTER — Ambulatory Visit: Admitting: Physician Assistant

## 2025-02-03 VITALS — BP 109/70 | HR 82

## 2025-02-03 DIAGNOSIS — M62838 Other muscle spasm: Secondary | ICD-10-CM

## 2025-02-03 DIAGNOSIS — G43709 Chronic migraine without aura, not intractable, without status migrainosus: Secondary | ICD-10-CM

## 2025-02-03 MED ORDER — ONABOTULINUMTOXINA 200 UNITS IJ SOLR
200.0000 [IU] | Freq: Once | INTRAMUSCULAR | Status: AC
  Administered 2025-02-03: 200 [IU] via INTRAMUSCULAR

## 2025-02-03 NOTE — Progress Notes (Signed)
 S: Pt in office today for Botox  injections. Her Botox  has been working very well for migraine prevention.    O: BP 109/70   Pulse 82    Botox  Procedure Note Vial of Botox  was : Supplied by Patient   Botox  Dosing by [facility] [facility] for Chronic Migraine   Injection Sites for Migraines  Botox  155 units was injected using the dosage in the table above in the pattern shown above.  45 units wasted  A: Migraine  Muscle spasm   P: Botox  155 units injected today.  RTC 3 Months.

## 2025-02-03 NOTE — Patient Instructions (Signed)
 Treating Facial Lines and Wrinkles With a Shot of Medicine (Botulinum Toxin Cosmetic Injection): What to Expect Botulinum toxin injection is a procedure to soften lines and wrinkles in the face. Botulinum toxin is produced by a bacteria called Clostridium botulinum. The toxin is injected into muscles with a thin needle. The toxin works by blocking nerve impulses to certain muscles. This weakens and paralyzes those muscles for a short time, which reduces the wrinkles when you move your face. Tell a health care provider about: Any allergies you have, especially allergies to eggs or milk. All medicines you are taking, including antibiotics, vitamins, herbs, eye drops, creams, and over-the-counter medicines. Any bleeding problems you have. Any surgeries you have had. Any medical conditions you have. Whether you are pregnant or may be pregnant. Whether you are breastfeeding. What are the risks? Your health care provider will talk with you about risks. These may include: Pain in the neck or jaw. Allergic reaction to the toxin. Infection. Double vision or blurry vision. Drooping of the eyebrow or eyelid. Not being able to close one or both eyes. Changes in voice or speech. Trouble swallowing. What happens before the procedure? Ask your provider about: Changing or stopping your regular medicines. These include any diabetes medicines or blood thinners you take. Taking medicines such as aspirin and ibuprofen. These medicines can thin your blood. Do not take them unless your provider tells you to. Taking over-the-counter medicines, vitamins, herbs, and supplements. Do not drink alcohol if: Your provider tells you not to drink. You are pregnant, may be pregnant, or plan to become pregnant. If you drink alcohol: Limit how much you have to: 0-1 drink a day if you are female. 0-2 drinks a day if you are female. Know how much alcohol is in your drink. In the [place] one drink is one 12 oz bottle of beer  (355 mL), one 5 oz glass of wine (148 mL), or one 1 oz glass of hard liquor (44 mL). What happens during the procedure?  The area to be treated may be: Chilled with ice. Numbed with medicine. The treatment site may have ice applied or you may be given a medicine to numb the area (local anesthetic) to reduce discomfort. Your provider will inject small amounts of the toxin into certain muscles. The number of injections that you get will depend on your specific condition and the area that is being treated. The procedure may vary among providers and hospitals. What can I expect after the procedure? After your procedure, it is common to have: Mild pain, soreness, swelling, itching, or redness around the injection sites. Redness may last for 1-2 days. In rare cases, it may last longer than 2 days. Small bruises around the injection sites. These may go away in a few days. Bumps or marks from the needle. These may go away within a few hours. Brief numbness near the injection sites. Headache. This is rare. The results of your treatment may take up to 14 days to fully show, although many people will see the benefits in 3-5 days after treatment. In most people, the benefits last about 3-4 months. The effectiveness of botulinum toxin will last longer with follow-up treatments. Follow these instructions at home: Managing pain and discomfort  Take over-the-counter and prescription medicines only as told by your provider. If told, put ice on the affected area. Put ice in a plastic bag. Place a towel between your skin and the bag. Leave the ice on for 20 minutes, 2-3 times  per day. If your skin turns bright red, remove the ice right away to prevent skin damage. The risk of damage is higher if you cannot feel pain, heat, or cold. General instructions Do not do activities that take a lot of effort for 12 hours after the procedure or for as long as told by your provider. This includes not lifting heavy  items, not working out, not doing yoga, and not doing activities that increase your heart rate. Do not lie down for 4 hours after treatment or as told by your provider. Do not rub the injected area for at least 4 hours after the procedure. This can spread the botulinum toxin to surrounding muscles. Depending on the injection site: Your provider may ask you to avoid moving the muscles in the injected area. Follow instructions from your provider. Your provider may tell you to frown or squint regularly. You may need to do these facial exercises every 15 minutes for 1 hour after treatment, or as told by your provider. Do not get laser treatments, facials, or facial massages for 1-2 weeks after the procedure or for as long as told by your provider. Contact a health care provider if: You have a headache that gets worse. You have a fever. Your eyelids become droopy or swollen. You have trouble speaking or swallowing. Get help right away if: You develop signs of an allergic reaction. These include: An itchy rash or welts. Wheezing or shortness of breath. Dizziness. These symptoms may be an emergency. Get help right away. Call 911. Do not wait to see if the symptoms will go away. Do not drive yourself to the hospital. This information is not intended to replace advice given to you by your health care provider. Make sure you discuss any questions you have with your health care provider. Document Revised: 10/20/2024 Document Reviewed: 08/25/2022 Elsevier Patient Education  2025 [employer].
